# Patient Record
Sex: Female | Born: 1935
Health system: Southern US, Community
[De-identification: ages and names within clinical notes are randomized; demographics above are authoritative.]

## PROBLEM LIST (undated history)

## (undated) DIAGNOSIS — I10 Essential (primary) hypertension: Secondary | ICD-10-CM

## (undated) DIAGNOSIS — B029 Zoster without complications: Secondary | ICD-10-CM

## (undated) DIAGNOSIS — M1990 Unspecified osteoarthritis, unspecified site: Secondary | ICD-10-CM

## (undated) DIAGNOSIS — E785 Hyperlipidemia, unspecified: Secondary | ICD-10-CM

## (undated) DIAGNOSIS — D631 Anemia in chronic kidney disease: Secondary | ICD-10-CM

## (undated) DIAGNOSIS — N189 Chronic kidney disease, unspecified: Secondary | ICD-10-CM

## (undated) HISTORY — DX: Hyperlipidemia, unspecified: E78.5

## (undated) HISTORY — DX: Unspecified osteoarthritis, unspecified site: M19.90

## (undated) HISTORY — DX: Anemia in chronic kidney disease: D63.1

## (undated) HISTORY — DX: Zoster without complications: B02.9

## (undated) HISTORY — PX: CATARACT EXTRACTION: SUR2

## (undated) HISTORY — DX: Chronic kidney disease, unspecified: N18.9

## (undated) HISTORY — DX: Essential (primary) hypertension: I10

---

## 1997-10-31 ENCOUNTER — Ambulatory Visit (HOSPITAL_COMMUNITY): Admission: RE | Admit: 1997-10-31 | Discharge: 1997-10-31 | Payer: Self-pay | Admitting: Internal Medicine

## 1997-11-21 ENCOUNTER — Ambulatory Visit (HOSPITAL_COMMUNITY): Admission: RE | Admit: 1997-11-21 | Discharge: 1997-11-21 | Payer: Self-pay | Admitting: Internal Medicine

## 1997-11-21 ENCOUNTER — Encounter: Payer: Self-pay | Admitting: Internal Medicine

## 1998-11-28 ENCOUNTER — Ambulatory Visit (HOSPITAL_COMMUNITY): Admission: RE | Admit: 1998-11-28 | Discharge: 1998-11-28 | Payer: Self-pay | Admitting: Internal Medicine

## 1998-11-28 ENCOUNTER — Encounter: Payer: Self-pay | Admitting: Internal Medicine

## 1998-12-21 ENCOUNTER — Encounter: Payer: Self-pay | Admitting: Internal Medicine

## 1998-12-21 ENCOUNTER — Ambulatory Visit (HOSPITAL_COMMUNITY): Admission: RE | Admit: 1998-12-21 | Discharge: 1998-12-21 | Payer: Self-pay | Admitting: Internal Medicine

## 1999-12-26 ENCOUNTER — Encounter: Payer: Self-pay | Admitting: Internal Medicine

## 1999-12-26 ENCOUNTER — Encounter: Admission: RE | Admit: 1999-12-26 | Discharge: 1999-12-26 | Payer: Self-pay | Admitting: Internal Medicine

## 2000-07-28 ENCOUNTER — Other Ambulatory Visit: Admission: RE | Admit: 2000-07-28 | Discharge: 2000-07-28 | Payer: Self-pay | Admitting: Internal Medicine

## 2000-11-23 ENCOUNTER — Encounter: Admission: RE | Admit: 2000-11-23 | Discharge: 2000-12-24 | Payer: Self-pay | Admitting: Internal Medicine

## 2000-12-28 ENCOUNTER — Encounter: Payer: Self-pay | Admitting: Internal Medicine

## 2000-12-28 ENCOUNTER — Encounter: Admission: RE | Admit: 2000-12-28 | Discharge: 2000-12-28 | Payer: Self-pay | Admitting: Internal Medicine

## 2001-12-29 ENCOUNTER — Encounter: Admission: RE | Admit: 2001-12-29 | Discharge: 2001-12-29 | Payer: Self-pay | Admitting: Internal Medicine

## 2001-12-29 ENCOUNTER — Encounter: Payer: Self-pay | Admitting: Internal Medicine

## 2003-01-03 ENCOUNTER — Encounter: Payer: Self-pay | Admitting: Internal Medicine

## 2003-01-03 ENCOUNTER — Encounter: Admission: RE | Admit: 2003-01-03 | Discharge: 2003-01-03 | Payer: Self-pay | Admitting: Internal Medicine

## 2003-10-05 ENCOUNTER — Ambulatory Visit (HOSPITAL_COMMUNITY): Admission: RE | Admit: 2003-10-05 | Discharge: 2003-10-05 | Payer: Self-pay | Admitting: Internal Medicine

## 2004-01-18 ENCOUNTER — Encounter: Admission: RE | Admit: 2004-01-18 | Discharge: 2004-01-18 | Payer: Self-pay | Admitting: Internal Medicine

## 2004-06-12 ENCOUNTER — Ambulatory Visit: Payer: Self-pay | Admitting: Internal Medicine

## 2004-07-01 ENCOUNTER — Ambulatory Visit: Payer: Self-pay | Admitting: Internal Medicine

## 2004-07-23 ENCOUNTER — Ambulatory Visit: Payer: Self-pay | Admitting: Internal Medicine

## 2005-02-06 ENCOUNTER — Encounter: Admission: RE | Admit: 2005-02-06 | Discharge: 2005-02-06 | Payer: Self-pay | Admitting: Internal Medicine

## 2006-02-12 ENCOUNTER — Encounter: Admission: RE | Admit: 2006-02-12 | Discharge: 2006-02-12 | Payer: Self-pay | Admitting: Internal Medicine

## 2007-07-27 ENCOUNTER — Encounter: Admission: RE | Admit: 2007-07-27 | Discharge: 2007-07-27 | Payer: Self-pay | Admitting: Internal Medicine

## 2008-08-31 ENCOUNTER — Encounter: Admission: RE | Admit: 2008-08-31 | Discharge: 2008-08-31 | Payer: Self-pay | Admitting: Internal Medicine

## 2009-06-15 ENCOUNTER — Encounter (INDEPENDENT_AMBULATORY_CARE_PROVIDER_SITE_OTHER): Payer: Self-pay | Admitting: *Deleted

## 2010-01-17 ENCOUNTER — Encounter: Admission: RE | Admit: 2010-01-17 | Discharge: 2010-01-17 | Payer: Self-pay | Admitting: Internal Medicine

## 2010-04-30 NOTE — Letter (Signed)
Summary: Colonoscopy Letter  [facility]  [address] [place], [place] [address]   Phone: [phone]  Fax: [phone]      June 15, 2009 MRN: [id number]   Ann [name] [address] [place], [place]  [address]   Dear [name],   According to your medical record, it is time for you to schedule a Colonoscopy. The American Cancer Society recommends this procedure as a method to detect early colon cancer. Patients with a family history of colon cancer, or a personal history of colon polyps or inflammatory bowel disease are at increased risk.  This letter has beeen generated based on the recommendations made at the time of your procedure. If you feel that in your particular situation this may no longer apply, please contact our office.  Please call our office at [phone] to schedule this appointment or to update your records at your earliest convenience.  Thank you for cooperating with [place] to provide you with the very best care possible.   Sincerely,  Hedwig Morton. Juanda Chance, M.D.  [facility] [phone]

## 2010-05-13 ENCOUNTER — Telehealth: Payer: Self-pay | Admitting: Internal Medicine

## 2010-05-22 NOTE — Progress Notes (Signed)
Summary: Schedule Colonoscopy  Phone Note Outgoing Call Call back at Home Phone [phone]   Call placed by: Harlow Mares CMA Duncan Dull),  May 13, 2010 10:55 AM Call placed to: Patient Summary of Call: pt due for colonoscopy due to personal hx of colon polyps, offered an office visit to the pt and she declined both at this time.  Initial call taken by: Harlow Mares CMA (AAMA),  May 13, 2010 10:55 AM

## 2010-06-25 ENCOUNTER — Other Ambulatory Visit: Payer: Self-pay | Admitting: Oncology

## 2010-06-25 ENCOUNTER — Encounter (HOSPITAL_BASED_OUTPATIENT_CLINIC_OR_DEPARTMENT_OTHER): Payer: Medicare Other | Admitting: Oncology

## 2010-06-25 DIAGNOSIS — N189 Chronic kidney disease, unspecified: Secondary | ICD-10-CM

## 2010-06-25 DIAGNOSIS — D649 Anemia, unspecified: Secondary | ICD-10-CM

## 2010-06-25 LAB — COMPREHENSIVE METABOLIC PANEL
ALT: 18 U/L (ref 0–35)
AST: 25 U/L (ref 0–37)
Albumin: 4.5 g/dL (ref 3.5–5.2)
Alkaline Phosphatase: 62 U/L (ref 39–117)
BUN: 24 mg/dL — ABNORMAL HIGH (ref 6–23)
CO2: 29 mEq/L (ref 19–32)
Calcium: 9.9 mg/dL (ref 8.4–10.5)
Chloride: 102 mEq/L (ref 96–112)
Creatinine, Ser: 1.23 mg/dL — ABNORMAL HIGH (ref 0.40–1.20)
Glucose, Bld: 118 mg/dL — ABNORMAL HIGH (ref 70–99)
Potassium: 4.3 mEq/L (ref 3.5–5.3)
Sodium: 139 mEq/L (ref 135–145)
Total Protein: 7.5 g/dL (ref 6.0–8.3)

## 2010-06-25 LAB — CBC & DIFF AND RETIC
BASO%: 0.4 % (ref 0.0–2.0)
EOS%: 1.4 % (ref 0.0–7.0)
Eosinophils Absolute: 0.1 10*3/uL (ref 0.0–0.5)
HCT: 35.1 % (ref 34.8–46.6)
Immature Retic Fract: 5.3 % (ref 0.00–10.70)
LYMPH%: 36.5 % (ref 14.0–49.7)
MCH: 27.5 pg (ref 25.1–34.0)
MCHC: 32.5 g/dL (ref 31.5–36.0)
MCV: 84.6 fL (ref 79.5–101.0)
MONO#: 0.4 10*3/uL (ref 0.1–0.9)
MONO%: 8.4 % (ref 0.0–14.0)
NEUT#: 2.7 10*3/uL (ref 1.5–6.5)
NEUT%: 53.3 % (ref 38.4–76.8)
Platelets: 190 10*3/uL (ref 145–400)
RDW: 14.9 % — ABNORMAL HIGH (ref 11.2–14.5)
Retic %: 0.76 % (ref 0.50–1.50)
Retic Ct Abs: 31.54 10*3/uL (ref 18.30–72.70)
WBC: 5.1 10*3/uL (ref 3.9–10.3)
lymph#: 1.9 10*3/uL (ref 0.9–3.3)

## 2010-06-25 LAB — MORPHOLOGY: PLT EST: ADEQUATE

## 2010-06-25 LAB — CHCC SMEAR

## 2010-06-27 ENCOUNTER — Other Ambulatory Visit: Payer: Self-pay | Admitting: Oncology

## 2010-06-27 LAB — FERRITIN: Ferritin: 93 ng/mL (ref 10–291)

## 2010-06-27 LAB — BETA 2 MICROGLOBULIN, SERUM: Beta-2 Microglobulin: 2.39 mg/L — ABNORMAL HIGH (ref 1.01–1.73)

## 2010-06-27 LAB — IRON AND TIBC
Iron: 70 ug/dL (ref 42–145)
UIBC: 281 ug/dL

## 2010-07-01 ENCOUNTER — Other Ambulatory Visit: Payer: Self-pay | Admitting: Oncology

## 2010-07-01 LAB — UIFE/LIGHT CHAINS/TP QN, 24-HR UR
Albumin, U: DETECTED
Alpha 1, Urine: DETECTED — AB
Alpha 2, Urine: DETECTED — AB
Beta, Urine: DETECTED — AB
Free Kappa Lt Chains,Ur: 0.18 mg/dL (ref 0.04–1.51)
Free Lambda Lt Chains,Ur: <0.04 mg/dL — ABNORMAL LOW (ref 0.08–1.01)
Free Lt Chn Excr Rate: 2.7 mg/d
Time: 24 hours
Total Protein, Urine-Ur/day: 11 mg/d (ref 10–140)
Volume, Urine: 1500 mL

## 2010-07-01 LAB — CREATININE CLEARANCE, URINE, 24 HOUR
Collection Interval-CRCL: 24 hours
Creatinine Clearance: 57 mL/min — ABNORMAL LOW (ref 75–115)
Creatinine, 24H Ur: 1002 mg/d (ref 700–1800)
Creatinine, Urine: 66.8 mg/dL
Creatinine: 1.23 mg/dL — ABNORMAL HIGH (ref 0.40–1.20)
Urine Total Volume-CRCL: 1500 mL

## 2010-07-04 LAB — PROTEIN ELECTROPHORESIS, SERUM
Albumin ELP: 61.5 % (ref 55.8–66.1)
Alpha-1-Globulin: 4.5 % (ref 2.9–4.9)
Alpha-2-Globulin: 11.2 % (ref 7.1–11.8)
Beta 2: 4.1 % (ref 3.2–6.5)
Beta Globulin: 7 % (ref 4.7–7.2)
Gamma Globulin: 11.7 % (ref 11.1–18.8)
Total Protein, Serum Electrophoresis: 7.3 g/dL (ref 6.0–8.3)

## 2010-09-25 ENCOUNTER — Encounter (HOSPITAL_BASED_OUTPATIENT_CLINIC_OR_DEPARTMENT_OTHER): Payer: Medicare Other | Admitting: Oncology

## 2010-09-25 ENCOUNTER — Other Ambulatory Visit: Payer: Self-pay | Admitting: Oncology

## 2010-09-25 DIAGNOSIS — N189 Chronic kidney disease, unspecified: Secondary | ICD-10-CM

## 2010-09-25 DIAGNOSIS — D649 Anemia, unspecified: Secondary | ICD-10-CM

## 2010-09-25 LAB — CBC WITH DIFFERENTIAL/PLATELET
BASO%: 0.3 % (ref 0.0–2.0)
HCT: 32.9 % — ABNORMAL LOW (ref 34.8–46.6)
HGB: 10.8 g/dL — ABNORMAL LOW (ref 11.6–15.9)
LYMPH%: 31.5 % (ref 14.0–49.7)
MCH: 28.3 pg (ref 25.1–34.0)
MCHC: 32.9 g/dL (ref 31.5–36.0)
MCV: 85.9 fL (ref 79.5–101.0)
MONO#: 0.3 10*3/uL (ref 0.1–0.9)
MONO%: 6.6 % (ref 0.0–14.0)
NEUT%: 60.4 % (ref 38.4–76.8)
Platelets: 164 10*3/uL (ref 145–400)
RBC: 3.83 10*6/uL (ref 3.70–5.45)
WBC: 5.1 10*3/uL (ref 3.9–10.3)
lymph#: 1.6 10*3/uL (ref 0.9–3.3)

## 2010-12-30 ENCOUNTER — Other Ambulatory Visit: Payer: Self-pay | Admitting: Oncology

## 2010-12-30 ENCOUNTER — Encounter (HOSPITAL_BASED_OUTPATIENT_CLINIC_OR_DEPARTMENT_OTHER): Payer: Medicare Other | Admitting: Oncology

## 2010-12-30 DIAGNOSIS — D649 Anemia, unspecified: Secondary | ICD-10-CM

## 2010-12-30 DIAGNOSIS — N189 Chronic kidney disease, unspecified: Secondary | ICD-10-CM

## 2010-12-30 LAB — CBC WITH DIFFERENTIAL/PLATELET
BASO%: 0.4 % (ref 0.0–2.0)
EOS%: 1.7 % (ref 0.0–7.0)
Eosinophils Absolute: 0.1 10*3/uL (ref 0.0–0.5)
HCT: 32.6 % — ABNORMAL LOW (ref 34.8–46.6)
HGB: 11 g/dL — ABNORMAL LOW (ref 11.6–15.9)
LYMPH%: 34.4 % (ref 14.0–49.7)
MCH: 28.6 pg (ref 25.1–34.0)
MCHC: 33.7 g/dL (ref 31.5–36.0)
MCV: 85.1 fL (ref 79.5–101.0)
MONO%: 8 % (ref 0.0–14.0)
NEUT%: 55.5 % (ref 38.4–76.8)
Platelets: 182 10*3/uL (ref 145–400)
RBC: 3.83 10*6/uL (ref 3.70–5.45)
RDW: 15.3 % — ABNORMAL HIGH (ref 11.2–14.5)
WBC: 4.3 10*3/uL (ref 3.9–10.3)
lymph#: 1.5 10*3/uL (ref 0.9–3.3)

## 2010-12-30 LAB — COMPREHENSIVE METABOLIC PANEL
ALT: 13 U/L (ref 0–35)
AST: 21 U/L (ref 0–37)
BUN: 32 mg/dL — ABNORMAL HIGH (ref 6–23)
CO2: 28 mEq/L (ref 19–32)
Calcium: 10 mg/dL (ref 8.4–10.5)
Chloride: 104 mEq/L (ref 96–112)
Creatinine, Ser: 1.41 mg/dL — ABNORMAL HIGH (ref 0.50–1.10)
Glucose, Bld: 101 mg/dL — ABNORMAL HIGH (ref 70–99)
Potassium: 5 mEq/L (ref 3.5–5.3)
Sodium: 140 mEq/L (ref 135–145)
Total Bilirubin: 0.5 mg/dL (ref 0.3–1.2)
Total Protein: 7 g/dL (ref 6.0–8.3)

## 2011-01-09 ENCOUNTER — Other Ambulatory Visit: Payer: Self-pay | Admitting: Internal Medicine

## 2011-01-09 DIAGNOSIS — Z1231 Encounter for screening mammogram for malignant neoplasm of breast: Secondary | ICD-10-CM

## 2011-02-19 ENCOUNTER — Ambulatory Visit
Admission: RE | Admit: 2011-02-19 | Discharge: 2011-02-19 | Disposition: A | Discharge status: Home / Self Care | Payer: Medicare Other | Source: Ambulatory Visit | Attending: Internal Medicine | Admitting: Internal Medicine

## 2011-02-19 DIAGNOSIS — Z1231 Encounter for screening mammogram for malignant neoplasm of breast: Secondary | ICD-10-CM

## 2011-03-17 ENCOUNTER — Other Ambulatory Visit (HOSPITAL_BASED_OUTPATIENT_CLINIC_OR_DEPARTMENT_OTHER): Payer: Medicare Other | Admitting: Lab

## 2011-03-17 ENCOUNTER — Other Ambulatory Visit: Payer: Self-pay | Admitting: Oncology

## 2011-03-17 DIAGNOSIS — D649 Anemia, unspecified: Secondary | ICD-10-CM

## 2011-03-17 DIAGNOSIS — N189 Chronic kidney disease, unspecified: Secondary | ICD-10-CM

## 2011-03-17 LAB — CBC WITH DIFFERENTIAL/PLATELET
BASO%: 0.6 % (ref 0.0–2.0)
Basophils Absolute: 0 10*3/uL (ref 0.0–0.1)
EOS%: 1.1 % (ref 0.0–7.0)
Eosinophils Absolute: 0.1 10*3/uL (ref 0.0–0.5)
HCT: 33.6 % — ABNORMAL LOW (ref 34.8–46.6)
HGB: 11.2 g/dL — ABNORMAL LOW (ref 11.6–15.9)
LYMPH%: 36.3 % (ref 14.0–49.7)
MCH: 28.4 pg (ref 25.1–34.0)
MCHC: 33.4 g/dL (ref 31.5–36.0)
MCV: 84.8 fL (ref 79.5–101.0)
MONO#: 0.4 10*3/uL (ref 0.1–0.9)
MONO%: 7.9 % (ref 0.0–14.0)
NEUT#: 2.6 10*3/uL (ref 1.5–6.5)
NEUT%: 54.1 % (ref 38.4–76.8)
Platelets: 168 10*3/uL (ref 145–400)
RBC: 3.96 10*6/uL (ref 3.70–5.45)
RDW: 15.3 % — ABNORMAL HIGH (ref 11.2–14.5)
WBC: 4.7 10*3/uL (ref 3.9–10.3)
lymph#: 1.7 10*3/uL (ref 0.9–3.3)

## 2011-03-20 NOTE — Progress Notes (Signed)
Called patient to inform her that her Hgb is stable, per Dr. Gaylyn Rong.

## 2011-03-29 ENCOUNTER — Telehealth: Payer: Self-pay | Admitting: Oncology

## 2011-03-29 NOTE — Telephone Encounter (Signed)
Not able to reach pt by phone or lm. Mailed pt appt schedule for jan thru July today.

## 2011-04-10 ENCOUNTER — Other Ambulatory Visit: Payer: Medicare Other | Admitting: Lab

## 2011-05-27 ENCOUNTER — Encounter: Payer: Self-pay | Admitting: *Deleted

## 2011-06-05 ENCOUNTER — Encounter: Payer: Self-pay | Admitting: Oncology

## 2011-06-05 DIAGNOSIS — N183 Chronic kidney disease, stage 3 unspecified: Secondary | ICD-10-CM | POA: Insufficient documentation

## 2011-06-05 DIAGNOSIS — I10 Essential (primary) hypertension: Secondary | ICD-10-CM | POA: Insufficient documentation

## 2011-06-05 DIAGNOSIS — D631 Anemia in chronic kidney disease: Secondary | ICD-10-CM | POA: Insufficient documentation

## 2011-06-05 DIAGNOSIS — M1990 Unspecified osteoarthritis, unspecified site: Secondary | ICD-10-CM | POA: Insufficient documentation

## 2011-06-05 DIAGNOSIS — E559 Vitamin D deficiency, unspecified: Secondary | ICD-10-CM | POA: Insufficient documentation

## 2011-06-05 DIAGNOSIS — E782 Mixed hyperlipidemia: Secondary | ICD-10-CM | POA: Insufficient documentation

## 2011-06-11 ENCOUNTER — Telehealth: Payer: Self-pay | Admitting: Oncology

## 2011-06-11 ENCOUNTER — Ambulatory Visit (HOSPITAL_BASED_OUTPATIENT_CLINIC_OR_DEPARTMENT_OTHER): Payer: Medicare Other | Admitting: Oncology

## 2011-06-11 ENCOUNTER — Other Ambulatory Visit (HOSPITAL_BASED_OUTPATIENT_CLINIC_OR_DEPARTMENT_OTHER): Payer: Medicare Other | Admitting: Lab

## 2011-06-11 VITALS — BP 122/60 | HR 59 | Temp 97.2°F | Ht 62.0 in | Wt 117.8 lb

## 2011-06-11 DIAGNOSIS — D649 Anemia, unspecified: Secondary | ICD-10-CM

## 2011-06-11 DIAGNOSIS — E785 Hyperlipidemia, unspecified: Secondary | ICD-10-CM

## 2011-06-11 DIAGNOSIS — I10 Essential (primary) hypertension: Secondary | ICD-10-CM

## 2011-06-11 DIAGNOSIS — N189 Chronic kidney disease, unspecified: Secondary | ICD-10-CM

## 2011-06-11 DIAGNOSIS — M1990 Unspecified osteoarthritis, unspecified site: Secondary | ICD-10-CM

## 2011-06-11 DIAGNOSIS — D631 Anemia in chronic kidney disease: Secondary | ICD-10-CM

## 2011-06-11 LAB — CBC WITH DIFFERENTIAL/PLATELET
BASO%: 0.7 % (ref 0.0–2.0)
HCT: 32.9 % — ABNORMAL LOW (ref 34.8–46.6)
HGB: 10.8 g/dL — ABNORMAL LOW (ref 11.6–15.9)
MCHC: 32.8 g/dL (ref 31.5–36.0)
MONO#: 0.4 10*3/uL (ref 0.1–0.9)
NEUT#: 2.9 10*3/uL (ref 1.5–6.5)
NEUT%: 62.1 % (ref 38.4–76.8)
WBC: 4.6 10*3/uL (ref 3.9–10.3)
lymph#: 1.3 10*3/uL (ref 0.9–3.3)

## 2011-06-11 LAB — COMPREHENSIVE METABOLIC PANEL
ALT: 13 U/L (ref 0–35)
CO2: 26 mEq/L (ref 19–32)
Calcium: 9.7 mg/dL (ref 8.4–10.5)
Chloride: 100 mEq/L (ref 96–112)
Creatinine, Ser: 1.35 mg/dL — ABNORMAL HIGH (ref 0.50–1.10)
Sodium: 137 mEq/L (ref 135–145)
Total Protein: 7.1 g/dL (ref 6.0–8.3)

## 2011-06-11 NOTE — Progress Notes (Signed)
[facility] OFFICE PROGRESS NOTE  Cc:  Nadean Corwin, MD, MD  DIAGNOSIS:  Normocytic anemia from dyserythropoiesis and chronic renal insufficiency.  CURRENT THERAPY:  watchful observation.  INTERVAL HISTORY: Ann [name] 78 y.o. female returns for regular follow up.  She reports feeling well.  She denies any symptom of anemia.  She denies visible source of bleeding, SOB, palpitation, CP, dizziness.  Patient denies fatigue, headache, visual changes, confusion, drenching night sweats, palpable lymph node swelling, mucositis, odynophagia, dysphagia, nausea vomiting, jaundice, chest pain, palpitation, shortness of breath, dyspnea on exertion, productive cough, gum bleeding, epistaxis, hematemesis, hemoptysis, abdominal pain, abdominal swelling, early satiety, melena, hematochezia, hematuria, skin rash, spontaneous bleeding, joint swelling, joint pain, heat or cold intolerance, bowel bladder incontinence, back pain, focal motor weakness, paresthesia, depression, suicidal or homocidal ideation, feeling hopelessness.   Past Medical History  Diagnosis Date  . HTN (hypertension)   . Hyperlipidemia   . Vitamin d deficiency   . Degenerative joint disease   . Shingles   . Chronic renal insufficiency   . Anemia of chronic renal failure     No past surgical history on file.  Current Outpatient Prescriptions  Medication Sig Dispense Refill  . aspirin 81 MG tablet Take 81 mg by mouth daily.      Marland Kitchen atorvastatin (LIPITOR) 80 MG tablet Take 80 mg by mouth daily.      . Cholecalciferol (VITAMIN D3) 2000 UNITS TABS Take by mouth.      . docusate sodium (COLACE) 100 MG capsule Take 100 mg by mouth 2 (two) times daily.      . enalapril (VASOTEC) 20 MG tablet Take 20 mg by mouth daily.      . ferrous sulfate 325 (65 FE) MG tablet Take 325 mg by mouth daily with breakfast.      . fish oil-omega-3 fatty acids 1000 MG capsule Take 2 g by mouth daily.      . Flaxseed, Linseed, 1200  MG CAPS Take 1,200 mg by mouth 2 (two) times daily.      . furosemide (LASIX) 20 MG tablet Take 20 mg by mouth daily. 1/2 tablet      . ibuprofen (ADVIL) 200 MG tablet Take 200 mg by mouth every 6 (six) hours as needed.      . Magnesium 500 MG TABS Take by mouth daily.      . Multiple Vitamins-Minerals (CENTRUM SILVER PO) Take by mouth daily.      . vitamin B-12 (CYANOCOBALAMIN) 250 MCG tablet Take 250 mcg by mouth daily.        ALLERGIES:   has no known allergies.  REVIEW OF SYSTEMS:  The rest of the 14-point review of system was negative.   Filed Vitals:   06/11/11 1129  BP: 122/60  Pulse: 59  Temp: 97.2 F (36.2 C)   Wt Readings from Last 3 Encounters:  06/11/11 117 lb 12.8 oz (53.434 kg)  12/30/10 117 lb 3.2 oz (53.162 kg)   ECOG Performance status: 0  PHYSICAL EXAMINATION:   General:  Thin-appearing woman in no acute distress.  Eyes:  no scleral icterus.  ENT:  There were no oropharyngeal lesions.  Neck was without thyromegaly.  Lymphatics:  Negative cervical, supraclavicular or axillary adenopathy.  Respiratory: lungs were clear bilaterally without wheezing or crackles.  Cardiovascular:  Regular rate and rhythm, S1/S2, without murmur, rub or gallop.  There was no pedal edema.  GI:  abdomen was soft, flat, nontender, nondistended, without organomegaly.  Muscoloskeletal:  no spinal tenderness of palpation of vertebral spine.  Skin exam was without echymosis, petichae.  Neuro exam was nonfocal.  Patient was able to get on and off exam table without assistance.  Gait was normal.  Patient was alerted and oriented.  Attention was good.   Language was appropriate.  Mood was normal without depression.  Speech was not pressured.  Thought content was not tangential.     LABORATORY/RADIOLOGY DATA:  Lab Results  Component Value Date   WBC 4.6 06/11/2011   HGB 10.8* 06/11/2011   HCT 32.9* 06/11/2011   PLT 181 06/11/2011   GLUCOSE 101* 12/30/2010   GLUCOSE 101* 12/30/2010   ALT 13 12/30/2010    ALT 13 12/30/2010   AST 21 12/30/2010   AST 21 12/30/2010   NA 140 12/30/2010   NA 140 12/30/2010   K 5.0 12/30/2010   K 5.0 12/30/2010   CL 104 12/30/2010   CL 104 12/30/2010   CREATININE 1.41* 12/30/2010   CREATININE 1.41* 12/30/2010   BUN 32* 12/30/2010   BUN 32* 12/30/2010   CO2 28 12/30/2010   CO2 28 12/30/2010    ASSESSMENT AND PLAN:   1. Hypertension well controlled on enalapril per primary care physician. 2. Hyperlipidemia.  She is on atorvastatin per primary care physician. 3. Chronic renal insufficiency most likely secondary to her history of chronic hypertension.  She was ruled out for myeloma in the past.  4. Chronic normocytic anemia.  Her Hgb today is stable.  This is most likely again due to anemia of CKD.  There is no indication for pRBC transfusion.  In the future, if she requires frequent pRBC transfusion, then we may consider Aranesp.   5. Follow up:  Lab-only appointment in about 6 months.  RV with me in about 1 year.   The length of time of the face-to-face encounter was 10 minutes. More than 50% of time was spent counseling and coordination of care.

## 2011-06-11 NOTE — Telephone Encounter (Signed)
appts made and printed for pt aom °

## 2011-07-10 ENCOUNTER — Other Ambulatory Visit: Payer: Medicare Other | Admitting: Lab

## 2011-10-06 ENCOUNTER — Other Ambulatory Visit: Payer: Medicare Other

## 2011-10-06 ENCOUNTER — Ambulatory Visit: Payer: Medicare Other | Admitting: Oncology

## 2011-12-11 ENCOUNTER — Telehealth: Payer: Self-pay | Admitting: *Deleted

## 2011-12-11 ENCOUNTER — Other Ambulatory Visit (HOSPITAL_BASED_OUTPATIENT_CLINIC_OR_DEPARTMENT_OTHER): Payer: Medicare Other | Admitting: Lab

## 2011-12-11 DIAGNOSIS — M1990 Unspecified osteoarthritis, unspecified site: Secondary | ICD-10-CM

## 2011-12-11 DIAGNOSIS — D631 Anemia in chronic kidney disease: Secondary | ICD-10-CM

## 2011-12-11 DIAGNOSIS — N189 Chronic kidney disease, unspecified: Secondary | ICD-10-CM

## 2011-12-11 DIAGNOSIS — I10 Essential (primary) hypertension: Secondary | ICD-10-CM

## 2011-12-11 DIAGNOSIS — E785 Hyperlipidemia, unspecified: Secondary | ICD-10-CM

## 2011-12-11 LAB — CBC WITH DIFFERENTIAL/PLATELET
BASO%: 0.8 % (ref 0.0–2.0)
MCHC: 33.1 g/dL (ref 31.5–36.0)
MONO#: 0.4 10*3/uL (ref 0.1–0.9)
RBC: 3.94 10*6/uL (ref 3.70–5.45)
RDW: 14.7 % — ABNORMAL HIGH (ref 11.2–14.5)
WBC: 4.2 10*3/uL (ref 3.9–10.3)
lymph#: 1.5 10*3/uL (ref 0.9–3.3)

## 2011-12-11 NOTE — Telephone Encounter (Signed)
Called pt w/ results of CBC, anemia stable and keep next appt in March 2014 as scheduled.  Pt verbalized understanding.

## 2011-12-11 NOTE — Telephone Encounter (Signed)
Message copied by Wende Mott on Thu Dec 11, 2011  3:25 PM ------      Message from: HA, Raliegh Ip T      Created: Thu Dec 11, 2011  9:07 AM       Please call patient. Her anemia of chronic kidney disease is stable. I again recommend observation.

## 2012-03-03 ENCOUNTER — Other Ambulatory Visit: Payer: Self-pay | Admitting: Internal Medicine

## 2012-03-03 DIAGNOSIS — Z1231 Encounter for screening mammogram for malignant neoplasm of breast: Secondary | ICD-10-CM

## 2012-04-19 ENCOUNTER — Ambulatory Visit
Admission: RE | Admit: 2012-04-19 | Discharge: 2012-04-19 | Disposition: A | Discharge status: Home / Self Care | Payer: Medicare Other | Source: Ambulatory Visit | Attending: Internal Medicine | Admitting: Internal Medicine

## 2012-04-19 DIAGNOSIS — Z1231 Encounter for screening mammogram for malignant neoplasm of breast: Secondary | ICD-10-CM

## 2012-04-19 LAB — HM MAMMOGRAPHY: HM MAMMO: NEGATIVE

## 2012-05-20 ENCOUNTER — Telehealth: Payer: Self-pay | Admitting: Oncology

## 2012-05-20 NOTE — Telephone Encounter (Signed)
no answer,...mailed pt updated appt schedule for March

## 2012-06-11 ENCOUNTER — Ambulatory Visit: Payer: Medicare Other | Admitting: Oncology

## 2012-06-11 ENCOUNTER — Other Ambulatory Visit: Payer: Medicare Other | Admitting: Lab

## 2012-06-24 ENCOUNTER — Other Ambulatory Visit: Payer: Self-pay | Admitting: Oncology

## 2012-06-24 DIAGNOSIS — D631 Anemia in chronic kidney disease: Secondary | ICD-10-CM

## 2012-06-24 DIAGNOSIS — N189 Chronic kidney disease, unspecified: Secondary | ICD-10-CM

## 2012-06-25 ENCOUNTER — Telehealth: Payer: Self-pay | Admitting: Oncology

## 2012-06-25 ENCOUNTER — Other Ambulatory Visit (HOSPITAL_BASED_OUTPATIENT_CLINIC_OR_DEPARTMENT_OTHER): Payer: Medicare Other | Admitting: Lab

## 2012-06-25 ENCOUNTER — Ambulatory Visit (HOSPITAL_BASED_OUTPATIENT_CLINIC_OR_DEPARTMENT_OTHER): Payer: Medicare Other | Admitting: Oncology

## 2012-06-25 VITALS — BP 147/68 | HR 63 | Temp 97.1°F | Resp 18 | Ht 62.0 in | Wt 113.1 lb

## 2012-06-25 DIAGNOSIS — N189 Chronic kidney disease, unspecified: Secondary | ICD-10-CM

## 2012-06-25 DIAGNOSIS — E785 Hyperlipidemia, unspecified: Secondary | ICD-10-CM

## 2012-06-25 DIAGNOSIS — D631 Anemia in chronic kidney disease: Secondary | ICD-10-CM

## 2012-06-25 DIAGNOSIS — D649 Anemia, unspecified: Secondary | ICD-10-CM

## 2012-06-25 DIAGNOSIS — I10 Essential (primary) hypertension: Secondary | ICD-10-CM

## 2012-06-25 LAB — COMPREHENSIVE METABOLIC PANEL (CC13)
ALT: 10 U/L (ref 0–55)
AST: 18 U/L (ref 5–34)
Albumin: 3.9 g/dL (ref 3.5–5.0)
Alkaline Phosphatase: 66 U/L (ref 40–150)
Glucose: 94 mg/dl (ref 70–99)
Potassium: 4.3 mEq/L (ref 3.5–5.1)
Sodium: 140 mEq/L (ref 136–145)
Total Protein: 6.8 g/dL (ref 6.4–8.3)

## 2012-06-25 LAB — CBC WITH DIFFERENTIAL/PLATELET
BASO%: 0.9 % (ref 0.0–2.0)
Eosinophils Absolute: 0.1 10*3/uL (ref 0.0–0.5)
MCV: 85.3 fL (ref 79.5–101.0)
MONO%: 8.4 % (ref 0.0–14.0)
NEUT#: 2.5 10*3/uL (ref 1.5–6.5)
RBC: 4.17 10*6/uL (ref 3.70–5.45)
RDW: 14.1 % (ref 11.2–14.5)
WBC: 4.4 10*3/uL (ref 3.9–10.3)

## 2012-06-25 NOTE — Progress Notes (Signed)
[facility] OFFICE PROGRESS NOTE  Cc:  MCKEOWN,WILLIAM DAVID, MD  DIAGNOSIS:  Normocytic anemia from dyserythropoiesis and chronic renal insufficiency.  CURRENT THERAPY:  watchful observation.  INTERVAL HISTORY: Ann [name] 79 y.o. female returns for regular follow up.  She reports feeling great.  She denied SOB, DOE, visible source of bleeding.   The rest of the 14-point review of system was negative.   Past Medical History  Diagnosis Date  . HTN (hypertension)   . Hyperlipidemia   . Vitamin d deficiency   . Degenerative joint disease   . Shingles   . Chronic renal insufficiency   . Anemia of chronic renal failure     No past surgical history on file.  Current Outpatient Prescriptions  Medication Sig Dispense Refill  . aspirin 81 MG tablet Take 81 mg by mouth daily.      . Biotin 2500 MCG CAPS Take by mouth daily.      . Cholecalciferol (VITAMIN D3) 2000 UNITS TABS Take by mouth.      . enalapril (VASOTEC) 20 MG tablet Take 10 mg by mouth daily.       . ferrous sulfate 325 (65 FE) MG tablet Take 180 mg by mouth daily with breakfast.       . fish oil-omega-3 fatty acids 1000 MG capsule Take 1 g by mouth daily. 1400 mg      . simvastatin (ZOCOR) 40 MG tablet Take 40 mg by mouth every evening.       No current facility-administered medications for this visit.    ALLERGIES:  has No Known Allergies.  REVIEW OF SYSTEMS:  The rest of the 14-point review of system was negative.   Filed Vitals:   06/25/12 0940  BP: 147/68  Pulse: 63  Temp: 97.1 F (36.2 C)  Resp: 18   Wt Readings from Last 3 Encounters:  06/25/12 113 lb 1.6 oz (51.302 kg)  06/11/11 117 lb 12.8 oz (53.434 kg)  12/30/10 117 lb 3.2 oz (53.162 kg)   ECOG Performance status: 0  PHYSICAL EXAMINATION:   General:  Thin-appearing woman in no acute distress.  Eyes:  no scleral icterus.  ENT:  There were no oropharyngeal lesions.  Neck was without thyromegaly.  Lymphatics:  Negative  cervical, supraclavicular or axillary adenopathy.  Respiratory: lungs were clear bilaterally without wheezing or crackles.  Cardiovascular:  Regular rate and rhythm, S1/S2, without murmur, rub or gallop.  There was no pedal edema.  GI:  abdomen was soft, flat, nontender, nondistended, without organomegaly.  Muscoloskeletal:  no spinal tenderness of palpation of vertebral spine.  Skin exam was without echymosis, petichae.  Neuro exam was nonfocal.  Patient was able to get on and off exam table without assistance.  Gait was normal.  Patient was alerted and oriented.  Attention was good.   Language was appropriate.  Mood was normal without depression.  Speech was not pressured.  Thought content was not tangential.     LABORATORY/RADIOLOGY DATA:  Lab Results  Component Value Date   WBC 4.4 06/25/2012   HGB 11.5* 06/25/2012   HCT 35.5 06/25/2012   PLT 150 06/25/2012   GLUCOSE 94 06/25/2012   ALT 10 06/25/2012   AST 18 06/25/2012   NA 140 06/25/2012   K 4.3 06/25/2012   CL 105 06/25/2012   CREATININE 1.2* 06/25/2012   BUN 20.3 06/25/2012   CO2 25 06/25/2012    ASSESSMENT AND PLAN:   1. Hypertension:  On enalapril 2. Hyperlipidemia.  She is on simvastatin and fish oil. 3. Chronic renal insufficiency most likely secondary to her history of chronic hypertension.  She was ruled out for myeloma in the past. Her kidney function has been stable. 4. Chronic normocytic anemia.  Most like be due to chronic kidney disease. Past workup was negative including multiple myeloma. There is no indication for pRBC transfusion.  In the future, if she requires frequent pRBC transfusion, then we may consider Aranesp.   5. Follow up:  Lab-only appointment in about 6 months.  Return visit in about 1 year.   The length of time of the face-to-face encounter was 10 minutes. More than 50% of time was spent counseling and coordination of care.

## 2012-06-25 NOTE — Patient Instructions (Addendum)
1.  diagnosis: Anemia of chronic kidney disease. 2.  status: Stable. 3.  recommendation: Observation with routine followup of blood counts. Return visit in one year. In the future, if anemia significantly worsens or also developed low white count and platelet count, we will consider diagnostic bone marrow biopsy.

## 2012-08-04 ENCOUNTER — Encounter: Payer: Medicare Other | Admitting: Internal Medicine

## 2012-12-24 ENCOUNTER — Other Ambulatory Visit (HOSPITAL_BASED_OUTPATIENT_CLINIC_OR_DEPARTMENT_OTHER): Payer: Medicare Other

## 2012-12-24 DIAGNOSIS — D631 Anemia in chronic kidney disease: Secondary | ICD-10-CM

## 2012-12-24 DIAGNOSIS — D649 Anemia, unspecified: Secondary | ICD-10-CM

## 2012-12-24 DIAGNOSIS — N189 Chronic kidney disease, unspecified: Secondary | ICD-10-CM

## 2012-12-24 LAB — CBC WITH DIFFERENTIAL/PLATELET
Basophils Absolute: 0 10*3/uL (ref 0.0–0.1)
Eosinophils Absolute: 0.1 10*3/uL (ref 0.0–0.5)
HCT: 36.5 % (ref 34.8–46.6)
HGB: 11.7 g/dL (ref 11.6–15.9)
MONO#: 0.4 10*3/uL (ref 0.1–0.9)
NEUT#: 2.2 10*3/uL (ref 1.5–6.5)
NEUT%: 56.2 % (ref 38.4–76.8)
WBC: 3.9 10*3/uL (ref 3.9–10.3)
lymph#: 1.2 10*3/uL (ref 0.9–3.3)

## 2012-12-31 ENCOUNTER — Telehealth: Payer: Self-pay | Admitting: *Deleted

## 2012-12-31 NOTE — Telephone Encounter (Signed)
Spoke to pt, she verbalized understanding.  SLJ 

## 2012-12-31 NOTE — Telephone Encounter (Signed)
Message copied by Caren Griffins on Fri Dec 31, 2012  4:15 PM ------      Message from: Centuria, Virginia E      Created: Fri Dec 31, 2012  3:51 PM       Will you please help            ----- Message -----         From: Myrtis Ser, NP         Sent: 12/31/2012  10:30 AM           To: Alvis Lemmings, RN            Please call pt. Hgb is normal. Keep f/u as scheduled. ------

## 2013-04-03 ENCOUNTER — Encounter: Payer: Self-pay | Admitting: Internal Medicine

## 2013-04-06 ENCOUNTER — Ambulatory Visit (INDEPENDENT_AMBULATORY_CARE_PROVIDER_SITE_OTHER): Payer: Medicare Other | Admitting: Emergency Medicine

## 2013-04-06 ENCOUNTER — Encounter: Payer: Self-pay | Admitting: Emergency Medicine

## 2013-04-06 VITALS — BP 122/60 | HR 62 | Temp 97.8°F | Resp 16 | Ht 62.0 in | Wt 111.0 lb

## 2013-04-06 DIAGNOSIS — J309 Allergic rhinitis, unspecified: Secondary | ICD-10-CM

## 2013-04-06 DIAGNOSIS — E559 Vitamin D deficiency, unspecified: Secondary | ICD-10-CM

## 2013-04-06 DIAGNOSIS — R7309 Other abnormal glucose: Secondary | ICD-10-CM

## 2013-04-06 DIAGNOSIS — I10 Essential (primary) hypertension: Secondary | ICD-10-CM

## 2013-04-06 DIAGNOSIS — E782 Mixed hyperlipidemia: Secondary | ICD-10-CM

## 2013-04-06 LAB — CBC WITH DIFFERENTIAL/PLATELET
Basophils Absolute: 0 10*3/uL (ref 0.0–0.1)
Basophils Relative: 1 % (ref 0–1)
EOS ABS: 0 10*3/uL (ref 0.0–0.7)
Eosinophils Relative: 1 % (ref 0–5)
HEMATOCRIT: 35.2 % — AB (ref 36.0–46.0)
HEMOGLOBIN: 11.7 g/dL — AB (ref 12.0–15.0)
Lymphocytes Relative: 25 % (ref 12–46)
Lymphs Abs: 1.1 10*3/uL (ref 0.7–4.0)
MCH: 27 pg (ref 26.0–34.0)
MCHC: 33.2 g/dL (ref 30.0–36.0)
MCV: 81.1 fL (ref 78.0–100.0)
MONO ABS: 0.3 10*3/uL (ref 0.1–1.0)
MONOS PCT: 7 % (ref 3–12)
NEUTROS ABS: 2.7 10*3/uL (ref 1.7–7.7)
Neutrophils Relative %: 66 % (ref 43–77)
Platelets: 151 10*3/uL (ref 150–400)
RBC: 4.34 MIL/uL (ref 3.87–5.11)
RDW: 15.2 % (ref 11.5–15.5)
WBC: 4.1 10*3/uL (ref 4.0–10.5)

## 2013-04-06 LAB — HEPATIC FUNCTION PANEL
ALBUMIN: 4.2 g/dL (ref 3.5–5.2)
ALK PHOS: 54 U/L (ref 39–117)
ALT: 9 U/L (ref 0–35)
AST: 20 U/L (ref 0–37)
Bilirubin, Direct: 0.1 mg/dL (ref 0.0–0.3)
Indirect Bilirubin: 0.3 mg/dL (ref 0.0–0.9)
TOTAL PROTEIN: 6.8 g/dL (ref 6.0–8.3)
Total Bilirubin: 0.4 mg/dL (ref 0.3–1.2)

## 2013-04-06 LAB — LIPID PANEL
CHOLESTEROL: 160 mg/dL (ref 0–200)
HDL: 48 mg/dL (ref >39)
LDL Cholesterol: 91 mg/dL (ref 0–99)
TRIGLYCERIDES: 106 mg/dL (ref <150)
Total CHOL/HDL Ratio: 3.3 Ratio
VLDL: 21 mg/dL (ref 0–40)

## 2013-04-06 LAB — BASIC METABOLIC PANEL WITH GFR
BUN: 20 mg/dL (ref 6–23)
CO2: 25 meq/L (ref 19–32)
Calcium: 9.4 mg/dL (ref 8.4–10.5)
Chloride: 104 mEq/L (ref 96–112)
Creat: 1.07 mg/dL (ref 0.50–1.10)
GFR, EST AFRICAN AMERICAN: 57 mL/min — AB
GFR, Est Non African American: 49 mL/min — ABNORMAL LOW
GLUCOSE: 97 mg/dL (ref 70–99)
Potassium: 5.9 mEq/L — ABNORMAL HIGH (ref 3.5–5.3)
SODIUM: 139 meq/L (ref 135–145)

## 2013-04-06 LAB — HEMOGLOBIN A1C
Hgb A1c MFr Bld: 6.1 % — ABNORMAL HIGH (ref <5.7)
Mean Plasma Glucose: 128 mg/dL — ABNORMAL HIGH (ref <117)

## 2013-04-06 MED ORDER — ALPRAZOLAM 1 MG PO TABS: 1.0000 mg | ORAL_TABLET | Freq: Three times a day (TID) | ORAL | Status: DC | PRN

## 2013-04-06 NOTE — Patient Instructions (Signed)
Allergic Rhinitis Allergic rhinitis is when the mucous membranes in the nose respond to allergens. Allergens are particles in the air that cause your body to have an allergic reaction. This causes you to release allergic antibodies. Through a chain of events, these eventually cause you to release histamine into the blood stream (hence the use of antihistamines). Although meant to be protective to the body, it is this release that causes your discomfort, such as frequent sneezing, congestion and an itchy runny nose.  CAUSES  The pollen allergens may come from grasses, trees, and weeds. This is seasonal allergic rhinitis, or "hay fever." Other allergens cause year-round allergic rhinitis (perennial allergic rhinitis) such as house dust mite allergen, pet dander and mold spores.  SYMPTOMS   Nasal stuffiness (congestion).  Runny, itchy nose with sneezing and tearing of the eyes.  There is often an itching of the mouth, eyes and ears. It cannot be cured, but it can be controlled with medications. DIAGNOSIS  If you are unable to determine the offending allergen, skin or blood testing may find it. TREATMENT   Avoid the allergen.  Medications and allergy shots (immunotherapy) can help.  Hay fever may often be treated with antihistamines in pill or nasal spray forms. Antihistamines block the effects of histamine. There are over-the-counter medicines that may help with nasal congestion and swelling around the eyes. Check with your caregiver before taking or giving this medicine. If the treatment above does not work, there are many new medications your caregiver can prescribe. Stronger medications may be used if initial measures are ineffective. Desensitizing injections can be used if medications and avoidance fails. Desensitization is when a patient is given ongoing shots until the body becomes less sensitive to the allergen. Make sure you follow up with your caregiver if problems continue. SEEK MEDICAL  CARE IF:   You develop fever (more than 100.5 F (38.1 C).  You develop a cough that does not stop easily (persistent).  You have shortness of breath.  You start wheezing.  Symptoms interfere with normal daily activities. Document Released: 12/10/2000 Document Revised: 06/09/2011 Document Reviewed: 06/21/2008 Exit[employer] Patient Information 2014 [employer].  

## 2013-04-06 NOTE — Progress Notes (Signed)
Subjective:    Patient ID: Ann [name], female    DOB: May 09, 1930, 80 y.o.   MRN: [id number]  HPI Comments: 80 YO female presents for 3 month F/U for HTN, Cholesterol, Pre-Dm, D. deficient LAST LABS T 189 TG 88 LDL 106 A1C 6.1 INSULIN 7 D 69 She is eating healthy. She keeps active.  She has been sick for over 1 week with cough and fatigue. She notes symptoms are starting to improve.   She notes she occasionally a little more anxious with  Mild stresses and it occasionally disturbs sleep. She uses Xanax to help without any adverse side effects. She wakes well rested and alert. She denies any recent falls or confusion.  Hypertension  Hyperlipidemia    Current Outpatient Prescriptions on File Prior to Visit  Medication Sig Dispense Refill  . ALPRAZolam (XANAX) 1 MG tablet Take 1 mg by mouth at bedtime.      Marland Kitchen. aspirin 81 MG tablet Take 81 mg by mouth daily.      . Biotin 2500 MCG CAPS Take by mouth daily.      . Cholecalciferol (VITAMIN D3) 2000 UNITS TABS Take by mouth.      . enalapril (VASOTEC) 20 MG tablet Take 10 mg by mouth daily.       . fish oil-omega-3 fatty acids 1000 MG capsule Take 1 g by mouth daily. 1400 mg      . simvastatin (ZOCOR) 40 MG tablet Take 40 mg by mouth every evening.       No current facility-administered medications on file prior to visit.   Review of patient's allergies indicates no known allergies.  Past Medical History  Diagnosis Date  . HTN (hypertension)   . Hyperlipidemia   . Vitamin D deficiency   . Degenerative joint disease   . Shingles   . Chronic renal insufficiency   . Anemia of chronic renal failure      Review of Systems  HENT: Positive for congestion and postnasal drip.   Psychiatric/Behavioral: Positive for sleep disturbance. The patient is nervous/anxious.   All other systems reviewed and are negative.   BP 122/60  Pulse 62  Temp(Src) 97.8 F (36.6 C) (Temporal)  Resp 16  Ht 5\' 2"  (1.575 m)  Wt 111 lb (50.349 kg)   BMI 20.30 kg/m2     Objective:   Physical Exam  Nursing note and vitals reviewed. Constitutional: She is oriented to person, place, and time. She appears well-developed and well-nourished. No distress.  HENT:  Head: Normocephalic and atraumatic.  Right Ear: External ear normal.  Left Ear: External ear normal.  Nose: Nose normal.  Mouth/Throat: Oropharynx is clear and moist. No oropharyngeal exudate.  Eyes: Conjunctivae and EOM are normal.  Neck: Normal range of motion. Neck supple. No JVD present. No thyromegaly present.  Cardiovascular: Normal rate, regular rhythm, normal heart sounds and intact distal pulses.   Pulmonary/Chest: Effort normal and breath sounds normal.  Abdominal: Soft. Bowel sounds are normal. She exhibits no distension and no mass. There is no tenderness. There is no rebound and no guarding.  Musculoskeletal: Normal range of motion. She exhibits no edema and no tenderness.  Lymphadenopathy:    She has no cervical adenopathy.  Neurological: She is alert and oriented to person, place, and time. No cranial nerve deficit.  Skin: Skin is warm and dry. No rash noted. No erythema. No pallor.  Psychiatric: She has a normal mood and affect. Her behavior is normal. Judgment and thought content  normal.          Assessment & Plan:  1.  3 month F/U for HTN, Cholesterol, Pre-Dm, D. Deficient. Needs healthy diet, cardio QD and obtain healthy weight. Check Labs, Check BP if >130/80 call office 2. Allergic rhinitis- Allegra OTC, increase H2o, allergy hygiene explained vs viral infection resolving,w/c if symptoms increase 3. Anxiety/ Insomnia occasional- Advised will increase RX to TID but should rarely use it more than once a day, she w/c if needs RX more than BID, sleep hygiene discussed

## 2013-04-07 LAB — INSULIN, FASTING: Insulin fasting, serum: 16 u[IU]/mL (ref 3–28)

## 2013-04-28 ENCOUNTER — Other Ambulatory Visit: Payer: Self-pay | Admitting: Internal Medicine

## 2013-05-03 ENCOUNTER — Other Ambulatory Visit: Payer: Self-pay | Admitting: Internal Medicine

## 2013-06-22 ENCOUNTER — Other Ambulatory Visit: Payer: Self-pay | Admitting: Hematology and Oncology

## 2013-06-22 DIAGNOSIS — N189 Chronic kidney disease, unspecified: Principal | ICD-10-CM

## 2013-06-22 DIAGNOSIS — D631 Anemia in chronic kidney disease: Secondary | ICD-10-CM

## 2013-06-23 ENCOUNTER — Other Ambulatory Visit (HOSPITAL_BASED_OUTPATIENT_CLINIC_OR_DEPARTMENT_OTHER): Payer: Medicare Other

## 2013-06-23 ENCOUNTER — Ambulatory Visit (HOSPITAL_BASED_OUTPATIENT_CLINIC_OR_DEPARTMENT_OTHER): Payer: Medicare Other | Admitting: Hematology and Oncology

## 2013-06-23 ENCOUNTER — Encounter: Payer: Self-pay | Admitting: Hematology and Oncology

## 2013-06-23 VITALS — BP 129/49 | HR 69 | Temp 98.1°F | Resp 18 | Ht 62.0 in | Wt 113.5 lb

## 2013-06-23 DIAGNOSIS — N189 Chronic kidney disease, unspecified: Secondary | ICD-10-CM

## 2013-06-23 DIAGNOSIS — D631 Anemia in chronic kidney disease: Secondary | ICD-10-CM

## 2013-06-23 DIAGNOSIS — D649 Anemia, unspecified: Secondary | ICD-10-CM

## 2013-06-23 LAB — CBC & DIFF AND RETIC
BASO%: 0.9 % (ref 0.0–2.0)
Basophils Absolute: 0 10*3/uL (ref 0.0–0.1)
EOS ABS: 0.1 10*3/uL (ref 0.0–0.5)
EOS%: 2 % (ref 0.0–7.0)
HEMATOCRIT: 35.6 % (ref 34.8–46.6)
HGB: 11.1 g/dL — ABNORMAL LOW (ref 11.6–15.9)
Immature Retic Fract: 6.8 % (ref 1.60–10.00)
LYMPH%: 25.9 % (ref 14.0–49.7)
MCH: 26.8 pg (ref 25.1–34.0)
MCHC: 31.2 g/dL — ABNORMAL LOW (ref 31.5–36.0)
MCV: 86 fL (ref 79.5–101.0)
MONO#: 0.3 10*3/uL (ref 0.1–0.9)
MONO%: 6.5 % (ref 0.0–14.0)
NEUT%: 64.7 % (ref 38.4–76.8)
NEUTROS ABS: 3 10*3/uL (ref 1.5–6.5)
PLATELETS: 182 10*3/uL (ref 145–400)
RBC: 4.14 10*6/uL (ref 3.70–5.45)
RDW: 14.8 % — ABNORMAL HIGH (ref 11.2–14.5)
Retic %: 1.15 % (ref 0.70–2.10)
Retic Ct Abs: 47.61 10*3/uL (ref 33.70–90.70)
WBC: 4.6 10*3/uL (ref 3.9–10.3)
lymph#: 1.2 10*3/uL (ref 0.9–3.3)

## 2013-06-23 NOTE — Progress Notes (Signed)
[facility] OFFICE PROGRESS NOTE  MCKEOWN,WILLIAM DAVID, MD DIAGNOSIS:  Chronic anemia, likely anemia of chronic disease from chronic kidney disease  SUMMARY OF HEMATOLOGIC HISTORY: This patient was referred here because of anemia chronic disease. Show chronic kidney disease, currently under observation. She had been placed on oral iron supplement last year but she is not currently taking it. INTERVAL HISTORY: Dekota Kirlin Verbeke 80 y.o. female returns for further followup. She denies any signs and symptoms of anemia. The patient is very active with all activities of daily living. The patient denies any recent signs or symptoms of bleeding such as spontaneous epistaxis, hematuria or hematochezia.  I have reviewed the past medical history, past surgical history, social history and family history with the patient and they are unchanged from previous note.  ALLERGIES:  has No Known Allergies.  MEDICATIONS:  Current Outpatient Prescriptions  Medication Sig Dispense Refill  . ALPRAZolam (XANAX) 1 MG tablet Take 1 tablet (1 mg total) by mouth 3 (three) times daily as needed for anxiety (for sleep and occasional anxiety).  90 tablet  0  . aspirin 81 MG tablet Take 81 mg by mouth daily.      . Biotin 2500 MCG CAPS Take by mouth daily.      . Cholecalciferol (VITAMIN D3) 2000 UNITS TABS Take by mouth.      Marland Kitchen CINNAMON PO Take 2,000 mg by mouth daily.      . enalapril (VASOTEC) 20 MG tablet Take one tablet by mouth every day  for blood pressure  90 tablet  0  . fish oil-omega-3 fatty acids 1000 MG capsule Take 1 g by mouth daily. 1400 mg      . simvastatin (ZOCOR) 40 MG tablet Take one tablet by mouth   nightly at bedtime  90 tablet  2   No current facility-administered medications for this visit.     REVIEW OF SYSTEMS:   Constitutional: Denies fevers, chills or night sweats Eyes: Denies blurriness of vision Ears, nose, mouth, throat, and face: Denies mucositis or sore  throat Respiratory: Denies cough, dyspnea or wheezes Cardiovascular: Denies palpitation, chest discomfort or lower extremity swelling Gastrointestinal:  Denies nausea, heartburn or change in bowel habits Skin: Denies abnormal skin rashes Lymphatics: Denies new lymphadenopathy or easy bruising Neurological:Denies numbness, tingling or new weaknesses Behavioral/Psych: Mood is stable, no new changes  All other systems were reviewed with the patient and are negative.  PHYSICAL EXAMINATION: ECOG PERFORMANCE STATUS: 0 - Asymptomatic  Filed Vitals:   06/23/13 0903  BP: 129/49  Pulse: 69  Temp: 98.1 F (36.7 C)  Resp: 18   Filed Weights   06/23/13 0903  Weight: 113 lb 8 oz (51.483 kg)    GENERAL:alert, no distress and comfortable SKIN: skin color, texture, turgor are normal, no rashes or significant lesions EYES: normal, Conjunctiva are pink and non-injected, sclera clear OROPHARYNX:no exudate, no erythema and lips, buccal mucosa, and tongue normal  NECK: supple, thyroid normal size, non-tender, without nodularity LYMPH:  no palpable lymphadenopathy in the cervical, axillary or inguinal LUNGS: clear to auscultation and percussion with normal breathing effort HEART: regular rate & rhythm and no murmurs and no lower extremity edema ABDOMEN:abdomen soft, non-tender and normal bowel sounds Musculoskeletal:no cyanosis of digits and no clubbing  NEURO: alert & oriented x 3 with fluent speech, no focal motor/sensory deficits  LABORATORY DATA:  I have reviewed the data as listed Results for orders placed in visit on 06/23/13 (from the past 48 hour(s))  CBC & DIFF AND RETIC     Status: Abnormal   Collection Time    06/23/13  8:52 AM      Result Value Ref Range   WBC 4.6  3.9 - 10.3 10e3/uL   NEUT# 3.0  1.5 - 6.5 10e3/uL   HGB 11.1 (*) 11.6 - 15.9 g/dL   HCT 16.135.6  09.634.8 - 04.546.6 %   Platelets 182  145 - 400 10e3/uL   MCV 86.0  79.5 - 101.0 fL   MCH 26.8  25.1 - 34.0 pg   MCHC 31.2 (*)  31.5 - 36.0 g/dL   RBC 4.094.14  8.113.70 - 9.145.45 10e6/uL   RDW 14.8 (*) 11.2 - 14.5 %   lymph# 1.2  0.9 - 3.3 10e3/uL   MONO# 0.3  0.1 - 0.9 10e3/uL   Eosinophils Absolute 0.1  0.0 - 0.5 10e3/uL   Basophils Absolute 0.0  0.0 - 0.1 10e3/uL   NEUT% 64.7  38.4 - 76.8 %   LYMPH% 25.9  14.0 - 49.7 %   MONO% 6.5  0.0 - 14.0 %   EOS% 2.0  0.0 - 7.0 %   BASO% 0.9  0.0 - 2.0 %   Retic % 1.15  0.70 - 2.10 %   Retic Ct Abs 47.61  33.70 - 90.70 10e3/uL   Immature Retic Fract 6.80  1.60 - 10.00 %    Lab Results  Component Value Date   WBC 4.6 06/23/2013   HGB 11.1* 06/23/2013   HCT 35.6 06/23/2013   MCV 86.0 06/23/2013   PLT 182 06/23/2013   ASSESSMENT & PLAN:  #1 chronic anemia #2 chronic kidney disease This is likely anemia of chronic disease. The patient denies recent history of bleeding such as epistaxis, hematuria or hematochezia. She is asymptomatic from the anemia. We will observe for now.  There is no indication to start her on erythropoietin stimulating agents unless her hemoglobin dropped to less than 10 g. I recommend she continue followup with her primary care provider for blood count monitoring. If her hemoglobin dropped to less than 10 g, I will be happy to see the patient back in the clinic for further discussion about the use of erythropoietin stimulating agents. I will discharge patient from the clinic. All questions were answered. The patient knows to call the clinic with any problems, questions or concerns. No barriers to learning was detected.  I spent 15 minutes counseling the patient face to face. The total time spent in the appointment was 20 minutes and more than 50% was on counseling.     [facility] TxGORSUCH, Rebel Willcutt, MD 06/23/2013 9:24 AM

## 2013-06-27 ENCOUNTER — Ambulatory Visit (INDEPENDENT_AMBULATORY_CARE_PROVIDER_SITE_OTHER): Payer: Medicare Other | Admitting: Internal Medicine

## 2013-06-27 ENCOUNTER — Encounter: Payer: Self-pay | Admitting: Internal Medicine

## 2013-06-27 VITALS — BP 132/76 | HR 64 | Temp 97.9°F | Resp 18 | Ht 62.0 in | Wt 113.6 lb

## 2013-06-27 DIAGNOSIS — Z1331 Encounter for screening for depression: Secondary | ICD-10-CM

## 2013-06-27 DIAGNOSIS — Z1212 Encounter for screening for malignant neoplasm of rectum: Secondary | ICD-10-CM

## 2013-06-27 DIAGNOSIS — Z789 Other specified health status: Secondary | ICD-10-CM

## 2013-06-27 DIAGNOSIS — E785 Hyperlipidemia, unspecified: Secondary | ICD-10-CM

## 2013-06-27 DIAGNOSIS — E559 Vitamin D deficiency, unspecified: Secondary | ICD-10-CM

## 2013-06-27 DIAGNOSIS — Z79899 Other long term (current) drug therapy: Secondary | ICD-10-CM | POA: Insufficient documentation

## 2013-06-27 DIAGNOSIS — I10 Essential (primary) hypertension: Secondary | ICD-10-CM

## 2013-06-27 DIAGNOSIS — R7309 Other abnormal glucose: Secondary | ICD-10-CM

## 2013-06-27 DIAGNOSIS — Z0000 Encounter for general adult medical examination without abnormal findings: Secondary | ICD-10-CM

## 2013-06-27 LAB — CBC WITH DIFFERENTIAL/PLATELET
Basophils Absolute: 0 10*3/uL (ref 0.0–0.1)
Basophils Relative: 1 % (ref 0–1)
Eosinophils Absolute: 0.1 10*3/uL (ref 0.0–0.7)
Eosinophils Relative: 2 % (ref 0–5)
HEMATOCRIT: 34.2 % — AB (ref 36.0–46.0)
HEMOGLOBIN: 11.1 g/dL — AB (ref 12.0–15.0)
LYMPHS ABS: 1 10*3/uL (ref 0.7–4.0)
LYMPHS PCT: 26 % (ref 12–46)
MCH: 27.2 pg (ref 26.0–34.0)
MCHC: 32.5 g/dL (ref 30.0–36.0)
MCV: 83.8 fL (ref 78.0–100.0)
MONO ABS: 0.3 10*3/uL (ref 0.1–1.0)
MONOS PCT: 8 % (ref 3–12)
NEUTROS ABS: 2.5 10*3/uL (ref 1.7–7.7)
Neutrophils Relative %: 63 % (ref 43–77)
Platelets: 207 10*3/uL (ref 150–400)
RBC: 4.08 MIL/uL (ref 3.87–5.11)
RDW: 15.2 % (ref 11.5–15.5)
WBC: 3.9 10*3/uL — AB (ref 4.0–10.5)

## 2013-06-27 NOTE — Progress Notes (Signed)
Patient ID: Ann [name], female   DOB: 04-Sep-1930, 80 y.o.   MRN: [id number]   Annual Screening Comprehensive Examination  This very nice 80 y.o. [facility] presents for complete physical.  Patient has been followed for HTN, Diabetes  Prediabetes, Hyperlipidemia, and Vitamin D Deficiency.    HTN predates since 1997. Patient's BP has been controlled at home. Today's BP: 132/76 mmHg. Patient has Stage III CKD (GFR 49 ml/min) with associated Anemia of Chronic Disease. Patient denies any cardiac symptoms as chest pain, palpitations, shortness of breath, dizziness or ankle swelling.   Patient's hyperlipidemia is controlled with diet and medications. Patient denies myalgias or other medication SE's. Last cholesterol last visit was 160, triglycerides 106, HDL 48 and LDL 91 in Jan 2014 - all at goal.     Patient has prediabetes with A1c 6.1% in Oct 2010 and with last A1c 6.1% in Jan 2015. Patient denies reactive hypoglycemic symptoms, visual blurring, diabetic polys, or paresthesias.    Finally, patient has history of Vitamin D Deficiency of 25 in 2008 with last vitamin D 69 in Oct 2014.  Medication Sig  . ALPRAZolam (XANAX) 1 MG tablet Take 1 tablet TID PRN  . aspirin 81 MG tablet Take 81 mg by mouth daily.  . Biotin 2500 MCG CAPS Take by mouth daily.  . Cholecalciferol (VITAMIN D3) 2000 UNITS TABS Take daily  . CINNAMON PO Take 2,000 mg  daily.  . enalapril (VASOTEC) 20 MG tablet Take one tablet  every day  for blood pressure  . fish oil-omega-3 fatty acids 1000 MG capsule Take 1 gdaily. 1400 mg  . simvastatin (ZOCOR) 40 MG tablet Take one tablet  nightly at bedtime   No Known Allergies  Past Medical History  Diagnosis Date  . HTN (hypertension)   . Hyperlipidemia   . Vitamin D deficiency   . Degenerative joint disease   . Shingles   . Chronic renal insufficiency   . Anemia of chronic renal failure     History reviewed. No pertinent past surgical history.  Family History  Problem  Relation Age of Onset  . CVA Father   . Cancer Daughter     breast    History  Substance Use Topics  . Smoking status: Never Smoker   . Smokeless tobacco: Never Used  . Alcohol Use: 0.6 oz/week    1 Glasses of wine per week     Comment: occasional wine    ROS Constitutional: Denies fever, chills, weight loss/gain, headaches, insomnia, fatigue, night sweats, and change in appetite. Eyes: Denies redness, blurred vision, diplopia, discharge, itchy, watery eyes.  ENT: Denies discharge, congestion, post nasal drip, epistaxis, sore throat, earache, hearing loss, dental pain, Tinnitus, Vertigo, Sinus pain, snoring.  Cardio: Denies chest pain, palpitations, irregular heartbeat, syncope, dyspnea, diaphoresis, orthopnea, PND, claudication, edema Respiratory: denies cough, dyspnea, DOE, pleurisy, hoarseness, laryngitis, wheezing.  Gastrointestinal: Denies dysphagia, heartburn, reflux, water brash, pain, cramps, nausea, vomiting, bloating, diarrhea, constipation, hematemesis, melena, hematochezia, jaundice, hemorrhoids Genitourinary: Denies dysuria, frequency, urgency, nocturia, hesitancy, discharge, hematuria, flank pain Breast:Breast lumps, nipple discharge, bleeding.  Musculoskeletal: Denies arthralgia, myalgia, stiffness, Jt. Swelling, pain, limp, and strain/sprain. Skin: Denies puritis, rash, hives, warts, acne, eczema, changing in skin lesion Neuro: No weakness, tremor, incoordination, spasms, paresthesia, pain Psychiatric: Denies confusion, memory loss, sensory loss Endocrine: Denies change in weight, skin, hair change, nocturia, and paresthesia, diabetic polys, visual blurring, hyper / hypo glycemic episodes.  Heme/Lymph: No excessive bleeding, bruising, enlarged lymph nodes.   Physical Exam  BP 132/76  Pulse 64  Temp(Src) 97.9 F (36.6 C) (Temporal)  Resp 18  Ht 5\' 2"  (1.575 m)  Wt 113 lb 9.6 oz (51.529 kg)  BMI 20.77 kg/m2  General Appearance: Well nourished, in no apparent  distress. Eyes: PERRLA, EOMs, conjunctiva no swelling or erythema, normal fundi and vessels. Sinuses: No frontal/maxillary tenderness ENT/Mouth: EACs patent / TMs  nl. Nares clear without erythema, swelling, mucoid exudates. Oral hygiene is good. No erythema, swelling, or exudate. Tongue normal, non-obstructing. Tonsils not swollen or erythematous. Hearing normal.  Neck: Supple, thyroid normal. No bruits, nodes or JVD. Respiratory: Respiratory effort normal.  BS equal and clear bilateral without rales, rhonci, wheezing or stridor. Cardio: Heart sounds are normal with regular rate and rhythm and no murmurs, rubs or gallops. Peripheral pulses are normal and equal bilaterally without edema. No aortic or femoral bruits. Chest: symmetric with normal excursions and percussion. Breasts: Symmetric, without lumps, nipple discharge, retractions, or fibrocystic changes.  Abdomen: Flat, soft, with bowl sounds. Nontender, no guarding, rebound, hernias, masses, or organomegaly.  Lymphatics: Non tender without lymphadenopathy.  Genitourinary:  Musculoskeletal: Full ROM all peripheral extremities, joint stability, 5/5 strength, and normal gait. Skin: Warm and dry without rashes, lesions, cyanosis, clubbing or  ecchymosis.  Neuro: Cranial nerves intact, reflexes equal bilaterally. Normal muscle tone, no cerebellar symptoms. Sensation intact.  Pysch: Awake and oriented X 3, normal affect, Insight and Judgment appropriate.   Assessment and Plan  1. Annual Screening Examination 2. Hypertension  3. Hyperlipidemia 4. Pre Diabetes 5. Vitamin D Deficiency  Continue prudent diet as discussed, weight control, BP monitoring, regular exercise, and medications. Discussed med's effects and SE's. Screening labs and tests as requested with regular follow-up as recommended.

## 2013-06-27 NOTE — Patient Instructions (Signed)

## 2013-06-28 LAB — HEMOGLOBIN A1C
Hgb A1c MFr Bld: 5.9 % — ABNORMAL HIGH (ref <5.7)
MEAN PLASMA GLUCOSE: 123 mg/dL — AB (ref <117)

## 2013-06-28 LAB — BASIC METABOLIC PANEL WITH GFR
BUN: 26 mg/dL — ABNORMAL HIGH (ref 6–23)
CHLORIDE: 100 meq/L (ref 96–112)
CO2: 26 mEq/L (ref 19–32)
Calcium: 9.8 mg/dL (ref 8.4–10.5)
Creat: 1.13 mg/dL — ABNORMAL HIGH (ref 0.50–1.10)
GFR, EST NON AFRICAN AMERICAN: 46 mL/min — AB
GFR, Est African American: 53 mL/min — ABNORMAL LOW
GLUCOSE: 92 mg/dL (ref 70–99)
POTASSIUM: 5.1 meq/L (ref 3.5–5.3)
Sodium: 140 mEq/L (ref 135–145)

## 2013-06-28 LAB — LIPID PANEL
Cholesterol: 170 mg/dL (ref 0–200)
HDL: 63 mg/dL (ref >39)
LDL CALC: 90 mg/dL (ref 0–99)
TRIGLYCERIDES: 83 mg/dL (ref <150)
Total CHOL/HDL Ratio: 2.7 Ratio
VLDL: 17 mg/dL (ref 0–40)

## 2013-06-28 LAB — MAGNESIUM: Magnesium: 1.9 mg/dL (ref 1.5–2.5)

## 2013-06-28 LAB — MICROALBUMIN / CREATININE URINE RATIO
CREATININE, URINE: 101.3 mg/dL
MICROALB UR: 0.5 mg/dL (ref 0.00–1.89)
MICROALB/CREAT RATIO: 4.9 mg/g (ref 0.0–30.0)

## 2013-06-28 LAB — HEPATIC FUNCTION PANEL
ALK PHOS: 62 U/L (ref 39–117)
ALT: 9 U/L (ref 0–35)
AST: 17 U/L (ref 0–37)
Albumin: 4.2 g/dL (ref 3.5–5.2)
Bilirubin, Direct: 0.1 mg/dL (ref 0.0–0.3)
Indirect Bilirubin: 0.3 mg/dL (ref 0.2–1.2)
TOTAL PROTEIN: 6.9 g/dL (ref 6.0–8.3)
Total Bilirubin: 0.4 mg/dL (ref 0.2–1.2)

## 2013-06-28 LAB — TSH: TSH: 1.761 u[IU]/mL (ref 0.350–4.500)

## 2013-06-28 LAB — VITAMIN D 25 HYDROXY (VIT D DEFICIENCY, FRACTURES): Vit D, 25-Hydroxy: 59 ng/mL (ref 30–89)

## 2013-06-28 LAB — INSULIN, FASTING: Insulin fasting, serum: 12 u[IU]/mL (ref 3–28)

## 2013-07-12 ENCOUNTER — Other Ambulatory Visit (INDEPENDENT_AMBULATORY_CARE_PROVIDER_SITE_OTHER): Payer: Medicare Other

## 2013-07-12 DIAGNOSIS — Z1212 Encounter for screening for malignant neoplasm of rectum: Secondary | ICD-10-CM

## 2013-07-12 LAB — POC HEMOCCULT BLD/STL (HOME/3-CARD/SCREEN)
FECAL OCCULT BLD: NEGATIVE
FECAL OCCULT BLD: NEGATIVE
FECAL OCCULT BLD: NEGATIVE

## 2013-09-29 ENCOUNTER — Encounter: Payer: Self-pay | Admitting: Physician Assistant

## 2013-09-29 ENCOUNTER — Ambulatory Visit (INDEPENDENT_AMBULATORY_CARE_PROVIDER_SITE_OTHER): Payer: Medicare Other | Admitting: Physician Assistant

## 2013-09-29 VITALS — BP 102/60 | HR 60 | Temp 98.1°F | Resp 16 | Wt 112.0 lb

## 2013-09-29 DIAGNOSIS — Z1331 Encounter for screening for depression: Secondary | ICD-10-CM

## 2013-09-29 DIAGNOSIS — E785 Hyperlipidemia, unspecified: Secondary | ICD-10-CM

## 2013-09-29 DIAGNOSIS — Z79899 Other long term (current) drug therapy: Secondary | ICD-10-CM

## 2013-09-29 DIAGNOSIS — E559 Vitamin D deficiency, unspecified: Secondary | ICD-10-CM

## 2013-09-29 DIAGNOSIS — N183 Chronic kidney disease, stage 3 unspecified: Secondary | ICD-10-CM

## 2013-09-29 DIAGNOSIS — R7303 Prediabetes: Secondary | ICD-10-CM

## 2013-09-29 DIAGNOSIS — Z789 Other specified health status: Secondary | ICD-10-CM

## 2013-09-29 DIAGNOSIS — I10 Essential (primary) hypertension: Secondary | ICD-10-CM

## 2013-09-29 DIAGNOSIS — Z0000 Encounter for general adult medical examination without abnormal findings: Secondary | ICD-10-CM

## 2013-09-29 NOTE — Patient Instructions (Signed)

## 2013-09-29 NOTE — Progress Notes (Signed)
MEDICARE ANNUAL WELLNESS VISIT AND FOLLOW UP  Assessment:   1. Essential hypertension - CBC with Differential - BASIC METABOLIC PANEL WITH GFR - Hepatic function panel - TSH  2. Hyperlipidemia - Lipid panel  3. Encounter for long-term (current) use of other medications - Magnesium  4. Vitamin D Deficiency  5. CKD Stage III  (GFR 49 ml/min) Check BMP, continue ACE  6. Prediabetes Discussed general issues about diabetes pathophysiology and management., Educational material distributed., Suggested low cholesterol diet., Encouraged aerobic exercise., Discussed foot care., Reminded to get yearly retinal exam. - Hemoglobin A1c - HM DIABETES FOOT EXAM   Plan:   During the course of the visit the patient was educated and counseled about appropriate screening and preventive services including:    Pneumococcal vaccine   Influenza vaccine  Td vaccine  Screening electrocardiogram  Screening mammography  Bone densitometry screening  Colorectal cancer screening  Diabetes screening  Glaucoma screening  Nutrition counseling   Advanced directives: given info/requested  Screening recommendations, referrals:  Vaccinations: Tdap vaccine not indicated Influenza vaccine not indicated Pneumococcal vaccine not indicated Shingles vaccine declined Hep B vaccine not indicated  Nutrition assessed and recommended  Colonoscopy declined Mammogram requested Pap smear not indicated Pelvic exam not indicated Recommended yearly ophthalmology/optometry visit for glaucoma screening and checkup Recommended yearly dental visit for hygiene and checkup Advanced directives - declined  Conditions/risks identified: BMI: Discussed weight loss, diet, and increase physical activity.  Increase physical activity: AHA recommends 150 minutes of physical activity a week.  Medications reviewed DEXA- undecided, long discussion about risk of osteoporosis and decrease in quality of life and  increase in mortality after breaking a hip, she is very low risk fall but encouraged getting 1 screen DEXA Diabetes is at goal, ACE/ARB therapy: Yes. Urinary Incontinence is not an issue: discussed non pharmacology and pharmacology options.  Fall risk: low- discussed PT, home fall assessment, medications.    Subjective:   Ann [name] is a 80 y.o. female who presents for Medicare Annual Wellness Visit and 3 month follow up on hypertension, prediabetes, hyperlipidemia, vitamin D def.  Date of last medicare wellness visit is unknown.   Her blood pressure has been controlled at home, today their BP is BP: 102/60 mmHg She does workout, mows her law, walks a mile in the morning, and does stationary bike. She denies chest pain, shortness of breath, dizziness.  She is on cholesterol medication and denies myalgias. Her cholesterol is at goal. The cholesterol last visit was:   Lab Results  Component Value Date   CHOL 170 06/27/2013   HDL 63 06/27/2013   LDLCALC 90 06/27/2013   TRIG 83 06/27/2013   CHOLHDL 2.7 06/27/2013   She has been working on diet and exercise for prediabetes, and denies paresthesia of the feet, polydipsia and polyuria. Last A1C in the office was:  Lab Results  Component Value Date   HGBA1C 5.9* 06/27/2013   Patient is on Vitamin D supplement. Lab Results  Component Value Date   VD25OH 2659 06/27/2013      Names of Other Physician/Practitioners you currently use: 1. [facility]- here for primary care 2. Dr. Elmer PickerHecker eye doctor, last visit 8 months ago Patient Care Team: Lucky CowboyWilliam McKeown, MD as PCP - General (Internal Medicine) Dr. Juanda ChanceBrodie, GI  Medication Review Current Outpatient Prescriptions on File Prior to Visit  Medication Sig Dispense Refill  . ALPRAZolam (XANAX) 1 MG tablet Take 1 tablet (1 mg total) by mouth 3 (three)  times daily as needed for anxiety (for sleep and occasional anxiety).  90 tablet  0  . aspirin 81 MG tablet  Take 81 mg by mouth daily.      . Biotin 2500 MCG CAPS Take by mouth daily.      . Cholecalciferol (VITAMIN D3) 2000 UNITS TABS Take by mouth.      Marland Kitchen. CINNAMON PO Take 2,000 mg by mouth daily.      . enalapril (VASOTEC) 20 MG tablet Take one tablet by mouth every day  for blood pressure  90 tablet  0  . fish oil-omega-3 fatty acids 1000 MG capsule Take 1 g by mouth daily. 1400 mg      . simvastatin (ZOCOR) 40 MG tablet Take one tablet by mouth   nightly at bedtime  90 tablet  2   No current facility-administered medications on file prior to visit.    Current Problems (verified) Patient Active Problem List   Diagnosis Date Noted  . Encounter for long-term (current) use of other medications 06/27/2013  . HTN (hypertension)   . Hyperlipidemia   . Vitamin D Deficiency   . Degenerative joint disease   . CKD Stage III  (GFR 49 ml/min)   . Anemia of chronic renal failure     Screening Tests Health Maintenance  Topic Date Due  . Colonoscopy  12/25/1982  . Zostavax  12/24/1992  . Influenza Vaccine  10/29/2013  . Tetanus/tdap  12/06/2015  . Pneumococcal Polysaccharide Vaccine Age 78 And Over  Completed     Immunization History  Administered Date(s) Administered  . Influenza Split 01/17/2013  . Pneumococcal Polysaccharide-23 06/04/2010  . Pneumococcal-Unspecified 01/31/1999  . Td 04/01/1995, 12/05/2005    Preventative care: Last colonoscopy: 2006 declines another Last mammogram: 03/2012 Last pap smear/pelvic exam: remote DEXA:declines  Prior vaccinations: TD or Tdap: 2007  Influenza: 2014  Pneumococcal: 2012 Shingles/Zostavax: declines  History reviewed: allergies, current medications, past family history, past medical history, past social history, past surgical history and problem list  Risk Factors: Osteoporosis: postmenopausal estrogen deficiency and dietary calcium and/or vitamin D deficiency History of fracture in the past year: no  Tobacco History  Substance  Use Topics  . Smoking status: Never Smoker   . Smokeless tobacco: Never Used  . Alcohol Use: 0.6 oz/week    1 Glasses of wine per week     Comment: occasional wine   She does not smoke.  Patient is not a former smoker. Are there smokers in your home (other than you)?  No  Alcohol Current alcohol use: glass of wine with dinner  Caffeine Current caffeine use: coffee 1 /day  Exercise Current exercise: bicycling, gardening, housecleaning, walking and yard work  Nutrition/Diet Current diet: in general, a "healthy" diet    Cardiac risk factors: advanced age (older than 8255 for men, 4965 for women), dyslipidemia and hypertension.  Depression Screen (Note: if answer to either of the following is "Yes", a more complete depression screening is indicated)   Q1: Over the past two weeks, have you felt down, depressed or hopeless? No  Q2: Over the past two weeks, have you felt little interest or pleasure in doing things? No  Have you lost interest or pleasure in daily life? No  Do you often feel hopeless? No  Do you cry easily over simple problems? No  Activities of Daily Living In your present state of health, do you have any difficulty performing the following activities?:  Driving? No Managing money?  No Feeding  yourself? No Getting from bed to chair? No Climbing a flight of stairs? No Preparing food and eating?: No Bathing or showering? No Getting dressed: No Getting to the toilet? No Using the toilet:No Moving around from place to place: No In the past year have you fallen or had a near fall?:No   Are you sexually active?  No  Do you have more than one partner?  No  Vision Difficulties: No  Hearing Difficulties: No Do you often ask people to speak up or repeat themselves? No Do you experience ringing or noises in your ears? Yes Do you have difficulty understanding soft or whispered voices? No  Cognition  Do you feel that you have a problem with memory?No  Do you often  misplace items? No  Do you feel safe at home?  Yes  Advanced directives Does patient have a Health Care Power of Attorney? No Does patient have a Living Will? No   Objective:   Blood pressure 102/60, pulse 60, temperature 98.1 F (36.7 C), resp. rate 16, weight 112 lb (50.803 kg). Body mass index is 20.48 kg/(m^2).  General appearance: alert, no distress, WD/WN,  female Cognitive Testing  Alert? Yes  Normal Appearance?Yes  Oriented to person? Yes  Place? Yes   Time? Yes  Recall of three objects?  Yes  Can perform simple calculations? Yes  Displays appropriate judgment?Yes  Can read the correct time from a watch face?Yes  HEENT: normocephalic, sclerae anicteric, TMs pearly, nares patent, no discharge or erythema, pharynx normal Oral cavity: MMM, no lesions Neck: supple, no lymphadenopathy, no thyromegaly, no masses Heart: RRR, normal S1, S2, no murmurs Lungs: CTA bilaterally, no wheezes, rhonchi, or rales Abdomen: +bs, soft, non tender, non distended, no masses, no hepatomegaly, no splenomegaly Musculoskeletal: nontender, no swelling, no obvious deformity Extremities: no edema, no cyanosis, no clubbing Pulses: 2+ symmetric, upper and lower extremities, normal cap refill Neurological: alert, oriented x 3, CN2-12 intact, strength normal upper extremities and lower extremities, sensation normal throughout, DTRs 2+ throughout, no cerebellar signs, gait normal Psychiatric: normal affect, behavior normal, pleasant  Breast: defer Gyn: defer Rectal: defer  Medicare Attestation I have personally reviewed: The patient's medical and social history Their use of alcohol, tobacco or illicit drugs Their current medications and supplements The patient's functional ability including ADLs,fall risks, home safety risks, cognitive, and hearing and visual impairment Diet and physical activities Evidence for depression or mood disorders  The patient's weight, height, BMI, and visual acuity  have been recorded in the chart.  I have made referrals, counseling, and provided education to the patient based on review of the above and I have provided the patient with a written personalized care plan for preventive services.     Quentin Mulling, PA-C   09/29/2013

## 2013-09-30 LAB — HEPATIC FUNCTION PANEL
ALT: 11 U/L (ref 0–35)
AST: 19 U/L (ref 0–37)
Albumin: 4.6 g/dL (ref 3.5–5.2)
Alkaline Phosphatase: 57 U/L (ref 39–117)
BILIRUBIN INDIRECT: 0.5 mg/dL (ref 0.2–1.2)
Bilirubin, Direct: 0.1 mg/dL (ref 0.0–0.3)
Total Bilirubin: 0.6 mg/dL (ref 0.2–1.2)
Total Protein: 6.9 g/dL (ref 6.0–8.3)

## 2013-09-30 LAB — LIPID PANEL
Cholesterol: 197 mg/dL (ref 0–200)
HDL: 70 mg/dL (ref >39)
LDL Cholesterol: 114 mg/dL — ABNORMAL HIGH (ref 0–99)
Total CHOL/HDL Ratio: 2.8 Ratio
Triglycerides: 67 mg/dL (ref <150)
VLDL: 13 mg/dL (ref 0–40)

## 2013-09-30 LAB — BASIC METABOLIC PANEL WITH GFR
BUN: 23 mg/dL (ref 6–23)
CALCIUM: 9.9 mg/dL (ref 8.4–10.5)
CO2: 28 mEq/L (ref 19–32)
Chloride: 99 mEq/L (ref 96–112)
Creat: 1.11 mg/dL — ABNORMAL HIGH (ref 0.50–1.10)
GFR, EST AFRICAN AMERICAN: 54 mL/min — AB
GFR, EST NON AFRICAN AMERICAN: 47 mL/min — AB
Glucose, Bld: 90 mg/dL (ref 70–99)
POTASSIUM: 5.6 meq/L — AB (ref 3.5–5.3)
Sodium: 136 mEq/L (ref 135–145)

## 2013-09-30 LAB — CBC WITH DIFFERENTIAL/PLATELET
BASOS ABS: 0 10*3/uL (ref 0.0–0.1)
Basophils Relative: 1 % (ref 0–1)
Eosinophils Absolute: 0 10*3/uL (ref 0.0–0.7)
Eosinophils Relative: 1 % (ref 0–5)
HCT: 36.2 % (ref 36.0–46.0)
HEMOGLOBIN: 11.7 g/dL — AB (ref 12.0–15.0)
Lymphocytes Relative: 33 % (ref 12–46)
Lymphs Abs: 1.2 10*3/uL (ref 0.7–4.0)
MCH: 26.6 pg (ref 26.0–34.0)
MCHC: 32.3 g/dL (ref 30.0–36.0)
MCV: 82.3 fL (ref 78.0–100.0)
Monocytes Absolute: 0.2 10*3/uL (ref 0.1–1.0)
Monocytes Relative: 6 % (ref 3–12)
NEUTROS ABS: 2.2 10*3/uL (ref 1.7–7.7)
NEUTROS PCT: 59 % (ref 43–77)
PLATELETS: 179 10*3/uL (ref 150–400)
RBC: 4.4 MIL/uL (ref 3.87–5.11)
RDW: 15.4 % (ref 11.5–15.5)
WBC: 3.7 10*3/uL — ABNORMAL LOW (ref 4.0–10.5)

## 2013-09-30 LAB — HEMOGLOBIN A1C
Hgb A1c MFr Bld: 5.9 % — ABNORMAL HIGH (ref <5.7)
Mean Plasma Glucose: 123 mg/dL — ABNORMAL HIGH (ref <117)

## 2013-09-30 LAB — MAGNESIUM: MAGNESIUM: 2 mg/dL (ref 1.5–2.5)

## 2013-09-30 LAB — TSH: TSH: 1.598 u[IU]/mL (ref 0.350–4.500)

## 2013-12-29 ENCOUNTER — Ambulatory Visit: Payer: Self-pay | Admitting: Internal Medicine

## 2014-01-01 DIAGNOSIS — R7303 Prediabetes: Secondary | ICD-10-CM | POA: Insufficient documentation

## 2014-01-01 DIAGNOSIS — R7309 Other abnormal glucose: Secondary | ICD-10-CM | POA: Insufficient documentation

## 2014-01-01 NOTE — Progress Notes (Signed)
Patient ID: Ann KusterInez A Sami, female   DOB: 1930-09-09, 80 y.o.   MRN: [id number]   This very nice 80 y.o.female presents for 3 month follow up with Hypertension, Hyperlipidemia, Pre-Diabetes and Vitamin D Deficiency.    Patient is treated for HTN & BP has been controlled at home. Today's BP: 124/68 mmHg. Patient has had no complaints of any cardiac type chest pain, palpitations, dyspnea/orthopnea/PND, dizziness, claudication, or dependent edema.   Hyperlipidemia is controlled with diet & meds. Patient denies myalgias or other med SE's. Last Lipids were Total Chol 197; HDL  70; LDL  114*; Trig 67 on 09/29/2013.   Also, the patient has history of  PreDiabetes and has had no symptoms of reactive hypoglycemia, diabetic polys, paresthesias or visual blurring.  Last A1c was 09/29/2013: Hemoglobin-A1c 5.9% on     Further, the patient also has history of Vitamin D Deficiency and supplements vitamin D without any suspected side-effects. Last vitamin D was  59 on 06/27/2013.   Medication List   ALPRAZolam 1 MG tablet  Commonly known as:  XANAX  Take 1 tablet (1 mg total) by mouth 3 (three) times daily as needed for anxiety (for sleep and occasional anxiety).     aspirin 81 MG tablet  Take 81 mg by mouth daily.     Biotin 2500 MCG Caps  Take by mouth daily.     CINNAMON PO  Take 2,000 mg by mouth daily.     fish oil-omega-3 fatty acids 1000 MG capsule  Take 1 g by mouth daily. 1400 mg     simvastatin 40 MG tablet  Commonly known as:  ZOCOR  Take one tablet by mouth   nightly at bedtime     Vitamin D3 2000 UNITS Tabs  Take by mouth.     Allergies  Allergen Reactions  . Ace Inhibitors     Hyperkalemia     PMHx:   Past Medical History  Diagnosis Date  . HTN (hypertension)   . Hyperlipidemia   . Vitamin D deficiency   . Degenerative joint disease   . Shingles   . Chronic renal insufficiency   . Anemia of chronic renal failure    FHx:    Reviewed / unchanged  SHx:    Reviewed /  unchanged  Systems Review:  Constitutional: Denies fever, chills, wt changes, headaches, insomnia, fatigue, night sweats, change in appetite. Eyes: Denies redness, blurred vision, diplopia, discharge, itchy, watery eyes.  ENT: Denies discharge, congestion, post nasal drip, epistaxis, sore throat, earache, hearing loss, dental pain, tinnitus, vertigo, sinus pain, snoring.  CV: Denies chest pain, palpitations, irregular heartbeat, syncope, dyspnea, diaphoresis, orthopnea, PND, claudication or edema. Respiratory: denies cough, dyspnea, DOE, pleurisy, hoarseness, laryngitis, wheezing.  Gastrointestinal: Denies dysphagia, odynophagia, heartburn, reflux, water brash, abdominal pain or cramps, nausea, vomiting, bloating, diarrhea, constipation, hematemesis, melena, hematochezia  or hemorrhoids. Genitourinary: Denies dysuria, frequency, urgency, nocturia, hesitancy, discharge, hematuria or flank pain. Musculoskeletal: Denies arthralgias, myalgias, stiffness, jt. swelling, pain, limping or strain/sprain.  Skin: Denies pruritus, rash, hives, warts, acne, eczema or change in skin lesion(s). Neuro: No weakness, tremor, incoordination, spasms, paresthesia or pain. Psychiatric: Denies confusion, memory loss or sensory loss. Endo: Denies change in weight, skin or hair change.  Heme/Lymph: No excessive bleeding, bruising or enlarged lymph nodes.  Exam:  BP 124/68  Pulse 64  Temp(Src) 97.9 F (36.6 C) (Temporal)  Resp 16  Ht 5\' 2"  (1.575 m)  Wt 113 lb 6.4 oz (51.438 kg)  BMI 20.74 kg/m2  Appears well nourished and in no distress. Eyes: PERRLA, EOMs, conjunctiva no swelling or erythema. Sinuses: No frontal/maxillary tenderness ENT/Mouth: EAC's clear, TM's nl w/o erythema, bulging. Nares clear w/o erythema, swelling, exudates. Oropharynx clear without erythema or exudates. Oral hygiene is good. Tongue normal, non obstructing. Hearing intact.  Neck: Supple. Thyroid nl. Car 2+/2+ without bruits, nodes or  JVD. Chest: Respirations nl with BS clear & equal w/o rales, rhonchi, wheezing or stridor.  Cor: Heart sounds normal w/ regular rate and rhythm without sig. murmurs, gallops, clicks, or rubs. Peripheral pulses normal and equal  without edema.  Abdomen: Soft & bowel sounds normal. Non-tender w/o guarding, rebound, hernias, masses, or organomegaly.  Lymphatics: Unremarkable.  Musculoskeletal: Full ROM all peripheral extremities, joint stability, 5/5 strength, and normal gait.  Skin: Warm, dry without exposed rashes, lesions or ecchymosis apparent.  Neuro: Cranial nerves intact, reflexes equal bilaterally. Sensory-motor testing grossly intact. Tendon reflexes grossly intact.  Pysch: Alert & oriented x 3.  Insight and judgement nl & appropriate. No ideations.  Assessment and Plan:  1. Hypertension - Continue monitor blood pressure at home. Continue diet/meds same.  2. Hyperlipidemia - Continue diet/meds, exercise,& lifestyle modifications. Continue monitor periodic cholesterol/liver & renal functions   3. Pre-Diabetes - Continue diet, exercise, lifestyle modifications. Monitor appropriate labs.  4. Vitamin D Deficiency - Continue supplementation.   Recommended regular exercise, BP monitoring, weight control, and discussed med and SE's. Recommended labs to assess and monitor clinical status. Further disposition pending results of labs.

## 2014-01-01 NOTE — Patient Instructions (Addendum)

## 2014-01-02 ENCOUNTER — Ambulatory Visit (INDEPENDENT_AMBULATORY_CARE_PROVIDER_SITE_OTHER): Payer: Medicare Other | Admitting: Internal Medicine

## 2014-01-02 ENCOUNTER — Encounter: Payer: Self-pay | Admitting: Internal Medicine

## 2014-01-02 VITALS — BP 124/68 | HR 64 | Temp 97.9°F | Resp 16 | Ht 62.0 in | Wt 113.4 lb

## 2014-01-02 DIAGNOSIS — I10 Essential (primary) hypertension: Secondary | ICD-10-CM

## 2014-01-02 DIAGNOSIS — R7309 Other abnormal glucose: Secondary | ICD-10-CM

## 2014-01-02 DIAGNOSIS — Z0001 Encounter for general adult medical examination with abnormal findings: Secondary | ICD-10-CM

## 2014-01-02 DIAGNOSIS — Z79899 Other long term (current) drug therapy: Secondary | ICD-10-CM

## 2014-01-02 DIAGNOSIS — E559 Vitamin D deficiency, unspecified: Secondary | ICD-10-CM

## 2014-01-02 DIAGNOSIS — Z23 Encounter for immunization: Secondary | ICD-10-CM

## 2014-01-02 DIAGNOSIS — R7303 Prediabetes: Secondary | ICD-10-CM

## 2014-01-02 DIAGNOSIS — E785 Hyperlipidemia, unspecified: Secondary | ICD-10-CM

## 2014-01-02 LAB — LIPID PANEL
Cholesterol: 197 mg/dL (ref 0–200)
HDL: 66 mg/dL (ref >39)
LDL Cholesterol: 115 mg/dL — ABNORMAL HIGH (ref 0–99)
Total CHOL/HDL Ratio: 3 Ratio
Triglycerides: 82 mg/dL (ref <150)
VLDL: 16 mg/dL (ref 0–40)

## 2014-01-02 LAB — HEPATIC FUNCTION PANEL
ALT: 10 U/L (ref 0–35)
AST: 19 U/L (ref 0–37)
Albumin: 4.9 g/dL (ref 3.5–5.2)
Alkaline Phosphatase: 67 U/L (ref 39–117)
Bilirubin, Direct: 0.1 mg/dL (ref 0.0–0.3)
Indirect Bilirubin: 0.5 mg/dL (ref 0.2–1.2)
Total Bilirubin: 0.6 mg/dL (ref 0.2–1.2)
Total Protein: 7.2 g/dL (ref 6.0–8.3)

## 2014-01-02 LAB — BASIC METABOLIC PANEL WITH GFR
BUN: 22 mg/dL (ref 6–23)
CALCIUM: 10.3 mg/dL (ref 8.4–10.5)
CO2: 27 mEq/L (ref 19–32)
CREATININE: 1.16 mg/dL — AB (ref 0.50–1.10)
Chloride: 100 mEq/L (ref 96–112)
GFR, EST AFRICAN AMERICAN: 51 mL/min — AB
GFR, EST NON AFRICAN AMERICAN: 44 mL/min — AB
Glucose, Bld: 101 mg/dL — ABNORMAL HIGH (ref 70–99)
Potassium: 4.7 mEq/L (ref 3.5–5.3)
Sodium: 138 mEq/L (ref 135–145)

## 2014-01-02 LAB — CBC WITH DIFFERENTIAL/PLATELET
Basophils Absolute: 0 10*3/uL (ref 0.0–0.1)
Basophils Relative: 1 % (ref 0–1)
Eosinophils Absolute: 0 10*3/uL (ref 0.0–0.7)
Eosinophils Relative: 1 % (ref 0–5)
HEMATOCRIT: 37.5 % (ref 36.0–46.0)
HEMOGLOBIN: 12.2 g/dL (ref 12.0–15.0)
LYMPHS ABS: 1.3 10*3/uL (ref 0.7–4.0)
Lymphocytes Relative: 30 % (ref 12–46)
MCH: 26.4 pg (ref 26.0–34.0)
MCHC: 32.5 g/dL (ref 30.0–36.0)
MCV: 81.2 fL (ref 78.0–100.0)
MONOS PCT: 7 % (ref 3–12)
Monocytes Absolute: 0.3 10*3/uL (ref 0.1–1.0)
NEUTROS ABS: 2.6 10*3/uL (ref 1.7–7.7)
Neutrophils Relative %: 61 % (ref 43–77)
Platelets: 198 10*3/uL (ref 150–400)
RBC: 4.62 MIL/uL (ref 3.87–5.11)
RDW: 14.6 % (ref 11.5–15.5)
WBC: 4.2 10*3/uL (ref 4.0–10.5)

## 2014-01-02 LAB — TSH: TSH: 2.046 u[IU]/mL (ref 0.350–4.500)

## 2014-01-02 LAB — HEMOGLOBIN A1C
Hgb A1c MFr Bld: 5.9 % — ABNORMAL HIGH (ref <5.7)
Mean Plasma Glucose: 123 mg/dL — ABNORMAL HIGH (ref <117)

## 2014-01-02 LAB — MAGNESIUM: MAGNESIUM: 1.9 mg/dL (ref 1.5–2.5)

## 2014-01-02 NOTE — Addendum Note (Signed)
Addended by: Odalys Win A on: 01/02/2014 12:43 PM   Modules accepted: Orders

## 2014-01-03 LAB — INSULIN, FASTING: Insulin fasting, serum: 5.2 u[IU]/mL (ref 2.0–19.6)

## 2014-01-03 LAB — VITAMIN D 25 HYDROXY (VIT D DEFICIENCY, FRACTURES): Vit D, 25-Hydroxy: 50 ng/mL (ref 30–89)

## 2014-01-31 ENCOUNTER — Other Ambulatory Visit: Payer: Self-pay | Admitting: Emergency Medicine

## 2014-04-28 ENCOUNTER — Encounter: Payer: Self-pay | Admitting: Physician Assistant

## 2014-04-28 ENCOUNTER — Ambulatory Visit (INDEPENDENT_AMBULATORY_CARE_PROVIDER_SITE_OTHER): Payer: Medicare Other | Admitting: Physician Assistant

## 2014-04-28 VITALS — BP 138/80 | HR 64 | Temp 97.7°F | Resp 16 | Ht 62.0 in | Wt 113.0 lb

## 2014-04-28 DIAGNOSIS — N183 Chronic kidney disease, stage 3 unspecified: Secondary | ICD-10-CM

## 2014-04-28 DIAGNOSIS — R7303 Prediabetes: Secondary | ICD-10-CM

## 2014-04-28 DIAGNOSIS — E559 Vitamin D deficiency, unspecified: Secondary | ICD-10-CM

## 2014-04-28 DIAGNOSIS — Z79899 Other long term (current) drug therapy: Secondary | ICD-10-CM

## 2014-04-28 DIAGNOSIS — R7309 Other abnormal glucose: Secondary | ICD-10-CM

## 2014-04-28 DIAGNOSIS — E785 Hyperlipidemia, unspecified: Secondary | ICD-10-CM

## 2014-04-28 DIAGNOSIS — N189 Chronic kidney disease, unspecified: Secondary | ICD-10-CM

## 2014-04-28 DIAGNOSIS — D631 Anemia in chronic kidney disease: Secondary | ICD-10-CM

## 2014-04-28 DIAGNOSIS — I10 Essential (primary) hypertension: Secondary | ICD-10-CM

## 2014-04-28 LAB — HEMOGLOBIN A1C
Hgb A1c MFr Bld: 5.8 % — ABNORMAL HIGH (ref <5.7)
Mean Plasma Glucose: 120 mg/dL — ABNORMAL HIGH (ref <117)

## 2014-04-28 LAB — CBC WITH DIFFERENTIAL/PLATELET
BASOS ABS: 0 10*3/uL (ref 0.0–0.1)
Basophils Relative: 0 % (ref 0–1)
EOS ABS: 0 10*3/uL (ref 0.0–0.7)
EOS PCT: 1 % (ref 0–5)
HCT: 36.7 % (ref 36.0–46.0)
Hemoglobin: 11.8 g/dL — ABNORMAL LOW (ref 12.0–15.0)
LYMPHS ABS: 1.3 10*3/uL (ref 0.7–4.0)
Lymphocytes Relative: 28 % (ref 12–46)
MCH: 27.4 pg (ref 26.0–34.0)
MCHC: 32.2 g/dL (ref 30.0–36.0)
MCV: 85.3 fL (ref 78.0–100.0)
MPV: 11.4 fL (ref 8.6–12.4)
Monocytes Absolute: 0.3 10*3/uL (ref 0.1–1.0)
Monocytes Relative: 7 % (ref 3–12)
Neutro Abs: 2.9 10*3/uL (ref 1.7–7.7)
Neutrophils Relative %: 64 % (ref 43–77)
PLATELETS: 181 10*3/uL (ref 150–400)
RBC: 4.3 MIL/uL (ref 3.87–5.11)
RDW: 14.9 % (ref 11.5–15.5)
WBC: 4.5 10*3/uL (ref 4.0–10.5)

## 2014-04-28 LAB — HEPATIC FUNCTION PANEL
ALT: 11 U/L (ref 0–35)
AST: 19 U/L (ref 0–37)
Albumin: 4.4 g/dL (ref 3.5–5.2)
Alkaline Phosphatase: 71 U/L (ref 39–117)
BILIRUBIN TOTAL: 0.5 mg/dL (ref 0.2–1.2)
Bilirubin, Direct: 0.1 mg/dL (ref 0.0–0.3)
Indirect Bilirubin: 0.4 mg/dL (ref 0.2–1.2)
Total Protein: 6.5 g/dL (ref 6.0–8.3)

## 2014-04-28 LAB — LIPID PANEL
Cholesterol: 181 mg/dL (ref 0–200)
HDL: 68 mg/dL (ref >39)
LDL Cholesterol: 99 mg/dL (ref 0–99)
TRIGLYCERIDES: 72 mg/dL (ref <150)
Total CHOL/HDL Ratio: 2.7 Ratio
VLDL: 14 mg/dL (ref 0–40)

## 2014-04-28 LAB — BASIC METABOLIC PANEL WITH GFR
BUN: 22 mg/dL (ref 6–23)
CALCIUM: 9.4 mg/dL (ref 8.4–10.5)
CHLORIDE: 102 meq/L (ref 96–112)
CO2: 27 meq/L (ref 19–32)
Creat: 1 mg/dL (ref 0.50–1.10)
GFR, EST AFRICAN AMERICAN: 61 mL/min
GFR, EST NON AFRICAN AMERICAN: 53 mL/min — AB
Glucose, Bld: 88 mg/dL (ref 70–99)
Potassium: 4.4 mEq/L (ref 3.5–5.3)
Sodium: 139 mEq/L (ref 135–145)

## 2014-04-28 LAB — MAGNESIUM: MAGNESIUM: 1.8 mg/dL (ref 1.5–2.5)

## 2014-04-28 NOTE — Progress Notes (Signed)
Assessment and Plan:  Hypertension: Continue medication, monitor blood pressure at home. Continue DASH diet.  Reminder to go to the ER if any CP, SOB, nausea, dizziness, severe HA, changes vision/speech, left arm numbness and tingling, and jaw pain. Cholesterol: Continue diet and exercise. Check cholesterol.  Pre-diabetes-Continue diet and exercise. Check A1C CKD stage III due to HTN- Increase fluids, avoid NSAIDS, monitor sugars, will monitor Vitamin D Def- check level and continue medications.   Continue diet and meds as discussed. Further disposition pending results of labs.  HPI 81 y.o. female  presents for 3 month follow up with hypertension, hyperlipidemia, prediabetes and vitamin D .Her blood pressure has been controlled at home, she is on enalapril , today their BP is BP: 138/80 mmHgShe  She does workout. She denies chest pain, shortness of breath, dizziness.  She is on cholesterol medication, zocor  and denies myalgias. Her cholesterol is at goal. The cholesterol last visit was:   Lab Results  Component Value Date   CHOL 197 01/02/2014   HDL 66 01/02/2014   LDLCALC 115* 01/02/2014   TRIG 82 01/02/2014   CHOLHDL 3.0 01/02/2014   She has been working on diet and exercise for prediabetes, and denies paresthesia of the feet, polydipsia, polyuria and visual disturbances. Last A1C in the office was:  Lab Results  Component Value Date   HGBA1C 5.9* 01/02/2014  Patient is on Vitamin D supplement.   Lab Results  Component Value Date   VD25OH 50 01/02/2014      Current Medications:  Current Outpatient Prescriptions on File Prior to Visit  Medication Sig Dispense Refill  . ALPRAZolam (XANAX) 1 MG tablet Take 1 tablet (1 mg total) by mouth 3 (three) times daily as needed for anxiety (for sleep and occasional anxiety). 90 tablet 0  . aspirin 81 MG tablet Take 81 mg by mouth daily.    . Biotin 2500 MCG CAPS Take by mouth daily.    . Cholecalciferol (VITAMIN D3) 2000 UNITS TABS  Take by mouth.    Marland Kitchen CINNAMON PO Take 2,000 mg by mouth daily.    . fish oil-omega-3 fatty acids 1000 MG capsule Take 1 g by mouth daily. 1400 mg    . simvastatin (ZOCOR) 40 MG tablet TAKE ONE TABLET BY MOUTH NIGHTLY AT BEDTIME  90 tablet 1   No current facility-administered medications on file prior to visit.   Medical History:  Past Medical History  Diagnosis Date  . HTN (hypertension)   . Hyperlipidemia   . Vitamin D deficiency   . Degenerative joint disease   . Shingles   . Chronic renal insufficiency   . Anemia of chronic renal failure    Allergies:  Allergies  Allergen Reactions  . Ace Inhibitors     Hyperkalemia    Review of Systems:  Review of Systems  Constitutional: Negative.   HENT: Negative.   Eyes: Negative.   Respiratory: Negative.   Cardiovascular: Negative.   Gastrointestinal: Negative.   Genitourinary: Negative.   Musculoskeletal: Negative.   Skin: Negative.   Neurological: Negative.   Endo/Heme/Allergies: Negative.   Psychiatric/Behavioral: Negative.      Family history- Review and unchanged Social history- Review and unchanged Physical Exam: BP 138/80 mmHg  Pulse 64  Temp(Src) 97.7 F (36.5 C)  Resp 16  Ht  (1.575 m)  Wt 113 lb (51.256 kg)  BMI 20.66 kg/m2 Wt Readings from Last 3 Encounters:  04/28/14 113 lb (51.256 kg)  01/02/14 113 lb 6.4 oz (51.438  kg)  09/29/13 112 lb (50.803 kg)   General Appearance: Well nourished, in no apparent distress. Eyes: PERRLA, EOMs, conjunctiva no swelling or erythema Sinuses: No Frontal/maxillary tenderness ENT/Mouth: Ext aud canals clear, TMs without erythema, bulging. No erythema, swelling, or exudate on post pharynx.  Tonsils not swollen or erythematous. Hearing normal.  Neck: Supple, thyroid normal.  Respiratory: Respiratory effort normal, BS equal bilaterally without rales, rhonchi, wheezing or stridor.  Cardio: RRR with no MRGs. Brisk peripheral pulses without edema.  Abdomen: Soft, + BS.   Non tender, no guarding, rebound, hernias, masses. Lymphatics: Non tender without lymphadenopathy.  Musculoskeletal: Full ROM, 5/5 strength, normal gait.  Skin: Warm, dry without rashes, lesions, ecchymosis.  Neuro: Cranial nerves intact. Normal muscle tone, no cerebellar symptoms. Sensation intact.  Psych: Awake and oriented X 3, normal affect, Insight and Judgment appropriate.    Quentin Mullingollier, Gracelynn Bircher, PA-C 9:20 AM [facility]

## 2014-04-28 NOTE — Patient Instructions (Addendum)
Your LDL is not in range. Your LDL is the bad cholesterol that can lead to heart attack and stroke. To lower your number you can decrease your fatty foods, red meat, cheese, milk and increase fiber like whole grains and veggies. Continue the Benefiber.   If your labs are very good we can think about cutting back on your pravastatin      Bad carbs also include fruit juice, alcohol, and sweet tea. These are empty calories that do not signal to your brain that you are full.   Please remember the good carbs are still carbs which convert into sugar. So please measure them out no more than 1/2-1 cup of rice, oatmeal, pasta, and beans  Veggies are however free foods! Pile them on.   Not all fruit is created equal. Please see the list below, the fruit at the bottom is higher in sugars than the fruit at the top. Please avoid all dried fruits.

## 2014-04-29 LAB — VITAMIN D 25 HYDROXY (VIT D DEFICIENCY, FRACTURES): VIT D 25 HYDROXY: 48 ng/mL (ref 30–100)

## 2014-04-29 LAB — TSH: TSH: 2.312 u[IU]/mL (ref 0.350–4.500)

## 2014-06-28 ENCOUNTER — Encounter: Payer: Self-pay | Admitting: Internal Medicine

## 2014-08-18 ENCOUNTER — Other Ambulatory Visit: Payer: Self-pay | Admitting: Internal Medicine

## 2014-08-24 ENCOUNTER — Ambulatory Visit (INDEPENDENT_AMBULATORY_CARE_PROVIDER_SITE_OTHER): Payer: Medicare Other | Admitting: Internal Medicine

## 2014-08-24 ENCOUNTER — Encounter: Payer: Self-pay | Admitting: Internal Medicine

## 2014-08-24 VITALS — BP 146/76 | HR 60 | Temp 97.7°F | Resp 16 | Ht 62.5 in | Wt 112.2 lb

## 2014-08-24 DIAGNOSIS — Z9181 History of falling: Secondary | ICD-10-CM

## 2014-08-24 DIAGNOSIS — R7303 Prediabetes: Secondary | ICD-10-CM

## 2014-08-24 DIAGNOSIS — N183 Chronic kidney disease, stage 3 unspecified: Secondary | ICD-10-CM

## 2014-08-24 DIAGNOSIS — E559 Vitamin D deficiency, unspecified: Secondary | ICD-10-CM

## 2014-08-24 DIAGNOSIS — Z1212 Encounter for screening for malignant neoplasm of rectum: Secondary | ICD-10-CM

## 2014-08-24 DIAGNOSIS — E785 Hyperlipidemia, unspecified: Secondary | ICD-10-CM

## 2014-08-24 DIAGNOSIS — R7309 Other abnormal glucose: Secondary | ICD-10-CM

## 2014-08-24 DIAGNOSIS — Z79899 Other long term (current) drug therapy: Secondary | ICD-10-CM

## 2014-08-24 DIAGNOSIS — Z1331 Encounter for screening for depression: Secondary | ICD-10-CM

## 2014-08-24 DIAGNOSIS — I10 Essential (primary) hypertension: Secondary | ICD-10-CM

## 2014-08-24 LAB — HEPATIC FUNCTION PANEL
ALBUMIN: 4.5 g/dL (ref 3.5–5.2)
ALK PHOS: 64 U/L (ref 39–117)
ALT: 13 U/L (ref 0–35)
AST: 22 U/L (ref 0–37)
Bilirubin, Direct: 0.1 mg/dL (ref 0.0–0.3)
Indirect Bilirubin: 0.5 mg/dL (ref 0.2–1.2)
Total Bilirubin: 0.6 mg/dL (ref 0.2–1.2)
Total Protein: 6.9 g/dL (ref 6.0–8.3)

## 2014-08-24 LAB — LIPID PANEL
CHOLESTEROL: 203 mg/dL — AB (ref 0–200)
HDL: 73 mg/dL (ref >=46)
LDL Cholesterol: 113 mg/dL — ABNORMAL HIGH (ref 0–99)
TRIGLYCERIDES: 86 mg/dL (ref <150)
Total CHOL/HDL Ratio: 2.8 Ratio
VLDL: 17 mg/dL (ref 0–40)

## 2014-08-24 LAB — TSH: TSH: 1.922 u[IU]/mL (ref 0.350–4.500)

## 2014-08-24 LAB — CBC WITH DIFFERENTIAL/PLATELET
BASOS PCT: 1 % (ref 0–1)
Basophils Absolute: 0 10*3/uL (ref 0.0–0.1)
EOS ABS: 0 10*3/uL (ref 0.0–0.7)
Eosinophils Relative: 1 % (ref 0–5)
HCT: 36.2 % (ref 36.0–46.0)
HEMOGLOBIN: 11.6 g/dL — AB (ref 12.0–15.0)
Lymphocytes Relative: 32 % (ref 12–46)
Lymphs Abs: 1.2 10*3/uL (ref 0.7–4.0)
MCH: 26.6 pg (ref 26.0–34.0)
MCHC: 32 g/dL (ref 30.0–36.0)
MCV: 83 fL (ref 78.0–100.0)
MPV: 11.2 fL (ref 8.6–12.4)
Monocytes Absolute: 0.3 10*3/uL (ref 0.1–1.0)
Monocytes Relative: 8 % (ref 3–12)
NEUTROS ABS: 2.3 10*3/uL (ref 1.7–7.7)
NEUTROS PCT: 58 % (ref 43–77)
PLATELETS: 197 10*3/uL (ref 150–400)
RBC: 4.36 MIL/uL (ref 3.87–5.11)
RDW: 15.7 % — AB (ref 11.5–15.5)
WBC: 3.9 10*3/uL — AB (ref 4.0–10.5)

## 2014-08-24 LAB — BASIC METABOLIC PANEL WITH GFR
BUN: 24 mg/dL — AB (ref 6–23)
CHLORIDE: 102 meq/L (ref 96–112)
CO2: 29 mEq/L (ref 19–32)
Calcium: 10 mg/dL (ref 8.4–10.5)
Creat: 1 mg/dL (ref 0.50–1.10)
GFR, EST AFRICAN AMERICAN: 61 mL/min
GFR, Est Non African American: 53 mL/min — ABNORMAL LOW
GLUCOSE: 106 mg/dL — AB (ref 70–99)
POTASSIUM: 5.3 meq/L (ref 3.5–5.3)
SODIUM: 139 meq/L (ref 135–145)

## 2014-08-24 LAB — HEMOGLOBIN A1C
Hgb A1c MFr Bld: 5.8 % — ABNORMAL HIGH (ref <5.7)
MEAN PLASMA GLUCOSE: 120 mg/dL — AB (ref <117)

## 2014-08-24 LAB — MAGNESIUM: MAGNESIUM: 2.1 mg/dL (ref 1.5–2.5)

## 2014-08-24 NOTE — Progress Notes (Signed)
Patient ID: Ann KusterInez A Russman, female   DOB: 26-Oct-1930, 81 y.o.   MRN: [id number]  Annual Comprehensive Examination  [facility] very [facility] 81 [facility].  Patient has been followed for HTN, Prediabetes, Hyperlipidemia, and Vitamin D Deficiency.    Labile HTN predates since 1997. Patient's BP has been controlled at home and patient denies any cardiac symptoms as chest pain, palpitations, shortness of breath, dizziness or ankle swelling. Today's BP is slightly elevated at  04/28/2014.   Patient's hyperlipidemia is controlled with diet and medications. Patient denies myalgias or other medication SE's. Last lipids were at goal -  Total  Chol 181; HDL 68; LDL  99; Trig 72 on 04/28/2014.    Patient has prediabetes predating since 2010 with A1c 6.1% and patient denies reactive hypoglycemic symptoms, visual blurring, diabetic polys, or paresthesias. Last A1c was  5.8% on 04/28/2014.     Finally, patient has history of Vitamin D Deficiency of 25 in 2008 and last Vitamin D was  48 on 04/28/2014.      Medication Sig  . ALPRAZolam 1 MG tablet Take 1 tablet 3  times daily as needed for anxiety  . aspirin 81 MG tablet Take 81 mg by mouth daily.  . Biotin 2500 MCG CAPS Take by mouth daily.  Marland Kitchen. VITAMIN D3 2000 UNITS TABS Take by mouth.  Marland Kitchen. CINNAMON PO Take 2,000 mg by mouth daily.  . fish oil 1000 MG capsule Take 1 g by mouth daily  . simvastatin  40 MG tablet TAKE ONE TABLET BY MOUTH NIGHTLY AT BEDTIME   Allergies  Allergen Reactions  . Ace Inhibitors     Hyperkalemia    Past Medical History  Diagnosis Date  . HTN (hypertension)   . Hyperlipidemia   . Vitamin D deficiency   . Degenerative joint disease   . Shingles   . Chronic renal insufficiency   . Anemia of chronic renal failure    Health Maintenance  Topic Date Due  . COLONOSCOPY  12/25/1982  . ZOSTAVAX  12/24/1992  . DEXA SCAN  12/24/1997  . PNA vac Low Risk Adult (2 of 2 - PCV13) 06/04/2011  . INFLUENZA VACCINE   10/30/2014  . TETANUS/TDAP  12/06/2015   Immunization History  Administered Date(s) Administered  . Influenza Split 01/17/2013  . Influenza, High Dose Seasonal PF 01/02/2014  . Pneumococcal Polysaccharide-23 06/04/2010  . Pneumococcal-Unspecified 01/31/1999  . Td 04/01/1995, 12/05/2005   No past surgical history on file. Family History  Problem Relation Age of Onset  . CVA Father   . Cancer Daughter     breast   History  Substance Use Topics  . Smoking status: Never Smoker   . Smokeless tobacco: Never Used  . Alcohol Use: 0.6 oz/week    1 Glasses of wine per week     Comment: occasional wine    ROS Constitutional: Denies fever, chills, weight loss/gain, headaches, insomnia,  night sweats, and change in appetite. Does c/o fatigue. Eyes: Denies redness, blurred vision, diplopia, discharge, itchy, watery eyes.  ENT: Denies discharge, congestion, post nasal drip, epistaxis, sore throat, earache, hearing loss, dental pain, Tinnitus, Vertigo, Sinus pain, snoring.  Cardio: Denies chest pain, palpitations, irregular heartbeat, syncope, dyspnea, diaphoresis, orthopnea, PND, claudication, edema Respiratory: denies cough, dyspnea, DOE, pleurisy, hoarseness, laryngitis, wheezing.  Gastrointestinal: Denies dysphagia, heartburn, reflux, water brash, pain, cramps, nausea, vomiting, bloating, diarrhea, constipation, hematemesis, melena, hematochezia, jaundice, hemorrhoids Genitourinary: Denies dysuria, frequency, urgency, nocturia, hesitancy, discharge, hematuria, flank pain Breast: Breast  lumps, nipple discharge, bleeding.  Musculoskeletal: Denies arthralgia, myalgia, stiffness, Jt. Swelling, pain, limp, and strain/sprain. Denies falls. Skin: Denies puritis, rash, hives, warts, acne, eczema, changing in skin lesion Neuro: No weakness, tremor, incoordination, spasms, paresthesia, pain Psychiatric: Denies confusion, memory loss, sensory loss. Denies Depression. Endocrine: Denies change in  weight, skin, hair change, nocturia, and paresthesia, diabetic polys, visual blurring, hyper / hypo glycemic episodes.  Heme/Lymph: No excessive bleeding, bruising, enlarged lymph nodes.  Physical Exam  BP 146/76   Pulse 60  Temp 97.7 F   Resp 16  Ht 5' 2.5"   Wt 112 lb 3.2 oz    BMI 20.18   General Appearance: Well nourished and in no apparent distress. Eyes: PERRLA, EOMs, conjunctiva no swelling or erythema, normal fundi and vessels. Sinuses: No frontal/maxillary tenderness ENT/Mouth: EACs patent / TMs  nl. Nares clear without erythema, swelling, mucoid exudates. Oral hygiene is good. No erythema, swelling, or exudate. Tongue normal, non-obstructing. Tonsils not swollen or erythematous. Hearing normal.  Neck: Supple, thyroid normal. No bruits, nodes or JVD. Respiratory: Respiratory effort normal.  BS equal and clear bilateral without rales, rhonci, wheezing or stridor. Cardio: Heart sounds are normal with regular rate and rhythm and no murmurs, rubs or gallops. Peripheral pulses are normal and equal bilaterally without edema. No aortic or femoral bruits. Chest: symmetric with normal excursions and percussion. Breasts: Symmetric, without lumps, nipple discharge, retractions, or fibrocystic changes.  Abdomen: Flat, soft, with bowl sounds. Nontender, no guarding, rebound, hernias, masses, or organomegaly.  Lymphatics: Non tender without lymphadenopathy.  Genitourinary:  Musculoskeletal: Full ROM all peripheral extremities, joint stability, 5/5 strength, and normal gait. Skin: Warm and dry without rashes, lesions, cyanosis, clubbing or  ecchymosis.  Neuro: Cranial nerves intact, reflexes equal bilaterally. Normal muscle tone, no cerebellar symptoms. Sensation intact.  Pysch: Awake and oriented X 3, normal affect, Insight and Judgment appropriate.   Assessment and Plan  1. Essential hypertension  - Microalbumin / creatinine urine ratio - EKG 12-Lead - [place], RETROPERITNL ABD,  LTD -  TSH  2. Hyperlipidemia  - Lipid panel  3. Prediabetes  - Hemoglobin A1c - Insulin, random  4. Vitamin D deficiency  - Vit D  25 hydroxy   5. CKD Stage III  (GFR 49 ml/min)   6. Screening for rectal cancer  - POC Hemoccult Bld/Stl   7. Medication management  - Urine Microscopic - CBC with Differential/Platelet - BASIC METABOLIC PANEL WITH GFR - Hepatic function panel - Magnesium  8. Depression screen  - Screen Negative  9. At low risk for fall   Continue prudent diet as discussed, weight control, BP monitoring, regular exercise, and medications. Discussed med's effects and SE's. Screening labs and tests as requested with regular follow-up as recommended.  Over 40 minutes of exam, counseling, chart review was performed.

## 2014-08-24 NOTE — Patient Instructions (Signed)
Recommend Adult Low dose Aspirin or baby Aspirin 81 mg daily   To reduce risk of Colon Cancer 20 %,   Skin Cancer 26 % ,   Melanoma 46%   and   Pancreatic cancer 60%  ++++++++++++++++++  Vitamin D goal is between 70-100.   Please make sure that you are taking your Vitamin D as directed.   It is very important as a natural anti-inflammatory   helping hair, skin, and nails, as well as reducing stroke and heart attack risk.   It helps your bones and helps with mood.  It also decreases numerous cancer risks so please take it as directed.   Low Vit D is associated with a 200-300% higher risk for CANCER   and 200-300% higher risk for HEART   ATTACK  &  STROKE.    ......................................  It is also associated with higher death rate at younger ages,   autoimmune diseases like Rheumatoid arthritis, Lupus, Multiple Sclerosis.     Also many other serious conditions, like depression, Alzheimer's  Dementia, infertility, muscle aches, fatigue, fibromyalgia - just to name a few.  +++++++++++++++++++    Recommend the book "The END of DIETING" by Dr Joel Fuhrman   & the book "The END of DIABETES " by Dr Joel Fuhrman  At [employer] - get book & Audio CD's     Being diabetic has a  300% increased risk for heart attack, stroke, cancer, and alzheimer- type vascular dementia. It is very important that you work harder with diet by avoiding all foods that are white. Avoid white rice (brown & wild rice is OK), white potatoes (sweetpotatoes in moderation is OK), White bread or wheat bread or anything made out of white flour like bagels, donuts, rolls, buns, biscuits, cakes, pastries, cookies, pizza crust, and pasta (made from white flour & egg whites) - vegetarian pasta or spinach or wheat pasta is OK. Multigrain breads like Arnold's or [employer], or multigrain sandwich thins or flatbreads.  Diet, exercise and weight loss can reverse and cure diabetes in the early  stages.  Diet, exercise and weight loss is very important in the control and prevention of complications of diabetes which affects every system in your body, ie. Brain - dementia/stroke, eyes - glaucoma/blindness, heart - heart attack/heart failure, kidneys - dialysis, stomach - gastric paralysis, intestines - malabsorption, nerves - severe painful neuritis, circulation - gangrene & loss of a leg(s), and finally cancer and Alzheimers.    I recommend avoid fried & greasy foods,  sweets/candy, white rice (brown or wild rice or Quinoa is OK), white potatoes (sweet potatoes are OK) - anything made from white flour - bagels, doughnuts, rolls, buns, biscuits,white and wheat breads, pizza crust and traditional pasta made of white flour & egg white(vegetarian pasta or spinach or wheat pasta is OK).  Multi-grain bread is OK - like multi-grain flat bread or sandwich thins. Avoid alcohol in excess. Exercise is also important.    Eat all the vegetables you want - avoid meat, especially red meat and dairy - especially cheese.  Cheese is the most concentrated form of trans-fats which is the worst thing to clog up our arteries. Veggie cheese is OK which can be found in the fresh produce section at [employer] or [employer] or [employer]  ++++++++++++++++++++++++++  Preventive Care for Adults  A healthy lifestyle and preventive care can promote health and wellness. Preventive health guidelines for women include the following key practices.  A routine yearly physical is   a good way to check with your health care provider about your health and preventive screening. It is a chance to share any concerns and updates on your health and to receive a thorough exam.  Visit your dentist for a routine exam and preventive care every 6 months. Brush your teeth twice a day and floss once a day. Good oral hygiene prevents tooth decay and gum disease.  The frequency of eye exams is based on your age, health, family medical  history, use of contact lenses, and other factors. Follow your health care provider's recommendations for frequency of eye exams.  Eat a healthy diet. Foods like vegetables, fruits, whole grains, low-fat dairy products, and lean protein foods contain the nutrients you need without too many calories. Decrease your intake of foods high in solid fats, added sugars, and salt. Eat the right amount of calories for you.Get information about a proper diet from your health care provider, if necessary.  Regular physical exercise is one of the most important things you can do for your health. Most adults should get at least 150 minutes of moderate-intensity exercise (any activity that increases your heart rate and causes you to sweat) each week. In addition, most adults need muscle-strengthening exercises on 2 or more days a week.  Maintain a healthy weight. The body mass index (BMI) is a screening tool to identify possible weight problems. It provides an estimate of body fat based on height and weight. Your health care provider can find your BMI and can help you achieve or maintain a healthy weight.For adults 20 years and older:  A BMI below 18.5 is considered underweight.  A BMI of 18.5 to 24.9 is normal.  A BMI of 25 to 29.9 is considered overweight.  A BMI of 30 and above is considered obese.  Maintain normal blood lipids and cholesterol levels by exercising and minimizing your intake of saturated fat. Eat a balanced diet with plenty of fruit and vegetables. If your lipid or cholesterol levels are high, you are over 50, or you are at high risk for heart disease, you may need your cholesterol levels checked more frequently.Ongoing high lipid and cholesterol levels should be treated with medicines if diet and exercise are not working.  If you smoke, find out from your health care provider how to quit. If you do not use tobacco, do not start.  Lung cancer screening is recommended for adults aged 55-80  years who are at high risk for developing lung cancer because of a history of smoking. A yearly low-dose CT scan of the lungs is recommended for people who have at least a 30-pack-year history of smoking and are a current smoker or have quit within the past 15 years. A pack year of smoking is smoking an average of 1 pack of cigarettes a day for 1 year (for example: 1 pack a day for 30 years or 2 packs a day for 15 years). Yearly screening should continue until the smoker has stopped smoking for at least 15 years. Yearly screening should be stopped for people who develop a health problem that would prevent them from having lung cancer treatment.  Avoid use of street drugs. Do not share needles with anyone. Ask for help if you need support or instructions about stopping the use of drugs.  High blood pressure causes heart disease and increases the risk of stroke.  Ongoing high blood pressure should be treated with medicines if weight loss and exercise do not work.  If   you are 55-79 years old, ask your health care provider if you should take aspirin to prevent strokes.  Diabetes screening involves taking a blood sample to check your fasting blood sugar level. This should be done once every 3 years, after age 45, if you are within normal weight and without risk factors for diabetes. Testing should be considered at a younger age or be carried out more frequently if you are overweight and have at least 1 risk factor for diabetes.  Breast cancer screening is essential preventive care for women. You should practice "breast self-awareness." This means understanding the normal appearance and feel of your breasts and may include breast self-examination. Any changes detected, no matter how small, should be reported to a health care provider. Women in their 20s and 30s should have a clinical breast exam (CBE) by a health care provider as part of a regular health exam every 1 to 3 years. After age 40, women should have a  CBE every year. Starting at age 40, women should consider having a mammogram (breast X-ray test) every year. Women who have a family history of breast cancer should talk to their health care provider about genetic screening. Women at a high risk of breast cancer should talk to their health care providers about having an MRI and a mammogram every year.  Breast cancer gene (BRCA)-related cancer risk assessment is recommended for women who have family members with BRCA-related cancers. BRCA-related cancers include breast, ovarian, tubal, and peritoneal cancers. Having family members with these cancers may be associated with an increased risk for harmful changes (mutations) in the breast cancer genes BRCA1 and BRCA2. Results of the assessment will determine the need for genetic counseling and BRCA1 and BRCA2 testing.  Routine pelvic exams to screen for cancer are no longer recommended for nonpregnant women who are considered low risk for cancer of the pelvic organs (ovaries, uterus, and vagina) and who do not have symptoms. Ask your health care provider if a screening pelvic exam is right for you.  If you have had past treatment for cervical cancer or a condition that could lead to cancer, you need Pap tests and screening for cancer for at least 20 years after your treatment. If Pap tests have been discontinued, your risk factors (such as having a new sexual partner) need to be reassessed to determine if screening should be resumed. Some women have medical problems that increase the chance of getting cervical cancer. In these cases, your health care provider may recommend more frequent screening and Pap tests.    Colorectal cancer can be detected and often prevented. Most routine colorectal cancer screening begins at the age of 50 years and continues through age 75 years. However, your health care provider may recommend screening at an earlier age if you have risk factors for colon cancer. On a yearly basis,  your health care provider may provide home test kits to check for hidden blood in the stool. Use of a small camera at the end of a tube, to directly examine the colon (sigmoidoscopy or colonoscopy), can detect the earliest forms of colorectal cancer. Talk to your health care provider about this at age 50, when routine screening begins. Direct exam of the colon should be repeated every 5-10 years through age 75 years, unless early forms of pre-cancerous polyps or small growths are found.  Osteoporosis is a disease in which the bones lose minerals and strength with aging. This can result in serious bone fractures or breaks. The   risk of osteoporosis can be identified using a bone density scan. Women ages 65 years and over and women at risk for fractures or osteoporosis should discuss screening with their health care providers. Ask your health care provider whether you should take a calcium supplement or vitamin D to reduce the rate of osteoporosis.  Menopause can be associated with physical symptoms and risks. Hormone replacement therapy is available to decrease symptoms and risks. You should talk to your health care provider about whether hormone replacement therapy is right for you.  Use sunscreen. Apply sunscreen liberally and repeatedly throughout the day. You should seek shade when your shadow is shorter than you. Protect yourself by wearing long sleeves, pants, a wide-brimmed hat, and sunglasses year round, whenever you are outdoors.  Once a month, do a whole body skin exam, using a mirror to look at the skin on your back. Tell your health care provider of new moles, moles that have irregular borders, moles that are larger than a pencil eraser, or moles that have changed in shape or color.  Stay current with required vaccines (immunizations).  Influenza vaccine. All adults should be immunized every year.  Tetanus, diphtheria, and acellular pertussis (Td, Tdap) vaccine. Pregnant women should receive  1 dose of Tdap vaccine during each pregnancy. The dose should be obtained regardless of the length of time since the last dose. Immunization is preferred during the 27th-36th week of gestation. An adult who has not previously received Tdap or who does not know her vaccine status should receive 1 dose of Tdap. This initial dose should be followed by tetanus and diphtheria toxoids (Td) booster doses every 10 years. Adults with an unknown or incomplete history of completing a 3-dose immunization series with Td-containing vaccines should begin or complete a primary immunization series including a Tdap dose. Adults should receive a Td booster every 10 years.    Zoster vaccine. One dose is recommended for adults aged 60 years or older unless certain conditions are present.    Pneumococcal 13-valent conjugate (PCV13) vaccine. When indicated, a person who is uncertain of her immunization history and has no record of immunization should receive the PCV13 vaccine. An adult aged 19 years or older who has certain medical conditions and has not been previously immunized should receive 1 dose of PCV13 vaccine. This PCV13 should be followed with a dose of pneumococcal polysaccharide (PPSV23) vaccine. The PPSV23 vaccine dose should be obtained at least 8 weeks after the dose of PCV13 vaccine. An adult aged 19 years or older who has certain medical conditions and previously received 1 or more doses of PPSV23 vaccine should receive 1 dose of PCV13. The PCV13 vaccine dose should be obtained 1 or more years after the last PPSV23 vaccine dose.    Pneumococcal polysaccharide (PPSV23) vaccine. When PCV13 is also indicated, PCV13 should be obtained first. All adults aged 65 years and older should be immunized. An adult younger than age 65 years who has certain medical conditions should be immunized. Any person who resides in a nursing home or long-term care facility should be immunized. An adult smoker should be immunized.  People with an immunocompromised condition and certain other conditions should receive both PCV13 and PPSV23 vaccines. People with human immunodeficiency virus (HIV) infection should be immunized as soon as possible after diagnosis. Immunization during chemotherapy or radiation therapy should be avoided. Routine use of PPSV23 vaccine is not recommended for American Indians, [place] Natives, or people younger than 65 years unless there are medical   conditions that require PPSV23 vaccine. When indicated, people who have unknown immunization and have no record of immunization should receive PPSV23 vaccine. One-time revaccination 5 years after the first dose of PPSV23 is recommended for people aged 19-64 years who have chronic kidney failure, nephrotic syndrome, asplenia, or immunocompromised conditions. People who received 1-2 doses of PPSV23 before age 9 years should receive another dose of PPSV23 vaccine at age 62 years or later if at least 5 years have passed since the previous dose. Doses of PPSV23 are not needed for people immunized with PPSV23 at or after age 53 years.   Preventive Services / Frequency  Ages 32 years and over  Blood pressure check.  Lipid and cholesterol check.  Lung cancer screening. / Every year if you are aged 71-80 years and have a 30-pack-year history of smoking and currently smoke or have quit within the past 15 years. Yearly screening is stopped once you have quit smoking for at least 15 years or develop a health problem that would prevent you from having lung cancer treatment.  Clinical breast exam.** / Every year after age 65 years.  BRCA-related cancer risk assessment.** / For women who have family members with a BRCA-related cancer (breast, ovarian, tubal, or peritoneal cancers).  Mammogram.** / Every year beginning at age 46 years and continuing for as long as you are in good health. Consult with your health care provider.  Pap test.** / Every 3 years starting at  age 71 years through age 47 or 50 years with 3 consecutive normal Pap tests. Testing can be stopped between 65 and 70 years with 3 consecutive normal Pap tests and no abnormal Pap or HPV tests in the past 10 years.  Fecal occult blood test (FOBT) of stool. / Every year beginning at age 44 years and continuing until age 73 years. You may not need to do this test if you get a colonoscopy every 10 years.  Flexible sigmoidoscopy or colonoscopy.** / Every 5 years for a flexible sigmoidoscopy or every 10 years for a colonoscopy beginning at age 37 years and continuing until age 2 years.  Hepatitis C blood test.** / For all people born from [birth year] through 1963 and any individual with known risks for hepatitis C.  Osteoporosis screening.** / A one-time screening for women ages 9 years and over and women at risk for fractures or osteoporosis.  Skin self-exam. / Monthly.  Influenza vaccine. / Every year.  Tetanus, diphtheria, and acellular pertussis (Tdap/Td) vaccine.** / 1 dose of Td every 10 years.  Zoster vaccine.** / 1 dose for adults aged 31 years or older.  Pneumococcal 13-valent conjugate (PCV13) vaccine.** / Consult your health care provider.  Pneumococcal polysaccharide (PPSV23) vaccine.** / 1 dose for all adults aged 32 years and older. Screening for abdominal aortic aneurysm (AAA)  by ultrasound is recommended for people who have history of high blood pressure or who are current or former smokers.

## 2014-08-25 LAB — URINALYSIS, MICROSCOPIC ONLY
Bacteria, UA: NONE SEEN
Casts: NONE SEEN
Crystals: NONE SEEN
SQUAMOUS EPITHELIAL / LPF: NONE SEEN

## 2014-08-25 LAB — MICROALBUMIN / CREATININE URINE RATIO
Creatinine, Urine: 59.1 mg/dL
Microalb Creat Ratio: 30.5 mg/g — ABNORMAL HIGH (ref 0.0–30.0)
Microalb, Ur: 1.8 mg/dL (ref <2.0)

## 2014-08-25 LAB — VITAMIN D 25 HYDROXY (VIT D DEFICIENCY, FRACTURES): Vit D, 25-Hydroxy: 37 ng/mL (ref 30–100)

## 2014-08-25 LAB — INSULIN, RANDOM: Insulin: 4.7 u[IU]/mL (ref 2.0–19.6)

## 2014-09-26 ENCOUNTER — Other Ambulatory Visit (INDEPENDENT_AMBULATORY_CARE_PROVIDER_SITE_OTHER): Payer: Medicare Other

## 2014-09-26 DIAGNOSIS — Z1212 Encounter for screening for malignant neoplasm of rectum: Secondary | ICD-10-CM

## 2014-09-26 LAB — POC HEMOCCULT BLD/STL (HOME/3-CARD/SCREEN)
FECAL OCCULT BLD: NEGATIVE
FECAL OCCULT BLD: NEGATIVE
FECAL OCCULT BLD: NEGATIVE

## 2014-12-01 ENCOUNTER — Ambulatory Visit (INDEPENDENT_AMBULATORY_CARE_PROVIDER_SITE_OTHER): Payer: Medicare Other | Admitting: Internal Medicine

## 2014-12-01 ENCOUNTER — Encounter: Payer: Self-pay | Admitting: Internal Medicine

## 2014-12-01 VITALS — BP 162/80 | HR 64 | Temp 98.4°F | Resp 18 | Ht 62.5 in | Wt 112.0 lb

## 2014-12-01 DIAGNOSIS — R7303 Prediabetes: Secondary | ICD-10-CM

## 2014-12-01 DIAGNOSIS — N183 Chronic kidney disease, stage 3 unspecified: Secondary | ICD-10-CM

## 2014-12-01 DIAGNOSIS — Z79899 Other long term (current) drug therapy: Secondary | ICD-10-CM

## 2014-12-01 DIAGNOSIS — E559 Vitamin D deficiency, unspecified: Secondary | ICD-10-CM | POA: Diagnosis not present

## 2014-12-01 DIAGNOSIS — N189 Chronic kidney disease, unspecified: Secondary | ICD-10-CM | POA: Diagnosis not present

## 2014-12-01 DIAGNOSIS — E785 Hyperlipidemia, unspecified: Secondary | ICD-10-CM

## 2014-12-01 DIAGNOSIS — R7309 Other abnormal glucose: Secondary | ICD-10-CM | POA: Diagnosis not present

## 2014-12-01 DIAGNOSIS — I10 Essential (primary) hypertension: Secondary | ICD-10-CM

## 2014-12-01 LAB — LIPID PANEL
CHOL/HDL RATIO: 2.5 ratio (ref <=5.0)
Cholesterol: 178 mg/dL (ref 125–200)
HDL: 72 mg/dL (ref >=46)
LDL CALC: 92 mg/dL (ref <130)
Triglycerides: 71 mg/dL (ref <150)
VLDL: 14 mg/dL (ref <30)

## 2014-12-01 LAB — CBC WITH DIFFERENTIAL/PLATELET
BASOS ABS: 0 10*3/uL (ref 0.0–0.1)
BASOS PCT: 1 % (ref 0–1)
EOS ABS: 0.1 10*3/uL (ref 0.0–0.7)
Eosinophils Relative: 2 % (ref 0–5)
HCT: 35.4 % — ABNORMAL LOW (ref 36.0–46.0)
HEMOGLOBIN: 11.7 g/dL — AB (ref 12.0–15.0)
LYMPHS ABS: 1.2 10*3/uL (ref 0.7–4.0)
Lymphocytes Relative: 33 % (ref 12–46)
MCH: 27.3 pg (ref 26.0–34.0)
MCHC: 33.1 g/dL (ref 30.0–36.0)
MCV: 82.7 fL (ref 78.0–100.0)
MPV: 11.1 fL (ref 8.6–12.4)
Monocytes Absolute: 0.3 10*3/uL (ref 0.1–1.0)
Monocytes Relative: 8 % (ref 3–12)
NEUTROS PCT: 56 % (ref 43–77)
Neutro Abs: 2 10*3/uL (ref 1.7–7.7)
PLATELETS: 199 10*3/uL (ref 150–400)
RBC: 4.28 MIL/uL (ref 3.87–5.11)
RDW: 14.1 % (ref 11.5–15.5)
WBC: 3.5 10*3/uL — ABNORMAL LOW (ref 4.0–10.5)

## 2014-12-01 LAB — BASIC METABOLIC PANEL WITH GFR
BUN: 22 mg/dL (ref 7–25)
CHLORIDE: 102 mmol/L (ref 98–110)
CO2: 25 mmol/L (ref 20–31)
Calcium: 9.2 mg/dL (ref 8.6–10.4)
Creat: 1.04 mg/dL — ABNORMAL HIGH (ref 0.60–0.88)
GFR, Est African American: 58 mL/min — ABNORMAL LOW (ref >=60)
GFR, Est Non African American: 51 mL/min — ABNORMAL LOW (ref >=60)
Glucose, Bld: 93 mg/dL (ref 65–99)
Potassium: 5 mmol/L (ref 3.5–5.3)
Sodium: 139 mmol/L (ref 135–146)

## 2014-12-01 LAB — MAGNESIUM: Magnesium: 1.8 mg/dL (ref 1.5–2.5)

## 2014-12-01 LAB — HEMOGLOBIN A1C
HEMOGLOBIN A1C: 5.9 % — AB (ref <5.7)
Mean Plasma Glucose: 123 mg/dL — ABNORMAL HIGH (ref <117)

## 2014-12-01 LAB — HEPATIC FUNCTION PANEL
ALK PHOS: 68 U/L (ref 33–130)
ALT: 10 U/L (ref 6–29)
AST: 17 U/L (ref 10–35)
Albumin: 4.1 g/dL (ref 3.6–5.1)
BILIRUBIN DIRECT: 0.1 mg/dL (ref <=0.2)
Indirect Bilirubin: 0.4 mg/dL (ref 0.2–1.2)
Total Bilirubin: 0.5 mg/dL (ref 0.2–1.2)
Total Protein: 6.6 g/dL (ref 6.1–8.1)

## 2014-12-01 LAB — TSH: TSH: 1.642 u[IU]/mL (ref 0.350–4.500)

## 2014-12-01 NOTE — Progress Notes (Signed)
Patient ID: Ann [name], female   DOB: [birth year], 81 y.o.   MRN: [id number]  Assessment and Plan:  Hypertension:  -elevated today in office.  Patient to keep BP chart at home and if continued elevation 150/90 or greater consider low dose ziac -Continue medication,  -monitor blood pressure at home.  -Continue DASH diet.   -Reminder to go to the ER if any CP, SOB, nausea, dizziness, severe HA, changes vision/speech, left arm numbness and tingling, and jaw pain.  Cholesterol: -Continue diet and exercise.  -Check cholesterol.   Pre-diabetes: -Continue diet and exercise.  -Check A1C  Vitamin D Def: -check level -continue medications.   Continue diet and meds as discussed. Further disposition pending results of labs.  HPI 81 y.o. female  presents for 3 month follow up with hypertension, hyperlipidemia, prediabetes and vitamin D.   Her blood pressure has been controlled at home, today their BP is BP: (!) 162/80 mmHg.   She does workout. She denies chest pain, shortness of breath, dizziness.  She reports that she has been doing a lot of yard work lately.     She is on cholesterol medication and denies myalgias. Her cholesterol is at goal. The cholesterol last visit was:   Lab Results  Component Value Date   CHOL 203* 08/24/2014   HDL 73 08/24/2014   LDLCALC 113* 08/24/2014   TRIG 86 08/24/2014   CHOLHDL 2.8 08/24/2014     She has been working on diet and exercise for prediabetes, and denies foot ulcerations, hyperglycemia, hypoglycemia , increased appetite, nausea, paresthesia of the feet, polydipsia, polyuria, visual disturbances, vomiting and weight loss. Last A1C in the office was:  Lab Results  Component Value Date   HGBA1C 5.8* 08/24/2014    Patient is on Vitamin D supplement.  Lab Results  Component Value Date   VD25OH 37 08/24/2014      Current Medications:  Current Outpatient Prescriptions on File Prior to Visit  Medication Sig Dispense Refill  . ALPRAZolam  (XANAX) 1 MG tablet Take 1 tablet (1 mg total) by mouth 3 (three) times daily as needed for anxiety (for sleep and occasional anxiety). 90 tablet 0  . aspirin 81 MG tablet Take 81 mg by mouth daily.    . Biotin 2500 MCG CAPS Take by mouth daily.    . Cholecalciferol (VITAMIN D3) 2000 UNITS TABS Take by mouth.    Marland Kitchen CINNAMON PO Take 2,000 mg by mouth daily.    . fish oil-omega-3 fatty acids 1000 MG capsule Take 1 g by mouth daily. 1400 mg    . simvastatin (ZOCOR) 40 MG tablet TAKE ONE TABLET BY MOUTH NIGHTLY AT BEDTIME 90 tablet 1   No current facility-administered medications on file prior to visit.    Medical History:  Past Medical History  Diagnosis Date  . HTN (hypertension)   . Hyperlipidemia   . Vitamin D deficiency   . Degenerative joint disease   . Shingles   . Chronic renal insufficiency   . Anemia of chronic renal failure     Allergies:  Allergies  Allergen Reactions  . Ace Inhibitors     Hyperkalemia      Review of Systems:  Review of Systems  Constitutional: Negative for fever, chills and malaise/fatigue.  HENT: Negative for congestion, ear pain and sore throat.   Eyes: Negative.   Respiratory: Negative for cough, shortness of breath and wheezing.   Cardiovascular: Negative for chest pain, palpitations and leg swelling.  Gastrointestinal: Negative for  heartburn, diarrhea, constipation, blood in stool and melena.  Genitourinary: Negative.   Skin: Negative.   Neurological: Negative for dizziness, sensory change, loss of consciousness and headaches.  Psychiatric/Behavioral: Negative for depression. The patient is not nervous/anxious and does not have insomnia.     Family history- Review and unchanged  Social history- Review and unchanged  Physical Exam: BP 162/80 mmHg  Pulse 64  Temp(Src) 98.4 F (36.9 C) (Temporal)  Resp 18  Ht 5' 2.5" (1.588 m)  Wt 112 lb (50.803 kg)  BMI 20.15 kg/m2 Wt Readings from Last 3 Encounters:  12/01/14 112 lb (50.803 kg)   08/24/14 112 lb 3.2 oz (50.894 kg)  04/28/14 113 lb (51.256 kg)    General Appearance: Well nourished well developed, in no apparent distress. Eyes: PERRLA, EOMs, conjunctiva no swelling or erythema ENT/Mouth: Ear canals normal without obstruction, swelling, erythma, discharge.  TMs normal bilaterally.  Oropharynx moist, clear, without exudate, or postoropharyngeal swelling. Neck: Supple, thyroid normal,no cervical adenopathy  Respiratory: Respiratory effort normal, Breath sounds clear A&P without rhonchi, wheeze, or rale.  No retractions, no accessory usage. Cardio: RRR with no MRGs. Brisk peripheral pulses without edema.  Abdomen: Soft, + BS,  Non tender, no guarding, rebound, hernias, masses. Musculoskeletal: Full ROM, 5/5 strength, Normal gait Skin: Warm, dry without rashes, lesions, ecchymosis.  Neuro: Awake and oriented X 3, Cranial nerves intact. Normal muscle tone, no cerebellar symptoms. Psych: Normal affect, Insight and Judgment appropriate.    Terri Piedra, PA-C 8:50 AM [facility]

## 2014-12-01 NOTE — Patient Instructions (Signed)
Please monitor your blood pressure at home and call me if it is consistently 150/90 or greater.  If it stays less than 140/85 than no changes in medication are needed.

## 2014-12-02 LAB — INSULIN, RANDOM: Insulin: 4 u[IU]/mL (ref 2.0–19.6)

## 2014-12-02 LAB — VITAMIN D 25 HYDROXY (VIT D DEFICIENCY, FRACTURES): Vit D, 25-Hydroxy: 40 ng/mL (ref 30–100)

## 2015-01-16 ENCOUNTER — Ambulatory Visit (INDEPENDENT_AMBULATORY_CARE_PROVIDER_SITE_OTHER): Payer: Medicare Other

## 2015-01-16 DIAGNOSIS — Z23 Encounter for immunization: Secondary | ICD-10-CM

## 2015-02-21 ENCOUNTER — Other Ambulatory Visit: Payer: Self-pay | Admitting: Internal Medicine

## 2015-03-09 ENCOUNTER — Ambulatory Visit (INDEPENDENT_AMBULATORY_CARE_PROVIDER_SITE_OTHER): Payer: Medicare Other | Admitting: Internal Medicine

## 2015-03-09 VITALS — BP 140/72 | HR 80 | Temp 97.9°F | Resp 16 | Ht 62.0 in | Wt 110.6 lb

## 2015-03-09 DIAGNOSIS — E785 Hyperlipidemia, unspecified: Secondary | ICD-10-CM | POA: Diagnosis not present

## 2015-03-09 DIAGNOSIS — E559 Vitamin D deficiency, unspecified: Secondary | ICD-10-CM

## 2015-03-09 DIAGNOSIS — Z79899 Other long term (current) drug therapy: Secondary | ICD-10-CM | POA: Diagnosis not present

## 2015-03-09 DIAGNOSIS — R7309 Other abnormal glucose: Secondary | ICD-10-CM | POA: Diagnosis not present

## 2015-03-09 DIAGNOSIS — R7303 Prediabetes: Secondary | ICD-10-CM

## 2015-03-09 DIAGNOSIS — Z9181 History of falling: Secondary | ICD-10-CM

## 2015-03-09 DIAGNOSIS — Z1331 Encounter for screening for depression: Secondary | ICD-10-CM

## 2015-03-09 DIAGNOSIS — I10 Essential (primary) hypertension: Secondary | ICD-10-CM

## 2015-03-09 DIAGNOSIS — Z789 Other specified health status: Secondary | ICD-10-CM | POA: Diagnosis not present

## 2015-03-09 DIAGNOSIS — Z1389 Encounter for screening for other disorder: Secondary | ICD-10-CM | POA: Diagnosis not present

## 2015-03-09 DIAGNOSIS — Z6820 Body mass index (BMI) 20.0-20.9, adult: Secondary | ICD-10-CM | POA: Insufficient documentation

## 2015-03-09 LAB — HEPATIC FUNCTION PANEL
ALBUMIN: 4.7 g/dL (ref 3.6–5.1)
ALT: 11 U/L (ref 6–29)
AST: 20 U/L (ref 10–35)
Alkaline Phosphatase: 71 U/L (ref 33–130)
BILIRUBIN DIRECT: 0.1 mg/dL (ref <=0.2)
BILIRUBIN TOTAL: 0.6 mg/dL (ref 0.2–1.2)
Indirect Bilirubin: 0.5 mg/dL (ref 0.2–1.2)
Total Protein: 7.3 g/dL (ref 6.1–8.1)

## 2015-03-09 LAB — BASIC METABOLIC PANEL WITH GFR
BUN: 23 mg/dL (ref 7–25)
CALCIUM: 10.1 mg/dL (ref 8.6–10.4)
CHLORIDE: 102 mmol/L (ref 98–110)
CO2: 26 mmol/L (ref 20–31)
CREATININE: 1.04 mg/dL — AB (ref 0.60–0.88)
GFR, EST AFRICAN AMERICAN: 58 mL/min — AB (ref >=60)
GFR, Est Non African American: 50 mL/min — ABNORMAL LOW (ref >=60)
Glucose, Bld: 104 mg/dL — ABNORMAL HIGH (ref 65–99)
Potassium: 5.3 mmol/L (ref 3.5–5.3)
SODIUM: 141 mmol/L (ref 135–146)

## 2015-03-09 LAB — CBC WITH DIFFERENTIAL/PLATELET
BASOS PCT: 0 % (ref 0–1)
Basophils Absolute: 0 10*3/uL (ref 0.0–0.1)
EOS ABS: 0 10*3/uL (ref 0.0–0.7)
EOS PCT: 1 % (ref 0–5)
HCT: 39 % (ref 36.0–46.0)
Hemoglobin: 12.4 g/dL (ref 12.0–15.0)
LYMPHS ABS: 1.2 10*3/uL (ref 0.7–4.0)
Lymphocytes Relative: 24 % (ref 12–46)
MCH: 26.5 pg (ref 26.0–34.0)
MCHC: 31.8 g/dL (ref 30.0–36.0)
MCV: 83.3 fL (ref 78.0–100.0)
MONOS PCT: 6 % (ref 3–12)
MPV: 11.4 fL (ref 8.6–12.4)
Monocytes Absolute: 0.3 10*3/uL (ref 0.1–1.0)
NEUTROS PCT: 69 % (ref 43–77)
Neutro Abs: 3.3 10*3/uL (ref 1.7–7.7)
PLATELETS: 197 10*3/uL (ref 150–400)
RBC: 4.68 MIL/uL (ref 3.87–5.11)
RDW: 15.5 % (ref 11.5–15.5)
WBC: 4.8 10*3/uL (ref 4.0–10.5)

## 2015-03-09 LAB — HEMOGLOBIN A1C
Hgb A1c MFr Bld: 5.7 % — ABNORMAL HIGH (ref <5.7)
Mean Plasma Glucose: 117 mg/dL — ABNORMAL HIGH (ref <117)

## 2015-03-09 LAB — LIPID PANEL
CHOL/HDL RATIO: 2.5 ratio (ref <=5.0)
CHOLESTEROL: 208 mg/dL — AB (ref 125–200)
HDL: 83 mg/dL (ref >=46)
LDL Cholesterol: 106 mg/dL (ref <130)
Triglycerides: 97 mg/dL (ref <150)
VLDL: 19 mg/dL (ref <30)

## 2015-03-09 LAB — MAGNESIUM: MAGNESIUM: 2.1 mg/dL (ref 1.5–2.5)

## 2015-03-09 LAB — TSH: TSH: 2.358 u[IU]/mL (ref 0.350–4.500)

## 2015-03-09 NOTE — Patient Instructions (Signed)

## 2015-03-10 LAB — INSULIN, RANDOM: INSULIN: 7.4 u[IU]/mL (ref 2.0–19.6)

## 2015-03-10 LAB — VITAMIN D 25 HYDROXY (VIT D DEFICIENCY, FRACTURES): VIT D 25 HYDROXY: 54 ng/mL (ref 30–100)

## 2015-03-11 ENCOUNTER — Encounter: Payer: Self-pay | Admitting: Internal Medicine

## 2015-03-11 NOTE — Progress Notes (Signed)
Patient ID: Ann [name], female   DOB: 1930/04/15, 81 y.o.   MRN: [id number]   [facility] very [facility] 81 [facility] for 6 month follow up with Hypertension, CKD3, Hyperlipidemia, Pre-Diabetes and Vitamin D Deficiency.    Patient is treated for HTN since 1997 & BP has been controlled at home. Today's BP: 140/72 mmHg. Patient has CKD 3 (GFR 51 ml/min) attributed to HTN. Patient has had no complaints of any cardiac type chest pain, palpitations, dyspnea/orthopnea/PND, dizziness, claudication, or dependent edema.   Hyperlipidemia is controlled with diet & meds. Patient denies myalgias or other med SE's. Current  Lipids are near, but not at goal with Cholesterol 208*; HDL 83; LDL 106; Triglycerides 97 on 03/09/2015.   Also, the patient has history of PreDiabetes since 2010 with an A1c of 6.1% and in 2012 with A1c 6.0% and has had no symptoms of reactive hypoglycemia, diabetic polys, paresthesias or visual blurring. Current  A1c is 5.7% - at goal.   Further, the patient also has history of Vitamin D Deficiency of 25 in 2008  and supplements vitamin D without any suspected side-effects. Current vitamin D is 54.     Medication Sig  . ALPRAZolam  1 MG  Take 1 tablet (1 mg total) by mouth 3 (three) times daily as needed for anxiety (for sleep and occasional anxiety).  Marland Kitchen. aspirin 81 MG  Take 81 mg by mouth daily.  . Biotin 2500 MCG  Take by mouth daily.  Marland Kitchen. VITAMIN D 2000 UNITS  Take by mouth. Taking 4000 units daily  . CINNAMON PO Take 2,000 mg by mouth daily.  . fish oil-omega-3  1000 MG  Take 2 g by mouth daily. 1400 mg  . simvastatin  40 MG  TAKE ONE TABLET BY MOUTH NIGHTLY AT BEDTIME   Allergies  Allergen Reactions  . Ace Inhibitors Hyperkalemia   PMHx:   Past Medical History  Diagnosis Date  . HTN (hypertension)   . Hyperlipidemia   . Vitamin D deficiency   . Degenerative joint disease   . Shingles   . Chronic renal insufficiency   . Anemia of chronic renal failure    Immunization  History  Administered Date(s) Administered  . Influenza Split 01/17/2013  . Influenza, High Dose Seasonal PF 01/02/2014, 01/16/2015  . Pneumococcal Polysaccharide-23 06/04/2010  . Pneumococcal-Unspecified 01/31/1999  . Td 04/01/1995, 12/05/2005   No past surgical history on file. FHx:    Reviewed / unchanged  SHx:    Reviewed / unchanged  Systems Review:  Constitutional: Denies fever, chills, wt changes, headaches, insomnia, fatigue, night sweats, change in appetite. Eyes: Denies redness, blurred vision, diplopia, discharge, itchy, watery eyes.  ENT: Denies discharge, congestion, post nasal drip, epistaxis, sore throat, earache, hearing loss, dental pain, tinnitus, vertigo, sinus pain, snoring.  CV: Denies chest pain, palpitations, irregular heartbeat, syncope, dyspnea, diaphoresis, orthopnea, PND, claudication or edema. Respiratory: denies cough, dyspnea, DOE, pleurisy, hoarseness, laryngitis, wheezing.  Gastrointestinal: Denies dysphagia, odynophagia, heartburn, reflux, water brash, abdominal pain or cramps, nausea, vomiting, bloating, diarrhea, constipation, hematemesis, melena, hematochezia  or hemorrhoids. Genitourinary: Denies dysuria, frequency, urgency, nocturia, hesitancy, discharge, hematuria or flank pain. Musculoskeletal: Denies arthralgias, myalgias, stiffness, jt. swelling, pain, limping or strain/sprain. Denies Falls & Fx's. Skin: Denies pruritus, rash, hives, warts, acne, eczema or change in skin lesion(s). Neuro: No weakness, tremor, incoordination, spasms, paresthesia or pain. Psychiatric: Denies confusion, memory loss or sensory loss. Denies Depression or mood changes. Endo: Denies change in weight, skin or hair change.  Heme/Lymph: No excessive bleeding, bruising or enlarged lymph nodes.  Physical Exam  BP 140/72 mmHg  Pulse 80  Temp(Src) 97.9 F (36.6 C)  Resp 16  Ht  (1.575 m)  Wt 110 lb 9.6 oz (50.168 kg)  BMI 20.22 kg/m2  Appears well nourished and  in no distress. Eyes: PERRLA, EOMs, conjunctiva no swelling or erythema. Sinuses: No frontal/maxillary tenderness ENT/Mouth: EAC's clear, TM's nl w/o erythema, bulging. Nares clear w/o erythema, swelling, exudates. Oropharynx clear without erythema or exudates. Oral hygiene is good. Tongue normal, non obstructing. Hearing intact.  Neck: Supple. Thyroid nl. Car 2+/2+ without bruits, nodes or JVD. Chest: Respirations nl with BS clear & equal w/o rales, rhonchi, wheezing or stridor.  Cor: Heart sounds normal w/ regular rate and rhythm without sig. murmurs, gallops, clicks, or rubs. Peripheral pulses normal and equal  without edema.  Abdomen: Soft & bowel sounds normal. Non-tender w/o guarding, rebound, hernias, masses, or organomegaly.  Lymphatics: Unremarkable.  Musculoskeletal: Full ROM all peripheral extremities, joint stability, 5/5 strength, and normal gait.  Skin: Warm, dry without exposed rashes, lesions or ecchymosis apparent.  Neuro: Cranial nerves intact, reflexes equal bilaterally. Sensory-motor testing grossly intact. Tendon reflexes grossly intact.  Pysch: Alert & oriented x 3.  Insight and judgement nl & appropriate. No ideations.  Assessment and Plan:  1. Essential hypertension  - TSH  2. Hyperlipidemia  - Lipid panel - TSH  3. Prediabetes  - Hemoglobin A1c - Insulin, random  4. Vitamin D deficiency  - VITAMIN D 25 Hydroxy   5. Other abnormal glucose   6. Body mass index (BMI) of 20.0-20.9 in adult   7. Medication management  - CBC with Differential/Platelet - BASIC METABOLIC PANEL WITH GFR - Hepatic function panel - Magnesium   Recommended regular exercise, BP monitoring, weight control, and discussed med and SE's. Recommended labs to assess and monitor clinical status. Further disposition pending results of labs. Over 30 minutes of exam, counseling, chart review was performed

## 2015-06-08 ENCOUNTER — Encounter: Payer: Self-pay | Admitting: Internal Medicine

## 2015-06-08 ENCOUNTER — Ambulatory Visit (INDEPENDENT_AMBULATORY_CARE_PROVIDER_SITE_OTHER): Payer: Medicare Other | Admitting: Internal Medicine

## 2015-06-08 VITALS — BP 142/70 | HR 58 | Temp 97.8°F | Resp 16 | Ht 62.0 in | Wt 113.0 lb

## 2015-06-08 DIAGNOSIS — Z0000 Encounter for general adult medical examination without abnormal findings: Secondary | ICD-10-CM

## 2015-06-08 DIAGNOSIS — Z23 Encounter for immunization: Secondary | ICD-10-CM

## 2015-06-08 DIAGNOSIS — Z0001 Encounter for general adult medical examination with abnormal findings: Secondary | ICD-10-CM | POA: Diagnosis not present

## 2015-06-08 DIAGNOSIS — N189 Chronic kidney disease, unspecified: Secondary | ICD-10-CM | POA: Diagnosis not present

## 2015-06-08 DIAGNOSIS — M150 Primary generalized (osteo)arthritis: Secondary | ICD-10-CM | POA: Diagnosis not present

## 2015-06-08 DIAGNOSIS — E559 Vitamin D deficiency, unspecified: Secondary | ICD-10-CM | POA: Diagnosis not present

## 2015-06-08 DIAGNOSIS — R6889 Other general symptoms and signs: Secondary | ICD-10-CM

## 2015-06-08 DIAGNOSIS — R7309 Other abnormal glucose: Secondary | ICD-10-CM | POA: Diagnosis not present

## 2015-06-08 DIAGNOSIS — D631 Anemia in chronic kidney disease: Secondary | ICD-10-CM

## 2015-06-08 DIAGNOSIS — R7303 Prediabetes: Secondary | ICD-10-CM

## 2015-06-08 DIAGNOSIS — Z6820 Body mass index (BMI) 20.0-20.9, adult: Secondary | ICD-10-CM

## 2015-06-08 DIAGNOSIS — E785 Hyperlipidemia, unspecified: Secondary | ICD-10-CM | POA: Diagnosis not present

## 2015-06-08 DIAGNOSIS — M159 Polyosteoarthritis, unspecified: Secondary | ICD-10-CM

## 2015-06-08 DIAGNOSIS — I10 Essential (primary) hypertension: Secondary | ICD-10-CM | POA: Diagnosis not present

## 2015-06-08 DIAGNOSIS — Z79899 Other long term (current) drug therapy: Secondary | ICD-10-CM | POA: Diagnosis not present

## 2015-06-08 DIAGNOSIS — N183 Chronic kidney disease, stage 3 unspecified: Secondary | ICD-10-CM

## 2015-06-08 NOTE — Progress Notes (Signed)
Patient ID: Ann [name], female   DOB: Sep 06, 1930, 82 y.o.   MRN: [id number]  MEDICARE ANNUAL WELLNESS VISIT AND FOLLOW UP  Assessment:    1. Essential hypertension -cont meds -monitor at home -DASH diet   2. Primary osteoarthritis involving multiple joints -tylenol prn  3. CKD Stage III  (GFR 49 ml/min) -avoid NSAIDs -drink plenty of water  4. Anemia of chronic renal failure, stage 3 (moderate) -cont monitoring with cbc  5. Hyperlipidemia -diet and exercise  6. Vitamin D deficiency -cont supplement  7. Medication management -cont lab monitoring  8. Prediabetes -diet and exercise  9. Other abnormal glucose -diet and exercise  10. Body mass index (BMI) of 20.0-20.9 in adult -cont diet and exercise  11. Medicare annual wellness exam -due next year    Over 30 minutes of exam, counseling, chart review, and critical decision making was performed  Plan:   During the course of the visit the patient was educated and counseled about appropriate screening and preventive services including:    Pneumococcal vaccine   Influenza vaccine  Td vaccine  Prevnar 13  Screening electrocardiogram  Screening mammography  Bone densitometry screening  Colorectal cancer screening  Diabetes screening  Glaucoma screening  Nutrition counseling   Advanced directives: given info/requested copies  Conditions/risks identified: Diabetes is at goal, ACE/ARB therapy: No, Reason not on Ace Inhibitor/ARB therapy:  not indicated, allergic Urinary Incontinence is not an issue: discussed non pharmacology and pharmacology options.  Fall risk: low- discussed PT, home fall assessment, medications.    Subjective:   Ann [name] is a 82 y.o. female who presents for Medicare Annual Wellness Visit and 3 month follow up on hypertension, prediabetes, hyperlipidemia, vitamin D def.  Date of last medicare wellness visit is unknown.   Her blood pressure has been controlled  at home, today their BP is BP: (!) 142/70 mmHg She does workout. She denies chest pain, shortness of breath, dizziness.  She is on cholesterol medication and denies myalgias. Her cholesterol is at goal. The cholesterol last visit was:   Lab Results  Component Value Date   CHOL 208* 03/09/2015   HDL 83 03/09/2015   LDLCALC 106 03/09/2015   TRIG 97 03/09/2015   CHOLHDL 2.5 03/09/2015   She has been working on diet and exercise for prediabetes, and denies foot ulcerations, hyperglycemia, hypoglycemia , increased appetite, nausea, paresthesia of the feet, polydipsia, polyuria, visual disturbances, vomiting and weight loss. Last A1C in the office was:  Lab Results  Component Value Date   HGBA1C 5.7* 03/09/2015   Last GFR NonAA   Lab Results  Component Value Date   GFRNONAA 50* 03/09/2015   AA  Lab Results  Component Value Date   GFRAA 58* 03/09/2015   Patient is on Vitamin D supplement. Lab Results  Component Value Date   VD25OH 54 03/09/2015      Medication Review Current Outpatient Prescriptions on File Prior to Visit  Medication Sig Dispense Refill  . ALPRAZolam (XANAX) 1 MG tablet Take 1 tablet (1 mg total) by mouth 3 (three) times daily as needed for anxiety (for sleep and occasional anxiety). 90 tablet 0  . aspirin 81 MG tablet Take 81 mg by mouth daily.    . Biotin 2500 MCG CAPS Take by mouth daily.    . Cholecalciferol (VITAMIN D3) 2000 UNITS TABS Take by mouth. Taking 4000 units daily    . CINNAMON PO Take 2,000 mg by mouth daily.    . fish oil-omega-3  fatty acids 1000 MG capsule Take 2 g by mouth daily. 1400 mg    . simvastatin (ZOCOR) 40 MG tablet TAKE ONE TABLET BY MOUTH NIGHTLY AT BEDTIME 90 tablet 1   No current facility-administered medications on file prior to visit.    Current Problems (verified) Patient Active Problem List   Diagnosis Date Noted  . Medicare annual wellness exam 03/11/2015  . Other abnormal glucose 03/09/2015  . Body mass index (BMI) of  20.0-20.9 in adult 03/09/2015  . Prediabetes 01/01/2014  . Medication management 06/27/2013  . HTN (hypertension)   . Hyperlipidemia   . Vitamin D deficiency   . Degenerative joint disease   . CKD Stage III  (GFR 49 ml/min)   . Anemia of chronic renal failure     Screening Tests Immunization History  Administered Date(s) Administered  . Influenza Split 01/17/2013  . Influenza, High Dose Seasonal PF 01/02/2014, 01/16/2015  . Pneumococcal Polysaccharide-23 06/04/2010  . Pneumococcal-Unspecified 01/31/1999  . Td 04/01/1995, 12/05/2005    Preventative care: Last colonoscopy:  2006 Last mammogram: 2014 DEXA: Declined  Prior vaccinations: TD or Tdap: 2007  Influenza: 2016  Pneumococcal: 2012 Prevnar13: Due today,  Shingles/Zostavax: Declined  Names of Other Physician/Practitioners you currently use: 1. [facility]- here for primary care 2. Dr. Elmer Picker, eye doctor, last visit 2016 3. Dr. Autumn Messing, dentist, last visit 2016 Patient Care Team: Lucky Cowboy, MD as PCP - General (Internal Medicine)  No past surgical history on file. Family History  Problem Relation Age of Onset  . CVA Father   . Cancer Daughter     breast   Social History  Substance Use Topics  . Smoking status: Never Smoker   . Smokeless tobacco: Never Used  . Alcohol Use: 0.6 oz/week    1 Glasses of wine per week     Comment: occasional wine    MEDICARE WELLNESS OBJECTIVES: Tobacco use: She does not smoke.  Patient is not a former smoker. If yes, counseling given Alcohol Current alcohol use: social drinker Osteoporosis: postmenopausal estrogen deficiency, History of fracture in the past year: no Fall risk: Low Risk Hearing: normal Visual acuity: normal,  does perform annual eye exam Diet: well balanced Physical activity:   Cardiac risk factors:   Depression/mood screen:   Depression screen PHQ 2/9 03/11/2015  Decreased Interest 0  Down, Depressed,  Hopeless 0  PHQ - 2 Score 0    ADLs:  In your present state of health, do you have any difficulty performing the following activities: 03/11/2015 08/24/2014  Hearing? N N  Vision? N N  Difficulty concentrating or making decisions? - N  Walking or climbing stairs? N N  Dressing or bathing? N N  Doing errands, shopping? N N     Cognitive Testing  Alert? Yes  Normal Appearance?Yes  Oriented to person? Yes  Place? Yes   Time? Yes  Recall of three objects?  Yes  Can perform simple calculations? Yes  Displays appropriate judgment?Yes  Can read the correct time from a watch face?Yes  EOL planning:     Objective:   Today's Vitals   06/08/15 0900  BP: 142/70  Pulse: 58  Temp: 97.8 F (36.6 C)  TempSrc: Temporal  Resp: 16  Height:  (1.575 m)  Weight: 113 lb (51.256 kg)   Body mass index is 20.66 kg/(m^2).  General appearance: alert, no distress, WD/WN,  female HEENT: normocephalic, sclerae anicteric, TMs pearly, nares patent, no discharge or erythema, pharynx normal  Oral cavity: MMM, no lesions Neck: supple, no lymphadenopathy, no thyromegaly, no masses Heart: RRR, normal S1, S2, no murmurs Lungs: CTA bilaterally, no wheezes, rhonchi, or rales Abdomen: +bs, soft, non tender, non distended, no masses, no hepatomegaly, no splenomegaly Musculoskeletal: nontender, no swelling, no obvious deformity Extremities: no edema, no cyanosis, no clubbing Pulses: 2+ symmetric, upper and lower extremities, normal cap refill Neurological: alert, oriented x 3, CN2-12 intact, strength normal upper extremities and lower extremities, sensation normal throughout, DTRs 2+ throughout, no cerebellar signs, gait normal Psychiatric: normal affect, behavior normal, pleasant  Breast: defer Gyn: defer Rectal: defer   Medicare Attestation I have personally reviewed: The patient's medical and social history Their use of alcohol, tobacco or illicit drugs Their current medications and  supplements The patient's functional ability including ADLs,fall risks, home safety risks, cognitive, and hearing and visual impairment Diet and physical activities Evidence for depression or mood disorders  The patient's weight, height, BMI, and visual acuity have been recorded in the chart.  I have made referrals, counseling, and provided education to the patient based on review of the above and I have provided the patient with a written personalized care plan for preventive services.     Terri Piedra, PA-C   06/08/2015

## 2015-06-08 NOTE — Progress Notes (Deleted)
Assessment and Plan:  Hypertension:  -Continue medication,  -monitor blood pressure at home.  -Continue DASH diet.   -Reminder to go to the ER if any CP, SOB, nausea, dizziness, severe HA, changes vision/speech, left arm numbness and tingling, and jaw pain.  Cholesterol: -Continue diet and exercise.  -Check cholesterol.   Pre-diabetes: -Continue diet and exercise.  -Check A1C  Vitamin D Def: -check level -continue medications.   Continue diet and meds as discussed. Further disposition pending results of labs.  HPI 82 y.o. female  presents for 3 month follow up with hypertension, hyperlipidemia, prediabetes and vitamin D.   Her blood pressure {HAS HAS NOT:18834} been controlled at home, today their BP is  .   She {DOES_DOES ZOX:09604} workout. She denies chest pain, shortness of breath, dizziness.   She {ACTION; IS/IS VWU:98119147} on cholesterol medication and denies myalgias. Her cholesterol {ACTION; IS/IS NOT:21021397} at goal. The cholesterol last visit was:   Lab Results  Component Value Date   CHOL 208* 03/09/2015   HDL 83 03/09/2015   LDLCALC 106 03/09/2015   TRIG 97 03/09/2015   CHOLHDL 2.5 03/09/2015     She {Has/has not:18111} been working on diet and exercise for prediabetes, and denies {Symptoms; diabetes w/o none:19199}. Last A1C in the office was:  Lab Results  Component Value Date   HGBA1C 5.7* 03/09/2015    Patient is on Vitamin D supplement.  Lab Results  Component Value Date   VD25OH 54 03/09/2015      Current Medications:  Current Outpatient Prescriptions on File Prior to Visit  Medication Sig Dispense Refill  . ALPRAZolam (XANAX) 1 MG tablet Take 1 tablet (1 mg total) by mouth 3 (three) times daily as needed for anxiety (for sleep and occasional anxiety). 90 tablet 0  . aspirin 81 MG tablet Take 81 mg by mouth daily.    . Biotin 2500 MCG CAPS Take by mouth daily.    . Cholecalciferol (VITAMIN D3) 2000 UNITS TABS Take by mouth. Taking 4000 units  daily    . CINNAMON PO Take 2,000 mg by mouth daily.    . fish oil-omega-3 fatty acids 1000 MG capsule Take 2 g by mouth daily. 1400 mg    . simvastatin (ZOCOR) 40 MG tablet TAKE ONE TABLET BY MOUTH NIGHTLY AT BEDTIME 90 tablet 1   No current facility-administered medications on file prior to visit.    Medical History:  Past Medical History  Diagnosis Date  . HTN (hypertension)   . Hyperlipidemia   . Vitamin D deficiency   . Degenerative joint disease   . Shingles   . Chronic renal insufficiency   . Anemia of chronic renal failure     Allergies:  Allergies  Allergen Reactions  . Ace Inhibitors     Hyperkalemia      Review of Systems:  ROS  Family history- Review and unchanged  Social history- Review and unchanged  Physical Exam: There were no vitals taken for this visit. Wt Readings from Last 3 Encounters:  03/09/15 110 lb 9.6 oz (50.168 kg)  12/01/14 112 lb (50.803 kg)  08/24/14 112 lb 3.2 oz (50.894 kg)    General Appearance: Well nourished well developed, in no apparent distress. Eyes: PERRLA, EOMs, conjunctiva no swelling or erythema ENT/Mouth: Ear canals normal without obstruction, swelling, erythma, discharge.  TMs normal bilaterally.  Oropharynx moist, clear, without exudate, or postoropharyngeal swelling. Neck: Supple, thyroid normal,no cervical adenopathy  Respiratory: Respiratory effort normal, Breath sounds clear A&P without rhonchi, wheeze,  or rale.  No retractions, no accessory usage. Cardio: RRR with no MRGs. Brisk peripheral pulses without edema.  Abdomen: Soft, + BS,  Non tender, no guarding, rebound, hernias, masses. Musculoskeletal: Full ROM, 5/5 strength, {PSY - GAIT AND STATION:22860} gait Skin: Warm, dry without rashes, lesions, ecchymosis.  Neuro: Awake and oriented X 3, Cranial nerves intact. Normal muscle tone, no cerebellar symptoms. Psych: Normal affect, Insight and Judgment appropriate.    Terri Piedraourtney Forcucci, PA-C 8:58 AM [facility]

## 2015-07-30 ENCOUNTER — Telehealth: Payer: Self-pay | Admitting: *Deleted

## 2015-07-30 NOTE — Telephone Encounter (Signed)
Patient called and states she has had fluid in her right ear x 1 week.  Per Dr Oneta RackMcKeown, patient should try OTC Sudafed PE and call back if fluid continues.  Patient advised.

## 2015-09-10 ENCOUNTER — Encounter: Payer: Self-pay | Admitting: Internal Medicine

## 2015-09-10 ENCOUNTER — Other Ambulatory Visit: Payer: Self-pay | Admitting: *Deleted

## 2015-09-10 ENCOUNTER — Ambulatory Visit (INDEPENDENT_AMBULATORY_CARE_PROVIDER_SITE_OTHER): Payer: Medicare Other | Admitting: Internal Medicine

## 2015-09-10 VITALS — BP 142/80 | HR 64 | Temp 97.2°F | Resp 16 | Ht 62.0 in | Wt 113.2 lb

## 2015-09-10 DIAGNOSIS — Z79899 Other long term (current) drug therapy: Secondary | ICD-10-CM

## 2015-09-10 DIAGNOSIS — N183 Chronic kidney disease, stage 3 unspecified: Secondary | ICD-10-CM

## 2015-09-10 DIAGNOSIS — Z136 Encounter for screening for cardiovascular disorders: Secondary | ICD-10-CM

## 2015-09-10 DIAGNOSIS — I10 Essential (primary) hypertension: Secondary | ICD-10-CM | POA: Diagnosis not present

## 2015-09-10 DIAGNOSIS — E785 Hyperlipidemia, unspecified: Secondary | ICD-10-CM

## 2015-09-10 DIAGNOSIS — E2839 Other primary ovarian failure: Secondary | ICD-10-CM

## 2015-09-10 DIAGNOSIS — N6019 Diffuse cystic mastopathy of unspecified breast: Secondary | ICD-10-CM

## 2015-09-10 DIAGNOSIS — Z1212 Encounter for screening for malignant neoplasm of rectum: Secondary | ICD-10-CM

## 2015-09-10 DIAGNOSIS — N2889 Other specified disorders of kidney and ureter: Secondary | ICD-10-CM

## 2015-09-10 DIAGNOSIS — Z0000 Encounter for general adult medical examination without abnormal findings: Secondary | ICD-10-CM

## 2015-09-10 DIAGNOSIS — Z0001 Encounter for general adult medical examination with abnormal findings: Secondary | ICD-10-CM

## 2015-09-10 DIAGNOSIS — E559 Vitamin D deficiency, unspecified: Secondary | ICD-10-CM

## 2015-09-10 LAB — HEMOGLOBIN A1C
HEMOGLOBIN A1C: 5.9 % — AB (ref <5.7)
Mean Plasma Glucose: 123 mg/dL

## 2015-09-10 LAB — CBC WITH DIFFERENTIAL/PLATELET
BASOS ABS: 0 {cells}/uL (ref 0–200)
BASOS PCT: 0 %
EOS ABS: 48 {cells}/uL (ref 15–500)
Eosinophils Relative: 1 %
HCT: 38.2 % (ref 35.0–45.0)
HEMOGLOBIN: 12 g/dL (ref 11.7–15.5)
LYMPHS ABS: 1440 {cells}/uL (ref 850–3900)
Lymphocytes Relative: 30 %
MCH: 26.8 pg — AB (ref 27.0–33.0)
MCHC: 31.4 g/dL — ABNORMAL LOW (ref 32.0–36.0)
MCV: 85.3 fL (ref 80.0–100.0)
MPV: 11.7 fL (ref 7.5–12.5)
Monocytes Absolute: 288 cells/uL (ref 200–950)
Monocytes Relative: 6 %
NEUTROS ABS: 3024 {cells}/uL (ref 1500–7800)
Neutrophils Relative %: 63 %
Platelets: 193 10*3/uL (ref 140–400)
RBC: 4.48 MIL/uL (ref 3.80–5.10)
RDW: 15.3 % — ABNORMAL HIGH (ref 11.0–15.0)
WBC: 4.8 10*3/uL (ref 3.8–10.8)

## 2015-09-10 LAB — URINALYSIS, ROUTINE W REFLEX MICROSCOPIC
Bilirubin Urine: NEGATIVE
GLUCOSE, UA: NEGATIVE
Hgb urine dipstick: NEGATIVE
LEUKOCYTES UA: NEGATIVE
Nitrite: NEGATIVE
PH: 6 (ref 5.0–8.0)
SPECIFIC GRAVITY, URINE: 1.016 (ref 1.001–1.035)

## 2015-09-10 LAB — HEPATIC FUNCTION PANEL
ALK PHOS: 71 U/L (ref 33–130)
ALT: 12 U/L (ref 6–29)
AST: 20 U/L (ref 10–35)
Albumin: 4.6 g/dL (ref 3.6–5.1)
BILIRUBIN DIRECT: 0.1 mg/dL (ref <=0.2)
Indirect Bilirubin: 0.4 mg/dL (ref 0.2–1.2)
Total Bilirubin: 0.5 mg/dL (ref 0.2–1.2)
Total Protein: 6.9 g/dL (ref 6.1–8.1)

## 2015-09-10 LAB — BASIC METABOLIC PANEL WITH GFR
BUN: 23 mg/dL (ref 7–25)
CHLORIDE: 102 mmol/L (ref 98–110)
CO2: 26 mmol/L (ref 20–31)
CREATININE: 0.94 mg/dL — AB (ref 0.60–0.88)
Calcium: 9.9 mg/dL (ref 8.6–10.4)
GFR, Est African American: 65 mL/min (ref >=60)
GFR, Est Non African American: 57 mL/min — ABNORMAL LOW (ref >=60)
Glucose, Bld: 89 mg/dL (ref 65–99)
POTASSIUM: 5.4 mmol/L — AB (ref 3.5–5.3)
SODIUM: 140 mmol/L (ref 135–146)

## 2015-09-10 LAB — LIPID PANEL
CHOL/HDL RATIO: 2.3 ratio (ref <=5.0)
Cholesterol: 187 mg/dL (ref 125–200)
HDL: 81 mg/dL (ref >=46)
LDL CALC: 89 mg/dL (ref <130)
Triglycerides: 84 mg/dL (ref <150)
VLDL: 17 mg/dL (ref <30)

## 2015-09-10 LAB — TSH: TSH: 1.71 m[IU]/L

## 2015-09-10 LAB — MAGNESIUM: MAGNESIUM: 2.3 mg/dL (ref 1.5–2.5)

## 2015-09-10 MED ORDER — SIMVASTATIN 40 MG PO TABS: 40.0000 mg | ORAL_TABLET | Freq: Every day | ORAL | Status: DC

## 2015-09-10 NOTE — Progress Notes (Signed)
Patient ID: Ann [name], female   DOB: 12-22-1930, 82 y.o.   MRN: [id number]  [facility]                   Lucky CowboyWilliam Madalynn Pickelsimer, M.D.    Dyanne CarrelAmanda R. Steffanie Dunnollier, P.A.-C      Terri Piedraourtney Forcucci, P.A.-C   [facility]                [address]                [place], [place] [id number] Telephone [phone] Telefax [phone]  ______________________________________________________________________  Annual Screening/Preventative Visit And Comprehensive Evaluation &  Examination     This very nice 82 y.o.female presents for a Wellness/Preventative Visit & comprehensive evaluation and management of multiple medical co-morbidities.  Patient has been followed for HTN, T2_NIDDM  Prediabetes, Hyperlipidemia and Vitamin D Deficiency.      HTN predates since 1997. Patient's BP has been controlled at home and patient denies any cardiac symptoms as chest pain, palpitations, shortness of breath, dizziness or ankle swelling. Today's BP is 142/80.       Patient's hyperlipidemia is near controlled with diet. Patient denies myalgias or other medication SE's. Last lipids were near goal with  Cholesterol 208*; HDL 83; and slightly elevated LDL 106; Triglycerides 97 on 03/09/2015.     Patient has prediabetes predating since 2010 with A1c 6.1% and patient denies reactive hypoglycemic symptoms, visual blurring, diabetic polys, or paresthesias. Last A1c was markedly improved 5.7% on 03/09/2015.     Finally, patient has history of Vitamin D Deficiency of "25" in 2008 and last Vitamin D was 54 on 03/09/2015.   Medication Sig  . aspirin 81 MG t Take 81 mg by mouth daily.  . Biotin 2500 MCG  Take by mouth daily.  Marland Kitchen. VITAMIN D 2000 UNITS  Take by mouth. Taking 4000 units daily  . CINNAMON  Take 2,000 mg by mouth daily.  . fish oil-omega-3  1000 MG  Take 2 g by mouth daily. 1400 mg   Allergies  Allergen Reactions  . Ace Inhibitors Hyperkalemia   Past  Medical History  Diagnosis Date  . HTN (hypertension)   . Hyperlipidemia   . Vitamin D deficiency   . Degenerative joint disease   . Shingles   . Chronic renal insufficiency   . Anemia of chronic renal failure    Health Maintenance  Topic Date Due  . DEXA SCAN  Scheduled after today  . ZOSTAVAX  03/31/2018 (Originally 12/24/1992)  . INFLUENZA VACCINE  10/30/2015  . TETANUS/TDAP  12/06/2015  . PNA vac Low Risk Adult  Completed   Immunization History  Administered Date(s) Administered  . Influenza Split 01/17/2013  . Influenza, High Dose Seasonal PF 01/02/2014, 01/16/2015  . Pneumococcal Conjugate-13 06/08/2015  . Pneumococcal Polysaccharide-23 06/04/2010  . Pneumococcal-Unspecified 01/31/1999  . Td 04/01/1995, 12/05/2005   No past surgical history on file. Family History  Problem Relation Age of Onset  . CVA Father   . Cancer Daughter     breast   Social History  Substance Use Topics  . Smoking status: Never Smoker   . Smokeless tobacco: Never Used  . Alcohol Use: 0.6 oz/week    1 Glasses of wine per week     Comment: occasional wine    ROS Constitutional: Denies fever, chills, weight loss/gain, headaches, insomnia,  night sweats, and change in appetite. Does c/o fatigue. Eyes: Denies redness, blurred vision,  diplopia, discharge, itchy, watery eyes.  ENT: Denies discharge, congestion, post nasal drip, epistaxis, sore throat, earache, hearing loss, dental pain, Tinnitus, Vertigo, Sinus pain, snoring.  Cardio: Denies chest pain, palpitations, irregular heartbeat, syncope, dyspnea, diaphoresis, orthopnea, PND, claudication, edema Respiratory: denies cough, dyspnea, DOE, pleurisy, hoarseness, laryngitis, wheezing.  Gastrointestinal: Denies dysphagia, heartburn, reflux, water brash, pain, cramps, nausea, vomiting, bloating, diarrhea, constipation, hematemesis, melena, hematochezia, jaundice, hemorrhoids Genitourinary: Denies dysuria, frequency, urgency, nocturia, hesitancy,  discharge, hematuria, flank pain Breast: Breast lumps, nipple discharge, bleeding.  Musculoskeletal: Denies arthralgia, myalgia, stiffness, Jt. Swelling, pain, limp, and strain/sprain. Denies falls. Skin: Denies puritis, rash, hives, warts, acne, eczema, changing in skin lesion Neuro: No weakness, tremor, incoordination, spasms, paresthesia, pain Psychiatric: Denies confusion, memory loss, sensory loss. Denies Depression. Endocrine: Denies change in weight, skin, hair change, nocturia, and paresthesia, diabetic polys, visual blurring, hyper / hypo glycemic episodes.  Heme/Lymph: No excessive bleeding, bruising, enlarged lymph nodes.  Physical Exam  BP 142/80 mmHg  Pulse 64  Temp(Src) 97.2 F (36.2 C)  Resp 16  Ht  (1.575 m)  Wt 113 lb 3.2 oz (51.347 kg)  BMI 20.70 kg/m2  General Appearance: Well nourished and in no apparent distress.  Eyes: PERRLA, EOMs, conjunctiva no swelling or erythema, normal fundi and vessels. Sinuses: No frontal/maxillary tenderness ENT/Mouth: EACs patent / TMs  nl. Nares clear without erythema, swelling, mucoid exudates. Oral hygiene is good. No erythema, swelling, or exudate. Tongue normal, non-obstructing. Tonsils not swollen or erythematous. Hearing normal.  Neck: Supple, thyroid normal. No bruits, nodes or JVD. Respiratory: Respiratory effort normal.  BS equal and clear bilateral without rales, rhonci, wheezing or stridor. Cardio: Heart sounds are normal with regular rate and rhythm and no murmurs, rubs or gallops. Peripheral pulses are normal and equal bilaterally without edema. No aortic or femoral bruits. Chest: symmetric with normal excursions and percussion. Breasts: Symmetric, without lumps, nipple discharge, retractions, or fibrocystic changes.  Abdomen: Flat, soft with bowel sounds active. Nontender, no guarding, rebound, hernias, masses, or organomegaly.  Lymphatics: Non tender without lymphadenopathy.  Musculoskeletal: Full ROM all  peripheral extremities, joint stability, 5/5 strength, and normal gait. Skin: Warm and dry without rashes, lesions, cyanosis, clubbing or  ecchymosis.  Neuro: Cranial nerves intact, reflexes equal bilaterally. Normal muscle tone, no cerebellar symptoms. Sensation intact.  Pysch: Alert and oriented X 3, normal affect, Insight and Judgment appropriate.   Assessment and Plan  1. Annual Preventative Screening Examination   2. Essential hypertension  - Microalbumin / creatinine urine ratio - EKG 12-Lead - [place], RETROPERITNL ABD,  LTD - TSH  3. Hyperlipidemia  - Lipid panel - TSH  4. Vitamin D deficiency  - Hemoglobin A1c - Insulin, random  5. CKD Stage III  (GFR 49 ml/min)  - VITAMIN D 25 Hydroxy   6. Screening for rectal cancer  - POC Hemoccult Bld/Stl   7. Medication management  - Urinalysis, Routine w reflex microscopic - CBC with Differential/Platelet - BASIC METABOLIC PANEL WITH GFR - Hepatic function panel - Magnesium   Continue prudent diet as discussed, weight control, BP monitoring, regular exercise, and medications. Discussed med's effects and SE's. Screening labs and tests as requested with regular follow-up as recommended. Over 40 minutes of exam, counseling, chart review and high complex critical decision making was performed. Patient had last MGM in 2014 and never had a dexaBMD and is agreeable to having both studies.

## 2015-09-10 NOTE — Patient Instructions (Signed)
Recommend Adult Low Dose Aspirin or   coated  Aspirin 81 mg daily   To reduce risk of Colon Cancer 20 %,   Skin Cancer 26 % ,   Melanoma 46%   and   Pancreatic cancer 60%   ++++++++++++++++++++++++++++++++++++++++++++++++++++++ Vitamin D goal   is between 70-100.   Please make sure that you are taking your Vitamin D as directed.   It is very important as a natural anti-inflammatory   helping hair, skin, and nails, as well as reducing stroke and heart attack risk.   It helps your bones and helps with mood.  It also decreases numerous cancer risks so please take it as directed.   Low Vit D is associated with a 200-300% higher risk for CANCER   and 200-300% higher risk for HEART   ATTACK  &  STROKE.   .....................................Marland Kitchen  It is also associated with higher death rate at younger ages,   autoimmune diseases like Rheumatoid arthritis, Lupus, Multiple Sclerosis.     Also many other serious conditions, like depression, Alzheimer's  Dementia, infertility, muscle aches, fatigue, fibromyalgia - just to name a few.  ++++++++++++++++++++++++++++++++++++++++++++++++  Recommend the book "The END of DIETING" by Dr Excell Seltzer   & the book "The END of DIABETES " by Dr Excell Seltzer  At [email or web address] - get book & Audio CD's     Being diabetic has a  300% increased risk for heart attack, stroke, cancer, and alzheimer- type vascular dementia. It is very important that you work harder with diet by avoiding all foods that are white. Avoid white rice (brown & wild rice is OK), white potatoes (sweetpotatoes in moderation is OK), White bread or wheat bread or anything made out of white flour like bagels, donuts, rolls, buns, biscuits, cakes, pastries, cookies, pizza crust, and pasta (made from white flour & egg whites) - vegetarian pasta or spinach or wheat pasta is OK. Multigrain breads like Arnold's or Pepperidge [employer], or multigrain sandwich thins or flatbreads.  Diet,  exercise and weight loss can reverse and cure diabetes in the early stages.  Diet, exercise and weight loss is very important in the control and prevention of complications of diabetes which affects every system in your body, ie. Brain - dementia/stroke, eyes - glaucoma/blindness, heart - heart attack/heart failure, kidneys - dialysis, stomach - gastric paralysis, intestines - malabsorption, nerves - severe painful neuritis, circulation - gangrene & loss of a leg(s), and finally cancer and Alzheimers.    I recommend avoid fried & greasy foods,  sweets/candy, white rice (brown or wild rice or Quinoa is OK), white potatoes (sweet potatoes are OK) - anything made from white flour - bagels, doughnuts, rolls, buns, biscuits,white and wheat breads, pizza crust and traditional pasta made of white flour & egg white(vegetarian pasta or spinach or wheat pasta is OK).  Multi-grain bread is OK - like multi-grain flat bread or sandwich thins. Avoid alcohol in excess. Exercise is also important.    Eat all the vegetables you want - avoid meat, especially red meat and dairy - especially cheese.  Cheese is the most concentrated form of trans-fats which is the worst thing to clog up our arteries. Veggie cheese is OK which can be found in the fresh produce section at [employer] or [employer] or [employer]  ++++++++++++++++++++++++++++++++++++++++++++++++++ DASH Eating Plan  DASH stands for "Dietary Approaches to Stop Hypertension."   The DASH eating plan is a healthy eating plan that has been shown to reduce high blood  pressure (hypertension). Additional health benefits may include reducing the risk of type 2 diabetes mellitus, heart disease, and stroke. The DASH eating plan may also help with weight loss.  WHAT DO I NEED TO KNOW ABOUT THE DASH EATING PLAN?  For the DASH eating plan, you will follow these general guidelines:  Choose foods with a percent daily value for sodium of less than 5% (as listed on the food  label).  Use salt-free seasonings or herbs instead of table salt or sea salt.  Check with your health care provider or pharmacist before using salt substitutes.  Eat lower-sodium products, often labeled as "lower sodium" or "no salt added."  Eat fresh foods.  Eat more vegetables, fruits, and low-fat dairy products.    Choose whole grains. Look for the word "whole" as the first word in the ingredient list.  Choose fish   Limit sweets, desserts, sugars, and sugary drinks.  Choose heart-healthy fats.  Eat veggie cheese   Eat more home-cooked food and less restaurant, buffet, and fast food.  Limit fried foods.  [place]uffaker foods using methods other than frying.  Limit canned vegetables. If you do use them, rinse them well to decrease the sodium.  When eating at a restaurant, ask that your food be prepared with less salt, or no salt if possible.                      WHAT FOODS CAN I EAT?  Read Dr Fara Olden Fuhrman's books on The End of Dieting & The End of Diabetes  Grains  Whole grain or whole wheat bread. Brown rice. Whole grain or whole wheat pasta. Quinoa, bulgur, and whole grain cereals. Low-sodium cereals. Corn or whole wheat flour tortillas. Whole grain cornbread. Whole grain crackers. Low-sodium crackers.  Vegetables  Fresh or frozen vegetables (raw, steamed, roasted, or grilled). Low-sodium or reduced-sodium tomato and vegetable juices. Low-sodium or reduced-sodium tomato sauce and paste. Low-sodium or reduced-sodium canned vegetables.   Fruits  All fresh, canned (in natural juice), or frozen fruits.  Protein Products   All fish and seafood.  Dried beans, peas, or lentils. Unsalted nuts and seeds. Unsalted canned beans.  Dairy  Low-fat dairy products, such as skim or 1% milk, 2% or reduced-fat cheeses, low-fat ricotta or cottage cheese, or plain low-fat yogurt. Low-sodium or reduced-sodium cheeses.  Fats and Oils  Tub margarines without trans fats. Light or  reduced-fat mayonnaise and salad dressings (reduced sodium). Avocado. Safflower, olive, or canola oils. Natural peanut or almond butter.  Other  Unsalted popcorn and pretzels. The items listed above may not be a complete list of recommended foods or beverages. Contact your dietitian for more options.  +++++++++++++++++++++++++++++++++++++++++++  WHAT FOODS ARE NOT RECOMMENDED?  Grains/ White flour or wheat flour  White bread. White pasta. White rice. Refined cornbread. Bagels and croissants. Crackers that contain trans fat.  Vegetables  Creamed or fried vegetables. Vegetables in a . Regular canned vegetables. Regular canned tomato sauce and paste. Regular tomato and vegetable juices.  Fruits  Dried fruits. Canned fruit in light or heavy syrup. Fruit juice.  Meat and Other Protein Products  Meat in general - RED mwaet & White meat.  Fatty cuts of meat. Ribs, chicken wings, bacon, sausage, bologna, salami, chitterlings, fatback, hot dogs, bratwurst, and packaged luncheon meats.  Dairy  Whole or 2% milk, cream, half-and-half, and cream cheese. Whole-fat or sweetened yogurt. Full-fat cheeses or blue cheese. Nondairy creamers and whipped toppings. Processed cheese, cheese spreads, or  cheese curds.  Condiments  Onion and garlic salt, seasoned salt, table salt, and sea salt. Canned and packaged gravies. Worcestershire sauce. Tartar sauce. Barbecue sauce. Teriyaki sauce. Soy sauce, including reduced sodium. Steak sauce. Fish sauce. Oyster sauce. Cocktail sauce. Horseradish. Ketchup and mustard. Meat flavorings and tenderizers. Bouillon cubes. Hot sauce. Tabasco sauce. Marinades. Taco seasonings. Relishes.  Fats and Oils Butter, stick margarine, lard, shortening and bacon fat. Coconut, palm kernel, or palm oils. Regular salad dressings.  Pickles and olives. Salted popcorn and pretzels.  The items listed above may not be a complete list of foods and beverages to avoid.   Preventive  Care for Adults  A healthy lifestyle and preventive care can promote health and wellness. Preventive health guidelines for women include the following key practices.  A routine yearly physical is a good way to check with your health care provider about your health and preventive screening. It is a chance to share any concerns and updates on your health and to receive a thorough exam.  Visit your dentist for a routine exam and preventive care every 6 months. Brush your teeth twice a day and floss once a day. Good oral hygiene prevents tooth decay and gum disease.  The frequency of eye exams is based on your age, health, family medical history, use of contact lenses, and other factors. Follow your health care provider's recommendations for frequency of eye exams.  Eat a healthy diet. Foods like vegetables, fruits, whole grains, low-fat dairy products, and lean protein foods contain the nutrients you need without too many calories. Decrease your intake of foods high in solid fats, added sugars, and salt. Eat the right amount of calories for you.Get information about a proper diet from your health care provider, if necessary.  Regular physical exercise is one of the most important things you can do for your health. Most adults should get at least 150 minutes of moderate-intensity exercise (any activity that increases your heart rate and causes you to sweat) each week. In addition, most adults need muscle-strengthening exercises on 2 or more days a week.  Maintain a healthy weight. The body mass index (BMI) is a screening tool to identify possible weight problems. It provides an estimate of body fat based on height and weight. Your health care provider can find your BMI and can help you achieve or maintain a healthy weight.For adults 20 years and older:  A BMI below 18.5 is considered underweight.  A BMI of 18.5 to 24.9 is normal.  A BMI of 25 to 29.9 is considered overweight.  A BMI of 30 and  above is considered obese.  Maintain normal blood lipids and cholesterol levels by exercising and minimizing your intake of saturated fat. Eat a balanced diet with plenty of fruit and vegetables. If your lipid or cholesterol levels are high, you are over 50, or you are at high risk for heart disease, you may need your cholesterol levels checked more frequently.Ongoing high lipid and cholesterol levels should be treated with medicines if diet and exercise are not working.  If you smoke, find out from your health care provider how to quit. If you do not use tobacco, do not start.  Lung cancer screening is recommended for adults aged 56-80 years who are at high risk for developing lung cancer because of a history of smoking. A yearly low-dose CT scan of the lungs is recommended for people who have at least a 30-pack-year history of smoking and are a current smoker or  have quit within the past 15 years. A pack year of smoking is smoking an average of 1 pack of cigarettes a day for 1 year (for example: 1 pack a day for 30 years or 2 packs a day for 15 years). Yearly screening should continue until the smoker has stopped smoking for at least 15 years. Yearly screening should be stopped for people who develop a health problem that would prevent them from having lung cancer treatment.  Avoid use of street drugs. Do not share needles with anyone. Ask for help if you need support or instructions about stopping the use of drugs.  High blood pressure causes heart disease and increases the risk of stroke.  Ongoing high blood pressure should be treated with medicines if weight loss and exercise do not work.  If you are 52-67 years old, ask your health care provider if you should take aspirin to prevent strokes.  Diabetes screening involves taking a blood sample to check your fasting blood sugar level. This should be done once every 3 years, after age 9, if you are within normal weight and without risk factors for  diabetes. Testing should be considered at a younger age or be carried out more frequently if you are overweight and have at least 1 risk factor for diabetes.  Breast cancer screening is essential preventive care for women. You should practice "breast self-awareness." This means understanding the normal appearance and feel of your breasts and may include breast self-examination. Any changes detected, no matter how small, should be reported to a health care provider. Women in their 45s and 30s should have a clinical breast exam (CBE) by a health care provider as part of a regular health exam every 1 to 3 years. After age 76, women should have a CBE every year. Starting at age 54, women should consider having a mammogram (breast X-ray test) every year. Women who have a family history of breast cancer should talk to their health care provider about genetic screening. Women at a high risk of breast cancer should talk to their health care providers about having an MRI and a mammogram every year.  Breast cancer gene (BRCA)-related cancer risk assessment is recommended for women who have family members with BRCA-related cancers. BRCA-related cancers include breast, ovarian, tubal, and peritoneal cancers. Having family members with these cancers may be associated with an increased risk for harmful changes (mutations) in the breast cancer genes BRCA1 and BRCA2. Results of the assessment will determine the need for genetic counseling and BRCA1 and BRCA2 testing.  Routine pelvic exams to screen for cancer are no longer recommended for nonpregnant women who are considered low risk for cancer of the pelvic organs (ovaries, uterus, and vagina) and who do not have symptoms. Ask your health care provider if a screening pelvic exam is right for you.  If you have had past treatment for cervical cancer or a condition that could lead to cancer, you need Pap tests and screening for cancer for at least 20 years after your  treatment. If Pap tests have been discontinued, your risk factors (such as having a new sexual partner) need to be reassessed to determine if screening should be resumed. Some women have medical problems that increase the chance of getting cervical cancer. In these cases, your health care provider may recommend more frequent screening and Pap tests.    Colorectal cancer can be detected and often prevented. Most routine colorectal cancer screening begins at the age of 73 years and  continues through age 75 years. However, your health care provider may recommend screening at an earlier age if you have risk factors for colon cancer. On a yearly basis, your health care provider may provide home test kits to check for hidden blood in the stool. Use of a small camera at the end of a tube, to directly examine the colon (sigmoidoscopy or colonoscopy), can detect the earliest forms of colorectal cancer. Talk to your health care provider about this at age 50, when routine screening begins. Direct exam of the colon should be repeated every 5-10 years through age 75 years, unless early forms of pre-cancerous polyps or small growths are found.  Osteoporosis is a disease in which the bones lose minerals and strength with aging. This can result in serious bone fractures or breaks. The risk of osteoporosis can be identified using a bone density scan. Women ages 65 years and over and women at risk for fractures or osteoporosis should discuss screening with their health care providers. Ask your health care provider whether you should take a calcium supplement or vitamin D to reduce the rate of osteoporosis.  Menopause can be associated with physical symptoms and risks. Hormone replacement therapy is available to decrease symptoms and risks. You should talk to your health care provider about whether hormone replacement therapy is right for you.  Use sunscreen. Apply sunscreen liberally and repeatedly throughout the day. You  should seek shade when your shadow is shorter than you. Protect yourself by wearing long sleeves, pants, a wide-brimmed hat, and sunglasses year round, whenever you are outdoors.  Once a month, do a whole body skin exam, using a mirror to look at the skin on your back. Tell your health care provider of new moles, moles that have irregular borders, moles that are larger than a pencil eraser, or moles that have changed in shape or color.  Stay current with required vaccines (immunizations).  Influenza vaccine. All adults should be immunized every year.  Tetanus, diphtheria, and acellular pertussis (Td, Tdap) vaccine. Pregnant women should receive 1 dose of Tdap vaccine during each pregnancy. The dose should be obtained regardless of the length of time since the last dose. Immunization is preferred during the 27th-36th week of gestation. An adult who has not previously received Tdap or who does not know her vaccine status should receive 1 dose of Tdap. This initial dose should be followed by tetanus and diphtheria toxoids (Td) booster doses every 10 years. Adults with an unknown or incomplete history of completing a 3-dose immunization series with Td-containing vaccines should begin or complete a primary immunization series including a Tdap dose. Adults should receive a Td booster every 10 years.    Zoster vaccine. One dose is recommended for adults aged 60 years or older unless certain conditions are present.    Pneumococcal 13-valent conjugate (PCV13) vaccine. When indicated, a person who is uncertain of her immunization history and has no record of immunization should receive the PCV13 vaccine. An adult aged 19 years or older who has certain medical conditions and has not been previously immunized should receive 1 dose of PCV13 vaccine. This PCV13 should be followed with a dose of pneumococcal polysaccharide (PPSV23) vaccine. The PPSV23 vaccine dose should be obtained at least 8 weeks after the dose  of PCV13 vaccine. An adult aged 19 years or older who has certain medical conditions and previously received 1 or more doses of PPSV23 vaccine should receive 1 dose of PCV13. The PCV13 vaccine dose should   be obtained 1 or more years after the last PPSV23 vaccine dose.    Pneumococcal polysaccharide (PPSV23) vaccine. When PCV13 is also indicated, PCV13 should be obtained first. All adults aged 65 years and older should be immunized. An adult younger than age 65 years who has certain medical conditions should be immunized. Any person who resides in a nursing home or long-term care facility should be immunized. An adult smoker should be immunized. People with an immunocompromised condition and certain other conditions should receive both PCV13 and PPSV23 vaccines. People with human immunodeficiency virus (HIV) infection should be immunized as soon as possible after diagnosis. Immunization during chemotherapy or radiation therapy should be avoided. Routine use of PPSV23 vaccine is not recommended for American Indians, [place] Natives, or people younger than 65 years unless there are medical conditions that require PPSV23 vaccine. When indicated, people who have unknown immunization and have no record of immunization should receive PPSV23 vaccine. One-time revaccination 5 years after the first dose of PPSV23 is recommended for people aged 19-64 years who have chronic kidney failure, nephrotic syndrome, asplenia, or immunocompromised conditions. People who received 1-2 doses of PPSV23 before age 65 years should receive another dose of PPSV23 vaccine at age 65 years or later if at least 5 years have passed since the previous dose. Doses of PPSV23 are not needed for people immunized with PPSV23 at or after age 65 years.   Preventive Services / Frequency  Ages 65 years and over  Blood pressure check.  Lipid and cholesterol check.  Lung cancer screening. / Every year if you are aged 55-80 years and have a  30-pack-year history of smoking and currently smoke or have quit within the past 15 years. Yearly screening is stopped once you have quit smoking for at least 15 years or develop a health problem that would prevent you from having lung cancer treatment.  Clinical breast exam.** / Every year after age 40 years.  BRCA-related cancer risk assessment.** / For women who have family members with a BRCA-related cancer (breast, ovarian, tubal, or peritoneal cancers).  Mammogram.** / Every year beginning at age 40 years and continuing for as long as you are in good health. Consult with your health care provider.  Pap test.** / Every 3 years starting at age 30 years through age 65 or 70 years with 3 consecutive normal Pap tests. Testing can be stopped between 65 and 70 years with 3 consecutive normal Pap tests and no abnormal Pap or HPV tests in the past 10 years.  Fecal occult blood test (FOBT) of stool. / Every year beginning at age 50 years and continuing until age 75 years. You may not need to do this test if you get a colonoscopy every 10 years.  Flexible sigmoidoscopy or colonoscopy.** / Every 5 years for a flexible sigmoidoscopy or every 10 years for a colonoscopy beginning at age 50 years and continuing until age 75 years.  Hepatitis C blood test.** / For all people born from 1945 through 1965 and any individual with known risks for hepatitis C.  Osteoporosis screening.** / A one-time screening for women ages 65 years and over and women at risk for fractures or osteoporosis.  Skin self-exam. / Monthly.  Influenza vaccine. / Every year.  Tetanus, diphtheria, and acellular pertussis (Tdap/Td) vaccine.** / 1 dose of Td every 10 years.  Zoster vaccine.** / 1 dose for adults aged 60 years or older.  Pneumococcal 13-valent conjugate (PCV13) vaccine.** / Consult your health care provider.    Pneumococcal polysaccharide (PPSV23) vaccine.** / 1 dose for all adults aged 65 years and older. Screening  for abdominal aortic aneurysm (AAA)  by ultrasound is recommended for people who have history of high blood pressure or who are current or former smokers. 

## 2015-09-11 LAB — MICROALBUMIN / CREATININE URINE RATIO
Creatinine, Urine: 101 mg/dL (ref 20–320)
Microalb Creat Ratio: 89 mcg/mg creat — ABNORMAL HIGH (ref <30)
Microalb, Ur: 9 mg/dL — ABNORMAL HIGH

## 2015-09-11 LAB — VITAMIN D 25 HYDROXY (VIT D DEFICIENCY, FRACTURES): VIT D 25 HYDROXY: 60 ng/mL (ref 30–100)

## 2015-09-11 LAB — INSULIN, RANDOM: Insulin: 3 u[IU]/mL (ref 2.0–19.6)

## 2015-09-13 ENCOUNTER — Other Ambulatory Visit: Payer: Self-pay | Admitting: Internal Medicine

## 2015-09-13 DIAGNOSIS — Z1231 Encounter for screening mammogram for malignant neoplasm of breast: Secondary | ICD-10-CM

## 2015-10-15 ENCOUNTER — Other Ambulatory Visit: Payer: Self-pay | Admitting: Internal Medicine

## 2015-10-15 ENCOUNTER — Ambulatory Visit
Admission: RE | Admit: 2015-10-15 | Discharge: 2015-10-15 | Disposition: A | Discharge status: Home / Self Care | Payer: Medicare Other | Source: Ambulatory Visit | Attending: Internal Medicine | Admitting: Internal Medicine

## 2015-10-15 ENCOUNTER — Other Ambulatory Visit: Payer: Self-pay | Admitting: *Deleted

## 2015-10-15 DIAGNOSIS — Z1212 Encounter for screening for malignant neoplasm of rectum: Secondary | ICD-10-CM

## 2015-10-15 DIAGNOSIS — Z1231 Encounter for screening mammogram for malignant neoplasm of breast: Secondary | ICD-10-CM

## 2015-10-15 DIAGNOSIS — M810 Age-related osteoporosis without current pathological fracture: Secondary | ICD-10-CM

## 2015-10-15 DIAGNOSIS — E2839 Other primary ovarian failure: Secondary | ICD-10-CM

## 2015-10-15 LAB — POC HEMOCCULT BLD/STL (HOME/3-CARD/SCREEN)
Card #2 Fecal Occult Blod, POC: NEGATIVE
Card #3 Fecal Occult Blood, POC: NEGATIVE
Fecal Occult Blood, POC: NEGATIVE

## 2015-10-15 MED ORDER — ALENDRONATE SODIUM 70 MG PO TABS: ORAL_TABLET | ORAL | Status: DC

## 2015-10-16 ENCOUNTER — Other Ambulatory Visit: Payer: Self-pay | Admitting: *Deleted

## 2016-01-02 ENCOUNTER — Encounter: Payer: Self-pay | Admitting: Physician Assistant

## 2016-01-02 ENCOUNTER — Ambulatory Visit: Payer: PPO

## 2016-01-02 ENCOUNTER — Ambulatory Visit (INDEPENDENT_AMBULATORY_CARE_PROVIDER_SITE_OTHER): Payer: Medicare Other | Admitting: Physician Assistant

## 2016-01-02 VITALS — BP 130/70 | HR 66 | Temp 97.5°F | Resp 14 | Ht 62.0 in

## 2016-01-02 DIAGNOSIS — E559 Vitamin D deficiency, unspecified: Secondary | ICD-10-CM

## 2016-01-02 DIAGNOSIS — Z23 Encounter for immunization: Secondary | ICD-10-CM

## 2016-01-02 DIAGNOSIS — R7309 Other abnormal glucose: Secondary | ICD-10-CM | POA: Diagnosis not present

## 2016-01-02 DIAGNOSIS — N183 Chronic kidney disease, stage 3 unspecified: Secondary | ICD-10-CM

## 2016-01-02 DIAGNOSIS — I10 Essential (primary) hypertension: Secondary | ICD-10-CM

## 2016-01-02 DIAGNOSIS — E785 Hyperlipidemia, unspecified: Secondary | ICD-10-CM | POA: Diagnosis not present

## 2016-01-02 DIAGNOSIS — Z79899 Other long term (current) drug therapy: Secondary | ICD-10-CM | POA: Diagnosis not present

## 2016-01-02 DIAGNOSIS — N393 Stress incontinence (female) (male): Secondary | ICD-10-CM

## 2016-01-02 LAB — CBC WITH DIFFERENTIAL/PLATELET
Basophils Absolute: 0 cells/uL (ref 0–200)
Basophils Relative: 0 %
EOS PCT: 1 %
Eosinophils Absolute: 48 cells/uL (ref 15–500)
HCT: 36.3 % (ref 35.0–45.0)
HEMOGLOBIN: 11.5 g/dL — AB (ref 11.7–15.5)
LYMPHS ABS: 1392 {cells}/uL (ref 850–3900)
Lymphocytes Relative: 29 %
MCH: 27 pg (ref 27.0–33.0)
MCHC: 31.7 g/dL — AB (ref 32.0–36.0)
MCV: 85.2 fL (ref 80.0–100.0)
MPV: 11.2 fL (ref 7.5–12.5)
Monocytes Absolute: 336 cells/uL (ref 200–950)
Monocytes Relative: 7 %
NEUTROS ABS: 3024 {cells}/uL (ref 1500–7800)
Neutrophils Relative %: 63 %
Platelets: 200 10*3/uL (ref 140–400)
RBC: 4.26 MIL/uL (ref 3.80–5.10)
RDW: 15.1 % — ABNORMAL HIGH (ref 11.0–15.0)
WBC: 4.8 10*3/uL (ref 3.8–10.8)

## 2016-01-02 LAB — BASIC METABOLIC PANEL WITH GFR
BUN: 22 mg/dL (ref 7–25)
CALCIUM: 10 mg/dL (ref 8.6–10.4)
CHLORIDE: 103 mmol/L (ref 98–110)
CO2: 29 mmol/L (ref 20–31)
Creat: 1.05 mg/dL — ABNORMAL HIGH (ref 0.60–0.88)
GFR, EST NON AFRICAN AMERICAN: 49 mL/min — AB (ref >=60)
GFR, Est African American: 57 mL/min — ABNORMAL LOW (ref >=60)
Glucose, Bld: 102 mg/dL — ABNORMAL HIGH (ref 65–99)
POTASSIUM: 5.5 mmol/L — AB (ref 3.5–5.3)
SODIUM: 140 mmol/L (ref 135–146)

## 2016-01-02 LAB — LIPID PANEL
Cholesterol: 195 mg/dL (ref 125–200)
HDL: 69 mg/dL (ref >=46)
LDL CALC: 103 mg/dL (ref <130)
TRIGLYCERIDES: 117 mg/dL (ref <150)
Total CHOL/HDL Ratio: 2.8 Ratio (ref <=5.0)
VLDL: 23 mg/dL (ref <30)

## 2016-01-02 LAB — HEPATIC FUNCTION PANEL
ALT: 11 U/L (ref 6–29)
AST: 23 U/L (ref 10–35)
Albumin: 4.6 g/dL (ref 3.6–5.1)
Alkaline Phosphatase: 68 U/L (ref 33–130)
BILIRUBIN DIRECT: 0.1 mg/dL (ref <=0.2)
BILIRUBIN INDIRECT: 0.3 mg/dL (ref 0.2–1.2)
Total Bilirubin: 0.4 mg/dL (ref 0.2–1.2)
Total Protein: 7.1 g/dL (ref 6.1–8.1)

## 2016-01-02 LAB — TSH: TSH: 1.93 mIU/L

## 2016-01-02 NOTE — Patient Instructions (Signed)
Urinary Incontinence Urinary incontinence is the involuntary loss of urine from your bladder. CAUSES  There are many causes of urinary incontinence. They include:  Medicines.  Infections.  Prostatic enlargement, leading to overflow of urine from your bladder.  Surgery.  Neurological diseases.  Emotional factors. SIGNS AND SYMPTOMS Urinary Incontinence can be divided into four types: 1. Urge incontinence. Urge incontinence is the involuntary loss of urine before you have the opportunity to go to the bathroom. There is a sudden urge to void but not enough time to reach a bathroom. 2. Stress incontinence. Stress incontinence is the sudden loss of urine with any activity that forces urine to pass. It is commonly caused by anatomical changes to the pelvis and sphincter areas of your body. 3. Overflow incontinence. Overflow incontinence is the loss of urine from an obstructed opening to your bladder. This results in a backup of urine and a resultant buildup of pressure within the bladder. When the pressure within the bladder exceeds the closing pressure of the sphincter, the urine overflows, which causes incontinence, similar to water overflowing a dam. 4. Total incontinence. Total incontinence is the loss of urine as a result of the inability to store urine within your bladder. DIAGNOSIS  Evaluating the cause of incontinence may require:  A thorough and complete medical and obstetric history.  A complete physical exam.  Laboratory tests such as a urine culture and sensitivities. When additional tests are indicated, they can include:  An ultrasound exam.  Kidney and bladder X-rays.  Cystoscopy. This is an exam of the bladder using a narrow scope.  Urodynamic testing to test the nerve function to the bladder and sphincter areas. TREATMENT  Treatment for urinary incontinence depends on the cause:  For urge incontinence caused by a bacterial infection, antibiotics will be prescribed.  If the urge incontinence is related to medicines you take, your health care provider may have you change the medicine.  For stress incontinence, surgery to re-establish anatomical support to the bladder or sphincter, or both, will often correct the condition.  For overflow incontinence caused by an enlarged prostate, an operation to open the channel through the enlarged prostate will allow the flow of urine out of the bladder. In women with fibroids, a hysterectomy may be recommended.  For total incontinence, surgery on your urinary sphincter may help. An artificial urinary sphincter (an inflatable cuff placed around the urethra) may be required. In women who have developed a hole-like passage between their bladder and vagina (vesicovaginal fistula), surgery to close the fistula often is required. HOME CARE INSTRUCTIONS  Normal daily hygiene and the use of pads or adult diapers that are changed regularly will help prevent odors and skin damage.  Avoid caffeine. It can overstimulate your bladder.  Use the bathroom regularly. Try about every 2-3 hours to go to the bathroom, even if you do not feel the need to do so. Take time to empty your bladder completely. After urinating, wait a minute. Then try to urinate again.  For causes involving nerve dysfunction, keep a log of the medicines you take and a journal of the times you go to the bathroom. SEEK MEDICAL CARE IF:  You experience worsening of pain instead of improvement in pain after your procedure.  Your incontinence becomes worse instead of better. SEE IMMEDIATE MEDICAL CARE IF:  You experience fever or shaking chills.  You are unable to pass your urine.  You have redness spreading into your groin or down into your thighs. MAKE SURE   YOU:   Understand these instructions.   Will watch your condition.  Will get help right away if you are not doing well or get worse.   This information is not intended to replace advice given to you  by your health care provider. Make sure you discuss any questions you have with your health care provider.   Document Released: 04/24/2004 Document Revised: 04/07/2014 Document Reviewed: 08/24/2012 Elsevier Interactive Patient Education 2016 [employer]  Burch Procedure for Stress Incontinence The Burch procedure is surgery to treat the loss of bladder control (urinary incontinence) in women. Women with urinary incontinence often have urine loss while straining, coughing, sneezing, or laughing. Urinary incontinence may occur because of the bladder and urethra changing position through the course of normal aging or childbirth.  The goal of the Burch procedure is to improve bladder function by providing more support for the urethra and bladder and by restoring them to their normal position.  LET [facility] PROVIDER KNOW ABOUT:  Any allergies you have.  All medicines you are taking, including vitamins, herbs, eye drops, creams, and over-the-counter medicines.  Previous problems you or members of your family have had with the use of anesthetics.  Any blood disorders you have.  Previous surgeries you have had.  Medical conditions you have. RISKS AND COMPLICATIONS Generally, this is a safe procedure. However, as with any procedure, complications can occur. Possible complications include:  Bleeding.  Infection.  Injury to the bladder, urethra, or surrounding organs.  Problems related to the use of anesthetics.  You may have difficulty urinating or may have leaking of urine again. BEFORE THE PROCEDURE  Various tests may need to be done before the day of surgery, such as:   Blood tests.   X-rays.   Ultrasonography.   Bladder and urinating tests (urodynamic testing).   Cystoscopy. This is a test to look into your bladder using a small metal scope with a light.   Ask your health care provider about changing or stopping your regular medicines. You may need to stop  taking certain medicines, such as aspirin or blood thinners, at least a week before your surgery.   Do not eat or drink anything for 8 hours before your surgery.   If you smoke, do not smoke for at least 2 weeks before surgery. Smokers do not heal as well and tend to have more breathing problems during and after surgery.  Make plans to have someone drive you home after your hospital stay. Also arrange for someone to help you with activities during recovery.  PROCEDURE   You will be given a medicine that makes you go to sleep (general anesthetic) or a medicine injected into your spine that numbs your body below the waist (spinal anesthetic).  The surgeon may use either a laparoscopic or open technique for this surgery:  In the laparoscopic technique, the surgery is done through small cuts (incisions) in the abdomen and groin area. A thin, lighted tube with a tiny camera on the end (laparoscope) is inserted into one of the incisions. The tools needed for the procedure are put through the other incisions. This technique allows for a faster recovery time.  In the open technique, the surgery is done through one large incision in the abdomen.  Using either technique, the surgeon lifts the wall of the vagina where the urethra is located. The vaginal wall is stitched (sutured) to tissue near the pubic bone. This corrects the weakness so that the bladder remains stable  during activities that might cause leakage, such as coughing or sneezing.  The incision or incisions will be closed with stitches. AFTER THE PROCEDURE  You will be taken to a recovery area where your progress will be monitored. Once you are awake and stable, you will likely be moved to a regular hospital room.  You may have a thin, flexible tube (catheter) in your bladder to drain urine. This may stay in place until your bladder is working properly on its own. The catheter may be removed before you are discharged, or it may stay in  place when you go home.  You may have gauze or bandages (dressings) in the vagina. This will be removed in 1-2 days.  Depending on your condition, you may still have a catheter in place when you go home.  If the laparoscopic technique was used, you may be allowed to go home after several hours, or you may need to stay in the hospital overnight. If the open technique was used, you will likely need to stay in the hospital for a couple days.   This information is not intended to replace advice given to you by your health care provider. Make sure you discuss any questions you have with your health care provider.   Document Released: 06/07/2003 Document Revised: 11/17/2012 Document Reviewed: 09/17/2012 Elsevier Interactive Patient Education [employer]

## 2016-01-02 NOTE — Progress Notes (Signed)
Assessment and Plan:   Hypertension -Continue medication, monitor blood pressure at home. Continue DASH diet.  Reminder to go to the ER if any CP, SOB, nausea, dizziness, severe HA, changes vision/speech, left arm numbness and tingling and jaw pain.  Cholesterol -Continue diet and exercise. Check cholesterol.    Prediabetes  -Continue diet and exercise. Check A1C  Vitamin D Def - check level and continue medications.   Otitis effusion Allergy pill, sudafed, autoinflation.   Stress incontinence Declines meds at this time, if continues will get referral to urology, bladder matters given  Continue diet and meds as discussed. Further disposition pending results of labs. Over 30 minutes of exam, counseling, chart review, and critical decision making was performed  Future Appointments Date Time Provider Department Center  03/17/2016 8:45 AM Terri Piedra, PA-C [facility] None  10/09/2016 10:00 AM Lucky Cowboy, MD [facility] None    HPI 82 y.o. female  presents for 3 month follow up on hypertension, cholesterol, prediabetes, and vitamin D deficiency.   Her blood pressure has been controlled at home, today their BP is BP: 130/70  She does workout, will start walking again, walks in her neighborhood. She denies chest pain, shortness of breath, dizziness. Has had some popping, clicking in her ears.  She is a very good 82 year old, does not want to take a medication, will refer to urology for possible pelvic floor PT versus surgery.   She is on cholesterol medication and denies myalgias. Her cholesterol is at goal. The cholesterol last visit was:   Lab Results  Component Value Date   CHOL 187 09/10/2015   HDL 81 09/10/2015   LDLCALC 89 09/10/2015   TRIG 84 09/10/2015   CHOLHDL 2.3 09/10/2015    She has been working on diet and exercise for prediabetes, and denies paresthesia of the feet, polydipsia, polyuria and visual disturbances. Last A1C in the office was:  Lab Results   Component Value Date   HGBA1C 5.9 (H) 09/10/2015   Patient is on Vitamin D supplement.   Lab Results  Component Value Date   VD25OH 60 09/10/2015      Current Medications:  Current Outpatient Prescriptions on File Prior to Visit  Medication Sig Dispense Refill  . aspirin 81 MG tablet Take 81 mg by mouth daily.    . Biotin 2500 MCG CAPS Take by mouth daily.    . Cholecalciferol (VITAMIN D3) 2000 UNITS TABS Take by mouth. Taking 4000 units daily    . CINNAMON PO Take 2,000 mg by mouth daily.    . fish oil-omega-3 fatty acids 1000 MG capsule Take 2 g by mouth daily. 1400 mg    . simvastatin (ZOCOR) 40 MG tablet Take 1 tablet (40 mg total) by mouth at bedtime. 90 tablet 1   No current facility-administered medications on file prior to visit.    Medical History:  Past Medical History:  Diagnosis Date  . Anemia of chronic renal failure   . Chronic renal insufficiency   . Degenerative joint disease   . HTN (hypertension)   . Hyperlipidemia   . Shingles   . Vitamin D deficiency    Allergies:  Allergies  Allergen Reactions  . Ace Inhibitors     Hyperkalemia     Review of Systems:  Review of Systems  Constitutional: Negative for chills, fever and malaise/fatigue.  HENT: Positive for congestion and ear pain. Negative for sore throat.   Eyes: Negative.   Respiratory: Negative for cough, shortness of breath and  wheezing.   Cardiovascular: Negative for chest pain, palpitations and leg swelling.  Gastrointestinal: Negative for blood in stool, constipation, diarrhea, heartburn and melena.  Genitourinary: Negative.        + stress incontinence with cough, sneezing, picking things up.   Skin: Negative.   Neurological: Negative for dizziness, sensory change, loss of consciousness and headaches.  Psychiatric/Behavioral: Negative for depression. The patient is not nervous/anxious and does not have insomnia.     Family history- Review and unchanged Social history- Review and  unchanged Physical Exam: BP 130/70   Pulse 66   Temp 97.5 F (36.4 C)   Resp 14   Ht 5\' 2"  (1.575 m)   SpO2 99%  Wt Readings from Last 3 Encounters:  09/10/15 113 lb 3.2 oz (51.3 kg)  06/08/15 113 lb (51.3 kg)  03/09/15 110 lb 9.6 oz (50.2 kg)   General Appearance: Well nourished, in no apparent distress. Eyes: PERRLA, EOMs, conjunctiva no swelling or erythema Sinuses: No Frontal/maxillary tenderness ENT/Mouth: Ext aud canals clear, TMs without erythema, bulging. No erythema, swelling, or exudate on post pharynx.  Tonsils not swollen or erythematous. Hearing normal.  Neck: Supple, thyroid normal.  Respiratory: Respiratory effort normal, BS equal bilaterally without rales, rhonchi, wheezing or stridor.  Cardio: RRR with no MRGs. Brisk peripheral pulses without edema.  Abdomen: Soft, + BS,  Non tender, no guarding, rebound, hernias, masses. Lymphatics: Non tender without lymphadenopathy.  Musculoskeletal: Full ROM, 5/5 strength, Normal gait Skin: Warm, dry without rashes, lesions, ecchymosis.  Neuro: Cranial nerves intact. Normal muscle tone, no cerebellar symptoms. Psych: Awake and oriented X 3, normal affect, Insight and Judgment appropriate.    Ann MullingAmanda Tatiana Courter, PA-C 10:02 AM [facility]

## 2016-01-03 LAB — URINALYSIS, ROUTINE W REFLEX MICROSCOPIC
BILIRUBIN URINE: NEGATIVE
Glucose, UA: NEGATIVE
Hgb urine dipstick: NEGATIVE
Ketones, ur: NEGATIVE
Leukocytes, UA: NEGATIVE
Nitrite: NEGATIVE
SPECIFIC GRAVITY, URINE: 1.023 (ref 1.001–1.035)
pH: 7 (ref 5.0–8.0)

## 2016-01-03 LAB — URINALYSIS, MICROSCOPIC ONLY
BACTERIA UA: NONE SEEN [HPF]
Casts: NONE SEEN [LPF]
Crystals: NONE SEEN [HPF]
RBC / HPF: NONE SEEN RBC/HPF (ref <=2)
Squamous Epithelial / LPF: NONE SEEN [HPF] (ref <=5)
WBC UA: NONE SEEN WBC/HPF (ref <=5)
YEAST: NONE SEEN [HPF]

## 2016-01-03 LAB — URINE CULTURE

## 2016-01-03 LAB — HEMOGLOBIN A1C
HEMOGLOBIN A1C: 5.7 % — AB (ref <5.7)
Mean Plasma Glucose: 117 mg/dL

## 2016-03-17 ENCOUNTER — Ambulatory Visit: Payer: Self-pay | Admitting: Internal Medicine

## 2016-04-02 ENCOUNTER — Other Ambulatory Visit: Payer: Self-pay | Admitting: *Deleted

## 2016-04-02 MED ORDER — SIMVASTATIN 40 MG PO TABS: 40.0000 mg | ORAL_TABLET | Freq: Every day | ORAL | 1 refills | Status: DC

## 2016-04-16 ENCOUNTER — Ambulatory Visit: Payer: Self-pay | Admitting: Internal Medicine

## 2016-04-30 ENCOUNTER — Ambulatory Visit (INDEPENDENT_AMBULATORY_CARE_PROVIDER_SITE_OTHER): Payer: PPO | Admitting: Internal Medicine

## 2016-04-30 ENCOUNTER — Encounter: Payer: Self-pay | Admitting: Internal Medicine

## 2016-04-30 VITALS — BP 144/68 | HR 60 | Temp 97.6°F | Resp 16 | Ht 62.0 in | Wt 115.8 lb

## 2016-04-30 DIAGNOSIS — N183 Chronic kidney disease, stage 3 unspecified: Secondary | ICD-10-CM

## 2016-04-30 DIAGNOSIS — E785 Hyperlipidemia, unspecified: Secondary | ICD-10-CM | POA: Diagnosis not present

## 2016-04-30 DIAGNOSIS — R7303 Prediabetes: Secondary | ICD-10-CM | POA: Diagnosis not present

## 2016-04-30 DIAGNOSIS — K5900 Constipation, unspecified: Secondary | ICD-10-CM

## 2016-04-30 DIAGNOSIS — E559 Vitamin D deficiency, unspecified: Secondary | ICD-10-CM

## 2016-04-30 DIAGNOSIS — I10 Essential (primary) hypertension: Secondary | ICD-10-CM

## 2016-04-30 NOTE — Progress Notes (Signed)
Assessment and Plan:  Hypertension:  -Not on medications -monitor blood pressure at home.  -Continue DASH diet.   -Reminder to go to the ER if any CP, SOB, nausea, dizziness, severe HA, changes vision/speech, left arm numbness and tingling, and jaw pain.  Cholesterol: -Continue diet and exercise.  -stop zocor due to age   Pre-diabetes: -Continue diet and exercise.   Vitamin D Def: -continue medications.   Constipation -increase activity -increase water intake -daily fiber supplement -try linzess samples 72 mcg daily with breakfast  Now only on supplements recommended extending visits to once every six months.  Patient will call us if necessary.    Continue diet and meds as discussed. Further disposition pending results of labs.  HPI 83 y.o. female  presents for 3 month follow up with hypertension, hyperlipidemia, prediabetes and vitamin D.   Her blood pressure has been controlled at home, today their BP is BP: (!) 144/68.   She does workout. She denies chest pain, shortness of breath, dizziness.   She is on cholesterol medication and denies myalgias. Her cholesterol is at goal. The cholesterol last visit was:   Lab Results  Component Value Date   CHOL 195 01/02/2016   HDL 69 01/02/2016   LDLCALC 103 01/02/2016   TRIG 117 01/02/2016   CHOLHDL 2.8 01/02/2016  She would like to know if she can stop her cholesterol medication.  She has no history of CAD or CVA.     She has been working on diet and exercise for prediabetes, and denies foot ulcerations, hyperglycemia, hypoglycemia , increased appetite, nausea, paresthesia of the feet, polydipsia, polyuria, visual disturbances, vomiting and weight loss. Last A1C in the office was:  Lab Results  Component Value Date   HGBA1C 5.7 (H) 01/02/2016    Patient is on Vitamin D supplement.  Lab Results  Component Value Date   VD25OH 60 09/10/2015      She does have some constipation.  She reports that she has tried some miralax  and also dulcolax over the counter without great relief.   Current Medications:  Current Outpatient Prescriptions on File Prior to Visit  Medication Sig Dispense Refill  . aspirin 81 MG tablet Take 81 mg by mouth daily.    . Biotin 2500 MCG CAPS Take by mouth daily.    . Cholecalciferol (VITAMIN D3) 2000 UNITS TABS Take by mouth. Taking 4000 units daily    . CINNAMON PO Take 2,000 mg by mouth daily.    . fish oil-omega-3 fatty acids 1000 MG capsule Take 2 g by mouth daily. 1400 mg    . simvastatin (ZOCOR) 40 MG tablet Take 1 tablet (40 mg total) by mouth at bedtime. 90 tablet 1   No current facility-administered medications on file prior to visit.     Medical History:  Past Medical History:  Diagnosis Date  . Anemia of chronic renal failure   . Chronic renal insufficiency   . Degenerative joint disease   . HTN (hypertension)   . Hyperlipidemia   . Shingles   . Vitamin D deficiency     Allergies:  Allergies  Allergen Reactions  . Ace Inhibitors     Hyperkalemia      Review of Systems:  ROS  Family history- Review and unchanged  Social history- Review and unchanged  Physical Exam: BP (!) 144/68   Pulse 60   Temp 97.6 F (36.4 C)   Resp 16   Ht 5\' 2"  (1.575 m)   Wt 115  lb 12.8 oz (52.5 kg)   SpO2 96%   BMI 21.18 kg/m  Wt Readings from Last 3 Encounters:  04/30/16 115 lb 12.8 oz (52.5 kg)  09/10/15 113 lb 3.2 oz (51.3 kg)  06/08/15 113 lb (51.3 kg)    General Appearance: Well nourished well developed, in no apparent distress. Eyes: PERRLA, EOMs, conjunctiva no swelling or erythema ENT/Mouth: Ear canals normal without obstruction, swelling, erythma, discharge.  TMs normal bilaterally.  Oropharynx moist, clear, without exudate, or postoropharyngeal swelling. Neck: Supple, thyroid normal,no cervical adenopathy  Respiratory: Respiratory effort normal, Breath sounds clear A&P without rhonchi, wheeze, or rale.  No retractions, no accessory usage. Cardio: RRR with  no MRGs. Brisk peripheral pulses without edema.  Abdomen: Soft, + BS,  Non tender, no guarding, rebound, hernias, masses. Musculoskeletal: Full ROM, 5/5 strength, Normal gait Skin: Warm, dry without rashes, lesions, ecchymosis.  Neuro: Awake and oriented X 3, Cranial nerves intact. Normal muscle tone, no cerebellar symptoms. Psych: Normal affect, Insight and Judgment appropriate.    Terri Piedraourtney Forcucci, PA-C 9:20 AM [facility]

## 2016-04-30 NOTE — Patient Instructions (Signed)
Please take linzess once daily with breakfast if you feel that you get constipated.  Please stop your cholesterol.  Please use flonase, 2 sprays per nostril right before bedtime to help prevent fluid behind your ears.

## 2016-10-09 ENCOUNTER — Ambulatory Visit (INDEPENDENT_AMBULATORY_CARE_PROVIDER_SITE_OTHER): Payer: PPO | Admitting: Internal Medicine

## 2016-10-09 VITALS — BP 138/84 | HR 68 | Temp 97.3°F | Resp 16 | Ht 62.0 in | Wt 114.8 lb

## 2016-10-09 DIAGNOSIS — Z136 Encounter for screening for cardiovascular disorders: Secondary | ICD-10-CM | POA: Diagnosis not present

## 2016-10-09 DIAGNOSIS — N183 Chronic kidney disease, stage 3 unspecified: Secondary | ICD-10-CM

## 2016-10-09 DIAGNOSIS — Z1212 Encounter for screening for malignant neoplasm of rectum: Secondary | ICD-10-CM

## 2016-10-09 DIAGNOSIS — E559 Vitamin D deficiency, unspecified: Secondary | ICD-10-CM

## 2016-10-09 DIAGNOSIS — E782 Mixed hyperlipidemia: Secondary | ICD-10-CM | POA: Diagnosis not present

## 2016-10-09 DIAGNOSIS — R7303 Prediabetes: Secondary | ICD-10-CM | POA: Diagnosis not present

## 2016-10-09 DIAGNOSIS — Z79899 Other long term (current) drug therapy: Secondary | ICD-10-CM | POA: Diagnosis not present

## 2016-10-09 DIAGNOSIS — Z0001 Encounter for general adult medical examination with abnormal findings: Secondary | ICD-10-CM

## 2016-10-09 DIAGNOSIS — I10 Essential (primary) hypertension: Secondary | ICD-10-CM | POA: Diagnosis not present

## 2016-10-09 DIAGNOSIS — R7309 Other abnormal glucose: Secondary | ICD-10-CM

## 2016-10-09 DIAGNOSIS — Z0000 Encounter for general adult medical examination without abnormal findings: Secondary | ICD-10-CM

## 2016-10-09 LAB — BASIC METABOLIC PANEL WITH GFR
BUN: 19 mg/dL (ref 7–25)
CHLORIDE: 102 mmol/L (ref 98–110)
CO2: 26 mmol/L (ref 20–31)
Calcium: 9.9 mg/dL (ref 8.6–10.4)
Creat: 1.07 mg/dL — ABNORMAL HIGH (ref 0.60–0.88)
GFR, EST NON AFRICAN AMERICAN: 48 mL/min — AB (ref >=60)
GFR, Est African American: 55 mL/min — ABNORMAL LOW (ref >=60)
GLUCOSE: 103 mg/dL — AB (ref 65–99)
POTASSIUM: 4.9 mmol/L (ref 3.5–5.3)
Sodium: 139 mmol/L (ref 135–146)

## 2016-10-09 LAB — LIPID PANEL
Cholesterol: 357 mg/dL — ABNORMAL HIGH (ref <200)
HDL: 69 mg/dL (ref >50)
LDL CALC: 264 mg/dL — AB (ref <100)
TRIGLYCERIDES: 119 mg/dL (ref <150)
Total CHOL/HDL Ratio: 5.2 Ratio — ABNORMAL HIGH (ref <5.0)
VLDL: 24 mg/dL (ref <30)

## 2016-10-09 LAB — CBC WITH DIFFERENTIAL/PLATELET
BASOS PCT: 1 %
Basophils Absolute: 44 cells/uL (ref 0–200)
EOS ABS: 44 {cells}/uL (ref 15–500)
Eosinophils Relative: 1 %
HEMATOCRIT: 38.9 % (ref 35.0–45.0)
Hemoglobin: 12.3 g/dL (ref 11.7–15.5)
Lymphocytes Relative: 35 %
Lymphs Abs: 1540 cells/uL (ref 850–3900)
MCH: 27.4 pg (ref 27.0–33.0)
MCHC: 31.6 g/dL — ABNORMAL LOW (ref 32.0–36.0)
MCV: 86.6 fL (ref 80.0–100.0)
MONO ABS: 264 {cells}/uL (ref 200–950)
MPV: 11.3 fL (ref 7.5–12.5)
Monocytes Relative: 6 %
NEUTROS ABS: 2508 {cells}/uL (ref 1500–7800)
Neutrophils Relative %: 57 %
PLATELETS: 205 10*3/uL (ref 140–400)
RBC: 4.49 MIL/uL (ref 3.80–5.10)
RDW: 15.2 % — ABNORMAL HIGH (ref 11.0–15.0)
WBC: 4.4 10*3/uL (ref 3.8–10.8)

## 2016-10-09 LAB — HEPATIC FUNCTION PANEL
ALBUMIN: 4.5 g/dL (ref 3.6–5.1)
ALK PHOS: 75 U/L (ref 33–130)
ALT: 9 U/L (ref 6–29)
AST: 18 U/L (ref 10–35)
BILIRUBIN INDIRECT: 0.5 mg/dL (ref 0.2–1.2)
BILIRUBIN TOTAL: 0.6 mg/dL (ref 0.2–1.2)
Bilirubin, Direct: 0.1 mg/dL (ref <=0.2)
Total Protein: 7 g/dL (ref 6.1–8.1)

## 2016-10-09 LAB — TSH: TSH: 1.69 m[IU]/L

## 2016-10-09 LAB — MAGNESIUM: Magnesium: 2.2 mg/dL (ref 1.5–2.5)

## 2016-10-09 NOTE — Patient Instructions (Signed)

## 2016-10-10 ENCOUNTER — Other Ambulatory Visit: Payer: Self-pay | Admitting: Internal Medicine

## 2016-10-10 LAB — HEMOGLOBIN A1C
Hgb A1c MFr Bld: 5.7 % — ABNORMAL HIGH (ref <5.7)
Mean Plasma Glucose: 117 mg/dL

## 2016-10-10 LAB — MICROALBUMIN / CREATININE URINE RATIO
Creatinine, Urine: 147 mg/dL (ref 20–320)
Microalb Creat Ratio: 151 mcg/mg creat — ABNORMAL HIGH (ref <30)
Microalb, Ur: 22.2 mg/dL

## 2016-10-10 LAB — INSULIN, RANDOM: INSULIN: 5.8 u[IU]/mL (ref 2.0–19.6)

## 2016-10-10 LAB — VITAMIN D 25 HYDROXY (VIT D DEFICIENCY, FRACTURES): Vit D, 25-Hydroxy: 65 ng/mL (ref 30–100)

## 2016-10-11 ENCOUNTER — Encounter: Payer: Self-pay | Admitting: Internal Medicine

## 2016-10-11 NOTE — Progress Notes (Signed)
[facility] Lucky Cowboy, M.D.      Dyanne Carrel. Steffanie Dunn, P.A.-C [facility]                [address]                [place], [place]. [address] Telephone [phone] Telefax [phone]  Annual Screening/Preventative Visit & Comprehensive Evaluation &  Examination     This very nice 83 y.o. [facility] presents for a Screening/Preventative Visit & comprehensive evaluation and management of multiple medical co-morbidities.  Patient has been followed for HTN, Prediabetes, Hyperlipidemia and Vitamin D Deficiency.      HTN predates since 1997. Patient's BP has been controlled at home and patient denies any cardiac symptoms as chest pain, palpitations, shortness of breath, dizziness or ankle swelling. Today's BP is at goal - 138/84.      Patient's hyperlipidemia was controlled with diet and medications. Patient denies myalgias or other medication SE's. Current lipids are not at goal after told to stop Simvastatin in Jan 2018 by Terri Piedra, PA-C.:  Lab Results  Component Value Date   CHOL 357 (H) 10/09/2016   HDL 69 10/09/2016   LDLCALC 264 (H) 10/09/2016   TRIG 119 10/09/2016   CHOLHDL 5.2 (H) 10/09/2016      Patient has prediabetes (A1c 6.1% in 2010 and 5.7% in 2016) and patient denies reactive hypoglycemic symptoms, visual blurring, diabetic polys, or paresthesias. current A1c is not at goal: Lab Results  Component Value Date   HGBA1C 5.7 (H) 10/09/2016      Finally, patient has history of Vitamin D Deficiency ("25" in 2008) and current Vitamin D was at goal Lab Results  Component Value Date   VD25OH 85 10/09/2016   Current Outpatient Prescriptions on File Prior to Visit  Medication Sig  . aspirin 81 MG tablet Take 81 mg by mouth daily.  . Biotin 2500 MCG CAPS Take by mouth daily.  . Cholecalciferol (VITAMIN D3) 2000 UNITS TABS Take by mouth. Taking 4000 units daily  . CINNAMON PO Take 2,000 mg by mouth  daily.  . fish oil-omega-3 fatty acids 1000 MG capsule Take 2 g by mouth daily. 1400 mg   No current facility-administered medications on file prior to visit.    Allergies  Allergen Reactions  . Ace Inhibitors     Hyperkalemia    Past Medical History:  Diagnosis Date  . Anemia of chronic renal failure   . Chronic renal insufficiency   . Degenerative joint disease   . HTN (hypertension)   . Hyperlipidemia   . Shingles   . Vitamin D deficiency    Health Maintenance  Topic Date Due  . TETANUS/TDAP  10/09/2017 (Originally 12/06/2015)  . INFLUENZA VACCINE  10/29/2016  . DEXA SCAN  Completed  . PNA vac Low Risk Adult  Completed   Immunization History  Administered Date(s) Administered  . Influenza Split 01/17/2013  . Influenza, High Dose Seasonal PF 01/02/2014, 01/16/2015, 01/02/2016  . Pneumococcal Conjugate-13 06/08/2015  . Pneumococcal Polysaccharide-23 06/04/2010  . Pneumococcal-Unspecified 01/31/1999  . Td 04/01/1995, 12/05/2005   No past surgical history on file. Family History  Problem Relation Age of Onset  . CVA Father   . Cancer Daughter        breast   Social History  Substance Use Topics  . Smoking status: Never Smoker  . Smokeless tobacco: Never Used  . Alcohol use 0.6 oz/week    1 Glasses of  wine per week     Comment: occasional wine    ROS Constitutional: Denies fever, chills, weight loss/gain, headaches, insomnia,  night sweats, and change in appetite. Does c/o fatigue. Eyes: Denies redness, blurred vision, diplopia, discharge, itchy, watery eyes.  ENT: Denies discharge, congestion, post nasal drip, epistaxis, sore throat, earache, hearing loss, dental pain, Tinnitus, Vertigo, Sinus pain, snoring.  Cardio: Denies chest pain, palpitations, irregular heartbeat, syncope, dyspnea, diaphoresis, orthopnea, PND, claudication, edema Respiratory: denies cough, dyspnea, DOE, pleurisy, hoarseness, laryngitis, wheezing.  Gastrointestinal: Denies dysphagia,  heartburn, reflux, water brash, pain, cramps, nausea, vomiting, bloating, diarrhea, constipation, hematemesis, melena, hematochezia, jaundice, hemorrhoids Genitourinary: Denies dysuria, frequency, urgency, nocturia, hesitancy, discharge, hematuria, flank pain Breast: Breast lumps, nipple discharge, bleeding.  Musculoskeletal: Denies arthralgia, myalgia, stiffness, Jt. Swelling, pain, limp, and strain/sprain. Denies falls. Skin: Denies puritis, rash, hives, warts, acne, eczema, changing in skin lesion Neuro: No weakness, tremor, incoordination, spasms, paresthesia, pain Psychiatric: Denies confusion, memory loss, sensory loss. Denies Depression. Endocrine: Denies change in weight, skin, hair change, nocturia, and paresthesia, diabetic polys, visual blurring, hyper / hypo glycemic episodes.  Heme/Lymph: No excessive bleeding, bruising, enlarged lymph nodes.  Physical Exam  BP 138/84   Pulse 68   Temp (!) 97.3 F (36.3 C)   Resp 16   Ht 5\' 2"  (1.575 m)   Wt 114 lb 12.8 oz (52.1 kg)   BMI 21.00 kg/m   General Appearance: Well nourished, well groomed and in no apparent distress.  Eyes: PERRLA, EOMs, conjunctiva no swelling or erythema, normal fundi and vessels. Sinuses: No frontal/maxillary tenderness ENT/Mouth: EACs patent / TMs  nl. Nares clear without erythema, swelling, mucoid exudates. Oral hygiene is good. No erythema, swelling, or exudate. Tongue normal, non-obstructing. Tonsils not swollen or erythematous. Hearing normal.  Neck: Supple, thyroid normal. No bruits, nodes or JVD. Respiratory: Respiratory effort normal.  BS equal and clear bilateral without rales, rhonci, wheezing or stridor. Cardio: Heart sounds are normal with regular rate and rhythm and no murmurs, rubs or gallops. Peripheral pulses are normal and equal bilaterally without edema. No aortic or femoral bruits. Chest: symmetric with normal excursions and percussion. Breasts: Symmetric, without lumps, nipple discharge,  retractions, or fibrocystic changes.  Abdomen: Flat, soft with bowel sounds active. Nontender, no guarding, rebound, hernias, masses, or organomegaly.  Lymphatics: Non tender without lymphadenopathy.  Musculoskeletal: Full ROM all peripheral extremities, joint stability, 5/5 strength, and normal gait. Skin: Warm and dry without rashes, lesions, cyanosis, clubbing or  ecchymosis.  Neuro: Cranial nerves intact, reflexes equal bilaterally. Normal muscle tone, no cerebellar symptoms. Sensation intact.  Pysch: Alert and oriented X 3, normal affect, Insight and Judgment appropriate.   Assessment and Plan  1. Annual Preventative Screening Examination  2. Essential hypertension  - EKG 12-Lead - Microalbumin / creatinine urine ratio - CBC with Differential/Platelet - BASIC METABOLIC PANEL WITH GFR - Magnesium - TSH  3. Hyperlipidemia, mixed  - EKG 12-Lead - Hepatic function panel - Lipid panel - TSH  4. Prediabetes  - EKG 12-Lead - Hemoglobin A1c - Insulin, random  5. Vitamin D deficiency  - VITAMIN D 25 Hydroxy   6. CKD Stage III  (GFR 49 ml/min)  - BASIC METABOLIC PANEL WITH GFR  7. Other abnormal glucose  - Hemoglobin A1c - Insulin, random  8. Screening for rectal cancer  - POC Hemoccult Bld/Stl   9. Screening for ischemic heart disease  - EKG 12-Lead  10. Medication management  - Microalbumin / creatinine urine ratio - CBC with  Differential/Platelet - BASIC METABOLIC PANEL WITH GFR - Hepatic function panel - Magnesium - Lipid panel - TSH - Hemoglobin A1c - Insulin, random - VITAMIN D 25 Hydroxy        Patient was counseled in prudent diet to achieve/maintain BMI less than 25 for weight control, BP monitoring, regular exercise and medications. Discussed med's effects and SE's. Screening labs and tests as requested with regular follow-up as recommended. Over 40 minutes of exam, counseling, chart review and high complex critical decision making was  performed.

## 2016-10-23 ENCOUNTER — Ambulatory Visit (INDEPENDENT_AMBULATORY_CARE_PROVIDER_SITE_OTHER): Payer: PPO

## 2016-10-23 DIAGNOSIS — Z23 Encounter for immunization: Secondary | ICD-10-CM | POA: Diagnosis not present

## 2016-10-23 NOTE — Progress Notes (Signed)
Pt present for TD vaccine given w/o issues.

## 2017-01-07 ENCOUNTER — Telehealth: Payer: Self-pay | Admitting: *Deleted

## 2017-01-07 NOTE — Telephone Encounter (Signed)
Patient called and complained of fluid in her ears, even though she is taking OTC Sudafed PE.  Per Dr Oneta Rack, she should try the Sudafed that you have to get from behind the pharmacy counter and sign for.  If this does help the fluid, she may need to see an ENT. Patient is aware.

## 2017-01-19 ENCOUNTER — Ambulatory Visit: Payer: PPO

## 2017-03-26 DIAGNOSIS — H35033 Hypertensive retinopathy, bilateral: Secondary | ICD-10-CM | POA: Diagnosis not present

## 2017-03-26 DIAGNOSIS — H353132 Nonexudative age-related macular degeneration, bilateral, intermediate dry stage: Secondary | ICD-10-CM | POA: Diagnosis not present

## 2017-03-26 DIAGNOSIS — H3563 Retinal hemorrhage, bilateral: Secondary | ICD-10-CM | POA: Diagnosis not present

## 2017-03-26 DIAGNOSIS — Z961 Presence of intraocular lens: Secondary | ICD-10-CM | POA: Diagnosis not present

## 2017-03-26 LAB — HM DIABETES EYE EXAM

## 2017-04-09 ENCOUNTER — Encounter: Payer: Self-pay | Admitting: *Deleted

## 2017-04-16 ENCOUNTER — Ambulatory Visit: Payer: Self-pay | Admitting: Physician Assistant

## 2017-04-17 ENCOUNTER — Emergency Department (HOSPITAL_COMMUNITY): Payer: PPO

## 2017-04-17 ENCOUNTER — Encounter (HOSPITAL_COMMUNITY): Payer: Self-pay | Admitting: Emergency Medicine

## 2017-04-17 ENCOUNTER — Emergency Department (HOSPITAL_COMMUNITY)
Admission: EM | Admit: 2017-04-17 | Discharge: 2017-04-17 | Disposition: A | Discharge status: Home / Self Care | Payer: PPO | Attending: Emergency Medicine | Admitting: Emergency Medicine

## 2017-04-17 ENCOUNTER — Encounter: Payer: Self-pay | Admitting: Physician Assistant

## 2017-04-17 ENCOUNTER — Ambulatory Visit (INDEPENDENT_AMBULATORY_CARE_PROVIDER_SITE_OTHER): Payer: PPO | Admitting: Physician Assistant

## 2017-04-17 VITALS — BP 220/100 | HR 76 | Temp 97.7°F | Resp 16 | Ht 62.0 in | Wt 114.2 lb

## 2017-04-17 DIAGNOSIS — I10 Essential (primary) hypertension: Secondary | ICD-10-CM

## 2017-04-17 DIAGNOSIS — I169 Hypertensive crisis, unspecified: Secondary | ICD-10-CM

## 2017-04-17 DIAGNOSIS — R7309 Other abnormal glucose: Secondary | ICD-10-CM | POA: Diagnosis not present

## 2017-04-17 DIAGNOSIS — Z79899 Other long term (current) drug therapy: Secondary | ICD-10-CM | POA: Insufficient documentation

## 2017-04-17 DIAGNOSIS — I129 Hypertensive chronic kidney disease with stage 1 through stage 4 chronic kidney disease, or unspecified chronic kidney disease: Secondary | ICD-10-CM | POA: Diagnosis not present

## 2017-04-17 DIAGNOSIS — Z0001 Encounter for general adult medical examination with abnormal findings: Secondary | ICD-10-CM | POA: Diagnosis not present

## 2017-04-17 DIAGNOSIS — Z7982 Long term (current) use of aspirin: Secondary | ICD-10-CM | POA: Diagnosis not present

## 2017-04-17 DIAGNOSIS — N183 Chronic kidney disease, stage 3 unspecified: Secondary | ICD-10-CM

## 2017-04-17 DIAGNOSIS — R6889 Other general symptoms and signs: Secondary | ICD-10-CM | POA: Diagnosis not present

## 2017-04-17 DIAGNOSIS — R42 Dizziness and giddiness: Secondary | ICD-10-CM | POA: Diagnosis not present

## 2017-04-17 DIAGNOSIS — E785 Hyperlipidemia, unspecified: Secondary | ICD-10-CM

## 2017-04-17 DIAGNOSIS — R202 Paresthesia of skin: Secondary | ICD-10-CM | POA: Diagnosis not present

## 2017-04-17 DIAGNOSIS — M810 Age-related osteoporosis without current pathological fracture: Secondary | ICD-10-CM

## 2017-04-17 DIAGNOSIS — R200 Anesthesia of skin: Secondary | ICD-10-CM

## 2017-04-17 DIAGNOSIS — E559 Vitamin D deficiency, unspecified: Secondary | ICD-10-CM

## 2017-04-17 DIAGNOSIS — Z0000 Encounter for general adult medical examination without abnormal findings: Secondary | ICD-10-CM

## 2017-04-17 DIAGNOSIS — M150 Primary generalized (osteo)arthritis: Secondary | ICD-10-CM

## 2017-04-17 DIAGNOSIS — R51 Headache: Secondary | ICD-10-CM | POA: Insufficient documentation

## 2017-04-17 DIAGNOSIS — R519 Headache, unspecified: Secondary | ICD-10-CM

## 2017-04-17 DIAGNOSIS — M159 Polyosteoarthritis, unspecified: Secondary | ICD-10-CM

## 2017-04-17 DIAGNOSIS — R030 Elevated blood-pressure reading, without diagnosis of hypertension: Secondary | ICD-10-CM | POA: Diagnosis not present

## 2017-04-17 LAB — CBC WITH DIFFERENTIAL/PLATELET
Basophils Absolute: 0 10*3/uL (ref 0.0–0.1)
Basophils Relative: 1 %
Eosinophils Absolute: 0 10*3/uL (ref 0.0–0.7)
Eosinophils Relative: 1 %
HCT: 36.2 % (ref 36.0–46.0)
HEMOGLOBIN: 11.6 g/dL — AB (ref 12.0–15.0)
LYMPHS ABS: 1 10*3/uL (ref 0.7–4.0)
LYMPHS PCT: 19 %
MCH: 27.4 pg (ref 26.0–34.0)
MCHC: 32 g/dL (ref 30.0–36.0)
MCV: 85.6 fL (ref 78.0–100.0)
Monocytes Absolute: 0.3 10*3/uL (ref 0.1–1.0)
Monocytes Relative: 5 %
NEUTROS ABS: 4.1 10*3/uL (ref 1.7–7.7)
NEUTROS PCT: 74 %
Platelets: 203 10*3/uL (ref 150–400)
RBC: 4.23 MIL/uL (ref 3.87–5.11)
RDW: 14.9 % (ref 11.5–15.5)
WBC: 5.4 10*3/uL (ref 4.0–10.5)

## 2017-04-17 LAB — COMPREHENSIVE METABOLIC PANEL
ALK PHOS: 71 U/L (ref 38–126)
ALT: 11 U/L — AB (ref 14–54)
AST: 22 U/L (ref 15–41)
Albumin: 4.1 g/dL (ref 3.5–5.0)
Anion gap: 12 (ref 5–15)
BUN: 19 mg/dL (ref 6–20)
CALCIUM: 9.7 mg/dL (ref 8.9–10.3)
CO2: 25 mmol/L (ref 22–32)
CREATININE: 1.05 mg/dL — AB (ref 0.44–1.00)
Chloride: 102 mmol/L (ref 101–111)
GFR, EST AFRICAN AMERICAN: 55 mL/min — AB (ref >60)
GFR, EST NON AFRICAN AMERICAN: 47 mL/min — AB (ref >60)
Glucose, Bld: 107 mg/dL — ABNORMAL HIGH (ref 65–99)
Potassium: 4.7 mmol/L (ref 3.5–5.1)
Sodium: 139 mmol/L (ref 135–145)
Total Bilirubin: 0.6 mg/dL (ref 0.3–1.2)
Total Protein: 6.9 g/dL (ref 6.5–8.1)

## 2017-04-17 MED ORDER — HYDRALAZINE HCL 10 MG PO TABS
10.0000 mg | ORAL_TABLET | Freq: Once | ORAL | Status: AC
  Administered 2017-04-17: 10 mg via ORAL
  Filled 2017-04-17: qty 1

## 2017-04-17 MED ORDER — HYDROCHLOROTHIAZIDE 12.5 MG PO TABS: 25.0000 mg | ORAL_TABLET | Freq: Every day | ORAL | 0 refills | Status: DC

## 2017-04-17 MED ORDER — AMLODIPINE BESYLATE 5 MG PO TABS
5.0000 mg | ORAL_TABLET | Freq: Once | ORAL | Status: AC
  Administered 2017-04-17: 5 mg via ORAL
  Filled 2017-04-17: qty 1

## 2017-04-17 MED ORDER — LORAZEPAM 1 MG PO TABS
0.5000 mg | ORAL_TABLET | Freq: Once | ORAL | Status: AC
  Administered 2017-04-17: 0.5 mg via ORAL
  Filled 2017-04-17: qty 1

## 2017-04-17 MED ORDER — AMLODIPINE BESYLATE 5 MG PO TABS: 5.0000 mg | ORAL_TABLET | Freq: Every day | ORAL | 0 refills | Status: DC

## 2017-04-17 NOTE — ED Notes (Signed)
Patient transported to MRI 

## 2017-04-17 NOTE — ED Provider Notes (Signed)
[facility] EMERGENCY DEPARTMENT Provider Note   CSN: [id number] Arrival date & time: 04/17/17  1120     History   Chief Complaint Chief Complaint  Patient presents with  . Hypertension    HPI Ann [name] is a 84 y.o. female.  HPI   Has had mild nagging headache, but this morning, after going to the bathroom started to feel numbness, felt like novacaine in lip, small portion of lower lip felt numb. Reports sensation lasted 5 minutes or less.    Took 3 baby aspirins and felt better.  No other neuro symptoms, no difficulty talking, walking, no other numbness or weakness nor change in vision.  Headaches have been mild, here and there, nagging, for a few weeks, didn't worsen today.  Checked blood pressure at home and it was 225 systolic  Not on blood pressure medications now, they took it off about 1.4yrs ago she is not sure why, also was taken off of cholesterol medication  No n//v/cough/urinary symptoms/chest pain nor dyspnea  Past Medical History:  Diagnosis Date  . Anemia of chronic renal failure   . Chronic renal insufficiency   . Degenerative joint disease   . HTN (hypertension)   . Hyperlipidemia   . Shingles   . Vitamin D deficiency     Patient Active Problem List   Diagnosis Date Noted  . Osteoporosis 10/15/2015  . Medicare annual wellness exam 03/11/2015  . Other abnormal glucose 03/09/2015  . Medication management 06/27/2013  . HTN (hypertension)   . Hyperlipidemia   . Vitamin D deficiency   . Degenerative joint disease   . CKD Stage III  (GFR 49 ml/min)     History reviewed. No pertinent surgical history.  OB History    No data available       Home Medications    Prior to Admission medications   Medication Sig Start Date End Date Taking? Authorizing Provider  aspirin 81 MG tablet Take 81 mg by mouth daily.   Yes [provider]  Biotin 2500 MCG CAPS Take by mouth daily.   Yes [provider]    Cholecalciferol (VITAMIN D3) 2000 UNITS TABS Take by mouth. Taking 4000 units daily   Yes [provider]  CINNAMON PO Take 2,000 mg by mouth daily.   Yes [provider]  fish oil-omega-3 fatty acids 1000 MG capsule Take 2 g by mouth daily. 1400 mg   Yes [provider]  OVER THE COUNTER MEDICATION Takes vitamin for her eyes 1 daily   Yes [provider]  TURMERIC PO Take 1,000 mg by mouth 2 (two) times daily.   Yes [provider]  amLODipine (NORVASC) 5 MG tablet Take 1 tablet (5 mg total) by mouth daily. 04/17/17   Alvira Monday, MD  hydrochlorothiazide (HYDRODIURIL) 12.5 MG tablet Take 2 tablets (25 mg total) by mouth daily. 04/17/17   Alvira Monday, MD    Family History Family History  Problem Relation Age of Onset  . CVA Father   . Cancer Daughter        breast    Social History Social History   Tobacco Use  . Smoking status: Never Smoker  . Smokeless tobacco: Never Used  Substance Use Topics  . Alcohol use: Yes    Alcohol/week: 0.6 oz    Types: 1 Glasses of wine per week    Comment: occasional wine  . Drug use: No     Allergies   Ace inhibitors  Review of Systems Review of Systems  Constitutional: Negative for fever.  HENT: Negative for sore throat.   Eyes: Negative for visual disturbance.  Respiratory: Negative for cough and shortness of breath.   Cardiovascular: Negative for chest pain.  Gastrointestinal: Negative for abdominal pain, diarrhea, nausea and vomiting.  Genitourinary: Negative for difficulty urinating.  Musculoskeletal: Negative for back pain and neck pain.  Skin: Negative for rash.  Neurological: Positive for numbness and headaches. Negative for dizziness, syncope, facial asymmetry, speech difficulty and weakness.     Physical Exam Updated Vital Signs BP (!) 173/64   Pulse 65   Temp 98.3 F (36.8 C) (Oral)   Resp 16   Ht 5\' 2"  (1.575 m)   Wt 51.7 kg (114 lb)   SpO2 99%   BMI 20.85  kg/m   Physical Exam  Constitutional: She is oriented to person, place, and time. She appears well-developed and well-nourished. No distress.  HENT:  Head: Normocephalic and atraumatic.  Eyes: Conjunctivae and EOM are normal.  Neck: Normal range of motion.  Cardiovascular: Normal rate, regular rhythm, normal heart sounds and intact distal pulses. Exam reveals no gallop and no friction rub.  No murmur heard. Pulmonary/Chest: Effort normal and breath sounds normal. No respiratory distress. She has no wheezes. She has no rales.  Abdominal: Soft. She exhibits no distension. There is no tenderness. There is no guarding.  Musculoskeletal: She exhibits no edema or tenderness.  Neurological: She is alert and oriented to person, place, and time. She has normal strength. No cranial nerve deficit or sensory deficit. Coordination normal. GCS eye subscore is 4. GCS verbal subscore is 5. GCS motor subscore is 6.  Normal finger to nose, peripheral vision, CN, EOM, symmetric facies, excellent bilateral strength  Skin: Skin is warm and dry. No rash noted. She is not diaphoretic. No erythema.  Nursing note and vitals reviewed.    ED Treatments / Results  Labs (all labs ordered are listed, but only abnormal results are displayed) Labs Reviewed  CBC WITH DIFFERENTIAL/PLATELET - Abnormal; Notable for the following components:      Result Value   Hemoglobin 11.6 (*)    All other components within normal limits  COMPREHENSIVE METABOLIC PANEL - Abnormal; Notable for the following components:   Glucose, Bld 107 (*)    Creatinine, Ser 1.05 (*)    ALT 11 (*)    GFR calc non Af Amer 47 (*)    GFR calc Af Amer 55 (*)    All other components within normal limits    EKG  EKG Interpretation None       Radiology Ct Head Wo Contrast  Result Date: 04/17/2017 CLINICAL DATA:  Acute headache. EXAM: CT HEAD WITHOUT CONTRAST TECHNIQUE: Contiguous axial images were obtained from the base of the skull through  the vertex without intravenous contrast. COMPARISON:  None. FINDINGS: Brain: No evidence of acute infarction, hemorrhage, hydrocephalus, extra-axial collection or mass lesion/mass effect. Vascular: No hyperdense vessel or unexpected calcification. Skull: Normal. Negative for fracture or focal lesion. Sinuses/Orbits: No acute finding. Other: None. IMPRESSION: Normal head CT. Electronically Signed   By: Lupita Raider, M.D.   On: 04/17/2017 12:11    Procedures Procedures (including critical care time)  Medications Ordered in ED Medications  amLODipine (NORVASC) tablet 5 mg (5 mg Oral Given 04/17/17 1148)  hydrALAZINE (APRESOLINE) tablet 10 mg (10 mg Oral Given 04/17/17 1222)  LORazepam (ATIVAN) tablet 0.5 mg (0.5 mg Oral Given 04/17/17 1818)     Initial  Impression / Assessment and Plan / ED Course  I have reviewed the triage vital signs and the nursing notes.  Pertinent labs & imaging results that were available during my care of the patient were reviewed by me and considered in my medical decision making (see chart for details).     84 year old female with a history of hypertension and hyperlipidemia, no longer on blood pressure or cholesterol medications, presented to her PCP this morning with concern for lip numbness, headache and hypertension.  Patient asymptomatic initially while in the emergency department, noting only 5 minutes of localized lip numbness to one area of her lower lip.  Her neurologic exam is within normal limits.  She not have chest pain, shortness of breath, no significant laboratory abnormalities, no sign of hypertensive emergency.  CT head was done given her history of headaches and showed no acute abnormalities.  Initially low suspicion for localized area of lip numbness to represent stroke or TIA, and discussed this with neurology.  Initially discussed with patient plan for discharge with initiation of metoprolol hydrochlorothiazide for blood pressures, and close  follow-up with her primary care physician on Wednesday.  Just prior to time of discharge, patient reports that the numbness has returned and it has spread to the upper lip as well.  Will evaluate with MRI to look for signs of CVA.  Given atypical symptoms, still have low suspicion this represents TIA, and if MRI is negative, feel she is stable for follow-up with her doctor as scheduled, and initiating blood pressure medications.  Final Clinical Impressions(s) / ED Diagnoses   Final diagnoses:  Essential hypertension  Acute nonintractable headache, unspecified headache type  Lip numbness    ED Discharge Orders        Ordered    amLODipine (NORVASC) 5 MG tablet  Daily     04/17/17 1436    hydrochlorothiazide (HYDRODIURIL) 12.5 MG tablet  Daily     04/17/17 1436       Alvira MondaySchlossman, Aowyn Rozeboom, MD 04/17/17 1914

## 2017-04-17 NOTE — ED Triage Notes (Signed)
Per EMS: Pt went to PCP today for HA and pinpoint lip numbness. Pt currently has no complaints.  Initial pressure was 260/110 manually. Pt has no neuro deficits. A&Ox4. Pt was taken off HPTN meds last year and does not know why.

## 2017-04-17 NOTE — Discharge Instructions (Addendum)
We glad you are feeling better. Your labs, MRI and CT were all reassuing, please follow up with your primary care provider.

## 2017-04-17 NOTE — Progress Notes (Addendum)
Patient ID: Ann [name], female   DOB: 1930-08-12, 84 y.o.   MRN: [id number]  MEDICARE ANNUAL WELLNESS VISIT AND FOLLOW UP  Assessment:   Hypertensive crisis with HA and left facial/lip numbness that has since resolved Patient lives alone, drove self here Sent via ambulance Rule out TIA/end organ damage Risk factor modification discussed will need to start cholesterol medication and BP medication  Essential hypertension Not at goal, will need to be on BP medication - continue medications, DASH diet, exercise and monitor at home. Call if greater than 130/80.   Osteoporosis, unspecified osteoporosis type, unspecified pathological fracture presence Monitor  CKD Stage III  (GFR 49 ml/min) Increase fluids, avoid NSAIDS, monitor sugars, will monitor  Hyperlipidemia, unspecified hyperlipidemia type -continue medications, check lipids, decrease fatty foods, increase activity.  Will need to be restarted on cholesterol medication as well  Medication management Monitor  Other abnormal glucose Monitor  Vitamin D deficiency Continue supplement  Primary osteoarthritis involving multiple joints Monitor  Medicare annual wellness exam 1 year, get MGM July  Over 30 minutes of exam, counseling, chart review, and critical decision making was performed  Plan:   During the course of the visit the patient was educated and counseled about appropriate screening and preventive services including:    Pneumococcal vaccine   Influenza vaccine  Td vaccine  Prevnar 13  Screening electrocardiogram  Screening mammography  Bone densitometry screening  Colorectal cancer screening  Diabetes screening  Glaucoma screening  Nutrition counseling   Advanced directives: given info/requested copies   Subjective:   Ann [name] is a 84 y.o. female who presents for Medicare Annual Wellness Visit and acute visit.   This AM she got up to go to the bathroom, she states that her  left bottom lip and ear felt numb, she took 3 bASA orally,  and has had nagging headache for 1 week. She also saw Dr. Elmer PickerHecker who said she needs to follow up with PCP for HTN due to eye exam and that she could have a stroke. No rash. She is on bASA daily. She was taken off her chol medication Jan 2018, and is not on BP medication, unknown reason at that time.   She denies any associated neurological complications or symptoms, such as one-sided weakness, tingling, slurring of speech, droopy face, swallowing difficulties, diplopia, vision loss, hearing loss or tinnitus, worst HA ever, SOB, CP.    Her blood pressure has NOT been controlled at home, today their BP is BP: (!) 220/100 She does workout. She denies chest pain, shortness of breath, dizziness.  She is not on cholesterol medication and denies myalgias. Her cholesterol is at goal. The cholesterol last visit was:   Lab Results  Component Value Date   CHOL 357 (H) 10/09/2016   HDL 69 10/09/2016   LDLCALC 264 (H) 10/09/2016   TRIG 119 10/09/2016   CHOLHDL 5.2 (H) 10/09/2016   She has been working on diet and exercise for prediabetes, and denies foot ulcerations, hyperglycemia, hypoglycemia , increased appetite, nausea, paresthesia of the feet, polydipsia, polyuria, visual disturbances, vomiting and weight loss. Last A1C in the office was:  Lab Results  Component Value Date   HGBA1C 5.7 (H) 10/09/2016   Last GFR   Lab Results  Component Value Date   GFRNONAA 48 (L) 10/09/2016   Patient is on Vitamin D supplement. Lab Results  Component Value Date   VD25OH 65 10/09/2016     BMI is Body mass index is 20.89 kg/m., she  is working on diet and exercise. Wt Readings from Last 3 Encounters:  04/17/17 114 lb (51.7 kg)  04/17/17 114 lb 3.2 oz (51.8 kg)  10/09/16 114 lb 12.8 oz (52.1 kg)    Medication Review Current Outpatient Medications on File Prior to Visit  Medication Sig Dispense Refill  . aspirin 81 MG tablet Take 81 mg by mouth  daily.    . Biotin 2500 MCG CAPS Take by mouth daily.    . Cholecalciferol (VITAMIN D3) 2000 UNITS TABS Take by mouth. Taking 4000 units daily    . CINNAMON PO Take 2,000 mg by mouth daily.    . fish oil-omega-3 fatty acids 1000 MG capsule Take 2 g by mouth daily. 1400 mg    . OVER THE COUNTER MEDICATION Takes vitamin for her eyes 1 daily    . TURMERIC PO Take 1,000 mg by mouth 2 (two) times daily.     No current facility-administered medications on file prior to visit.     Current Problems (verified) Patient Active Problem List   Diagnosis Date Noted  . Osteoporosis 10/15/2015  . Medicare annual wellness exam 03/11/2015  . Other abnormal glucose 03/09/2015  . Medication management 06/27/2013  . HTN (hypertension)   . Hyperlipidemia   . Vitamin D deficiency   . Degenerative joint disease   . CKD Stage III  (GFR 49 ml/min)     Screening Tests Immunization History  Administered Date(s) Administered  . Influenza Split 01/17/2013  . Influenza, High Dose Seasonal PF 01/02/2014, 01/16/2015, 01/02/2016  . Pneumococcal Conjugate-13 06/08/2015  . Pneumococcal Polysaccharide-23 06/04/2010  . Pneumococcal-Unspecified 01/31/1999  . Td 04/01/1995, 12/05/2005, 10/23/2016    Preventative care: Last colonoscopy:  2006 declines another due to age Last mammogram: 10/15/2015 due 09/2017 DEXA: 09/2015  Prior vaccinations: TD or Tdap: 2007  Influenza: 2016  Pneumococcal: 2012 Prevnar13: Due today,  Shingles/Zostavax: Declined  Names of Other Physician/Practitioners you currently use: 1. [facility]- here for primary care 2. Dr. Elmer Picker, eye doctor, last visit 03/26/2017 3. Dr. Autumn Messing, dentist, last visit 2016 Patient Care Team: Lucky Cowboy, MD as PCP - General (Internal Medicine)  Allergies Allergies  Allergen Reactions  . Ace Inhibitors     Hyperkalemia     SURGICAL HISTORY She  has no past surgical history on file. FAMILY  HISTORY Her family history includes CVA in her father; Cancer in her daughter. SOCIAL HISTORY She  reports that  has never smoked. she has never used smokeless tobacco. She reports that she drinks about 0.6 oz of alcohol per week. She reports that she does not use drugs.  MEDICARE WELLNESS OBJECTIVES: Physical activity: Current Exercise Habits: Home exercise routine, Type of exercise: walking(bikes at home), Time (Minutes): 30, Frequency (Times/Week): 3, Weekly Exercise (Minutes/Week): 90, Intensity: Mild Cardiac risk factors: Cardiac Risk Factors include: advanced age (>42men, >14 women);dyslipidemia;hypertension Depression/mood screen:   Depression screen [facility] 2/9 04/17/2017  Decreased Interest 0  Down, Depressed, Hopeless 0  PHQ - 2 Score 0    ADLs:  In your present state of health, do you have any difficulty performing the following activities: 04/17/2017 10/11/2016  Hearing? N N  Vision? N N  Difficulty concentrating or making decisions? N N  Walking or climbing stairs? N N  Dressing or bathing? N N  Doing errands, shopping? N N  Some recent data might be hidden     Cognitive Testing  Alert? Yes  Normal Appearance?Yes  Oriented to person? Yes  Place? Yes  Time? Yes  Recall of three objects?  Yes  Can perform simple calculations? Yes  Displays appropriate judgment?Yes  Can read the correct time from a watch face?Yes  EOL planning: Does Patient Have a Medical Advance Directive?: No Would patient like information on creating a medical advance directive?: Yes (MAU/Ambulatory/Procedural Areas - Information given)   Objective:   Today's Vitals   04/17/17 0945  BP: (!) 220/100  Pulse: 76  Resp: 16  Temp: 97.7 F (36.5 C)  SpO2: 96%  Weight: 114 lb 3.2 oz (51.8 kg)  Height: 5\' 2"  (1.575 m)  PainSc: 0-No pain   Body mass index is 20.89 kg/m.  General appearance: alert, no distress, WD/WN,  female HEENT: normocephalic, sclerae anicteric, TMs pearly, nares patent, no  discharge or erythema, pharynx normal Oral cavity: MMM, no lesions Neck: supple, no lymphadenopathy, no thyromegaly, no masses Heart: RRR, normal S1, S2, no murmurs Lungs: CTA bilaterally, no wheezes, rhonchi, or rales Abdomen: +bs, soft, non tender, non distended, no masses, no hepatomegaly, no splenomegaly Musculoskeletal: nontender, no swelling, no obvious deformity Extremities: no edema, no cyanosis, no clubbing Pulses: 2+ symmetric, upper and lower extremities, normal cap refill Neurological: alert, oriented x 3, CN2-12 intact, strength normal upper extremities and lower extremities, sensation normal throughout, DTRs 2+ throughout, no cerebellar signs, gait normal Psychiatric: normal affect, behavior normal, pleasant  Breast: defer Gyn: defer Rectal: defer   Medicare Attestation I have personally reviewed: The patient's medical and social history Their use of alcohol, tobacco or illicit drugs Their current medications and supplements The patient's functional ability including ADLs,fall risks, home safety risks, cognitive, and hearing and visual impairment Diet and physical activities Evidence for depression or mood disorders  The patient's weight, height, BMI, and visual acuity have been recorded in the chart.  I have made referrals, counseling, and provided education to the patient based on review of the above and I have provided the patient with a written personalized care plan for preventive services.     Quentin Mulling, PA-C   04/17/2017

## 2017-04-17 NOTE — ED Notes (Signed)
Pt verbalized understanding discharge instructions and denies any further needs or questions at this time. VS stable, ambulatory and steady gait.   

## 2017-04-17 NOTE — ED Notes (Signed)
Pt now c/o numbness in her lips again. The numbness has spread.

## 2017-04-21 NOTE — Progress Notes (Signed)
Assessment and Plan:   Essential hypertension Will increase norvasc to 5 mg BID and HCTZ once in the morning Follow up 4-6 weeks with BP log  CKD Stage III  (GFR 49 ml/min) Increase fluids, avoid NSAIDS, monitor sugars, will monitor  Hyperlipidemia, unspecified hyperlipidemia type Will restart low dose simvastatin 20mg  at night, recheck chol/LFTs at next OV  Chronic otitis media of both ears with effusion -     Ambulatory referral to ENT  Decreased hearing of both ears -     Ambulatory referral to ENT  Other orders -     amLODipine (NORVASC) 5 MG tablet; Take 1 tablet (5 mg total) by mouth 2 (two) times daily. -     simvastatin (ZOCOR) 20 MG tablet; Take 1 tablet (20 mg total) by mouth every evening.   CLOSE FOLLOW UP 4-6 WEEKS, WILL GET LABS AT THAT TIME Continue diet and meds as discussed. Further disposition pending results of labs. Over 30 minutes of exam, counseling, chart review, and critical decision making was performed   Future Appointments  Date Time Provider Department Center  04/22/2017  9:30 AM Quentin Mulling, PA-C [facility] None  11/13/2017 10:00 AM Lucky Cowboy, MD [facility] None    HPI 84 y.o. female  presents for ER follow up.   She has been having fluid in her ears for over a year, affecting hearing, hears "roaring", has been on sudafed which could have elevated her BP as well. She will stop this and we will send her to an ENT.   She was seen 01/18 with hypertensive crisis, had normal CT and MRI, started on BP meds and was disharged, she is here for follow up today. She was started on norvasc 5 mg and HCTZ 12.5mg . She continues to have intermittent lip numbness.  Lab Results  Component Value Date   GFRNONAA 47 (L) 04/17/2017   Her blood pressure has been controlled at home, today their BP is BP: (!) 144/84    She does workout, will start walking again, walks in her neighborhood. She denies chest pain, shortness of breath, dizziness.   She is not  on cholesterol medication and denies myalgias. Her cholesterol is at goal. The cholesterol last visit was:   Lab Results  Component Value Date   CHOL 357 (H) 10/09/2016   HDL 69 10/09/2016   LDLCALC 264 (H) 10/09/2016   TRIG 119 10/09/2016   CHOLHDL 5.2 (H) 10/09/2016    She has been working on diet and exercise for prediabetes, and denies paresthesia of the feet, polydipsia, polyuria and visual disturbances. Last A1C in the office was:  Lab Results  Component Value Date   HGBA1C 5.7 (H) 10/09/2016    Current Medications:  Current Outpatient Medications on File Prior to Visit  Medication Sig Dispense Refill  . amLODipine (NORVASC) 5 MG tablet Take 1 tablet (5 mg total) by mouth daily. 30 tablet 0  . aspirin 81 MG tablet Take 81 mg by mouth daily.    . Biotin 2500 MCG CAPS Take by mouth daily.    . Cholecalciferol (VITAMIN D3) 2000 UNITS TABS Take by mouth. Taking 4000 units daily    . CINNAMON PO Take 2,000 mg by mouth daily.    . fish oil-omega-3 fatty acids 1000 MG capsule Take 2 g by mouth daily. 1400 mg    . hydrochlorothiazide (HYDRODIURIL) 12.5 MG tablet Take 2 tablets (25 mg total) by mouth daily. 30 tablet 0  . OVER THE COUNTER MEDICATION Takes vitamin for  her eyes 1 daily    . TURMERIC PO Take 1,000 mg by mouth 2 (two) times daily.     No current facility-administered medications on file prior to visit.    Medical History:  Past Medical History:  Diagnosis Date  . Anemia of chronic renal failure   . Chronic renal insufficiency   . Degenerative joint disease   . HTN (hypertension)   . Hyperlipidemia   . Shingles   . Vitamin D deficiency    Allergies:  Allergies  Allergen Reactions  . Ace Inhibitors     Hyperkalemia     Review of Systems:  Review of Systems  Constitutional: Negative for chills, fever and malaise/fatigue.  HENT: Positive for congestion and ear pain. Negative for sore throat.   Eyes: Negative.   Respiratory: Negative for cough, shortness of  breath and wheezing.   Cardiovascular: Negative for chest pain, palpitations and leg swelling.  Gastrointestinal: Negative for blood in stool, constipation, diarrhea, heartburn and melena.  Genitourinary: Negative.        + stress incontinence with cough, sneezing, picking things up.   Skin: Negative.   Neurological: Negative for dizziness, sensory change, loss of consciousness and headaches.  Psychiatric/Behavioral: Negative for depression. The patient is not nervous/anxious and does not have insomnia.     Family history- Review and unchanged Social history- Review and unchanged Physical Exam: BP (!) 144/84   Pulse 74   Temp (!) 97.4 F (36.3 C)   Resp 14   Ht 5\' 2"  (1.575 m)   Wt 110 lb 12.8 oz (50.3 kg)   SpO2 95%   BMI 20.27 kg/m  Wt Readings from Last 3 Encounters:  04/22/17 110 lb 12.8 oz (50.3 kg)  04/17/17 114 lb (51.7 kg)  04/17/17 114 lb 3.2 oz (51.8 kg)   General Appearance: Well nourished, in no apparent distress. Eyes: PERRLA, EOMs, conjunctiva no swelling or erythema Sinuses: No Frontal/maxillary tenderness ENT/Mouth: Ext aud canals clear, TMs without erythema, bulging. No erythema, swelling, or exudate on post pharynx.  Tonsils not swollen or erythematous. Hearing normal.  Neck: Supple, thyroid normal.  Respiratory: Respiratory effort normal, BS equal bilaterally without rales, rhonchi, wheezing or stridor.  Cardio: RRR with no MRGs. Brisk peripheral pulses without edema.  Abdomen: Soft, + BS,  Non tender, no guarding, rebound, hernias, masses. Lymphatics: Non tender without lymphadenopathy.  Musculoskeletal: Full ROM, 5/5 strength, Normal gait Skin: Warm, dry without rashes, lesions, ecchymosis.  Neuro: Cranial nerves intact. Normal muscle tone, no cerebellar symptoms. Psych: Awake and oriented X 3, normal affect, Insight and Judgment appropriate.    Quentin MullingAmanda Charlis Harner, PA-C 9:28 AM [facility]

## 2017-04-22 ENCOUNTER — Ambulatory Visit (INDEPENDENT_AMBULATORY_CARE_PROVIDER_SITE_OTHER): Payer: PPO | Admitting: Physician Assistant

## 2017-04-22 ENCOUNTER — Encounter: Payer: Self-pay | Admitting: Physician Assistant

## 2017-04-22 VITALS — BP 144/84 | HR 74 | Temp 97.4°F | Resp 14 | Ht 62.0 in | Wt 110.8 lb

## 2017-04-22 DIAGNOSIS — H9193 Unspecified hearing loss, bilateral: Secondary | ICD-10-CM

## 2017-04-22 DIAGNOSIS — N183 Chronic kidney disease, stage 3 unspecified: Secondary | ICD-10-CM

## 2017-04-22 DIAGNOSIS — I10 Essential (primary) hypertension: Secondary | ICD-10-CM | POA: Diagnosis not present

## 2017-04-22 DIAGNOSIS — H65493 Other chronic nonsuppurative otitis media, bilateral: Secondary | ICD-10-CM

## 2017-04-22 DIAGNOSIS — E785 Hyperlipidemia, unspecified: Secondary | ICD-10-CM | POA: Diagnosis not present

## 2017-04-22 DIAGNOSIS — Z79899 Other long term (current) drug therapy: Secondary | ICD-10-CM | POA: Diagnosis not present

## 2017-04-22 MED ORDER — AMLODIPINE BESYLATE 5 MG PO TABS: 5.0000 mg | ORAL_TABLET | Freq: Two times a day (BID) | ORAL | 6 refills | Status: DC

## 2017-04-22 MED ORDER — SIMVASTATIN 20 MG PO TABS: 20.0000 mg | ORAL_TABLET | Freq: Every evening | ORAL | 1 refills | Status: DC

## 2017-04-22 NOTE — Patient Instructions (Addendum)
Restart cholesterol medication simvastatin take daily at night  Take norvasc/amlodipine 5mg  in the morning with HCTZ and norvasc 5 mg at night  Continue to monitor BP at home and record numbers Stop the sudafed and we will send you to ENT  Your ears and sinuses are connected by the eustachian tube. When your sinuses are inflamed, this can close off the tube and cause fluid to collect in your middle ear. This can then cause dizziness, popping, clicking, ringing, and echoing in your ears. This is often NOT an infection and does NOT require antibiotics, it is caused by inflammation so the treatments help the inflammation. This can take a long time to get better so please be patient.  Here are things you can do to help with this: - Try the Flonase or Nasonex. Remember to spray each nostril twice towards the outer part of your eye.  Do not sniff but instead pinch your nose and tilt your head back to help the medicine get into your sinuses.  The best time to do this is at bedtime.Stop if you get blurred vision or nose bleeds.  -While drinking fluids, pinch and hold nose close and swallow, to help open eustachian tubes to drain fluid behind ear drums. -Please pick one of the over the counter allergy medications below and take it once daily for allergies.  It will also help with fluid behind ear drums. Claritin or loratadine cheapest but likely the weakest  Zyrtec or certizine at night because it can make you sleepy The strongest is allegra or fexafinadine  Cheapest at [employer], [employer], [employer] -DO NOT USE SUDAFED- can use decongestant over the counter, please do not use if you have high blood pressure or certain heart conditions.   if worsening HA, changes vision/speech, imbalance, weakness go to the ER  Monitor your blood pressure at home. Go to the ER if any CP, SOB, nausea, dizziness, severe HA, changes vision/speech  Goal BP:  For patients younger than 60: Goal BP < 140/90. For patients 60 and  older: Goal BP < 150/90. For patients with diabetes: Goal BP < 140/90. Your most recent BP: BP: (!) 144/84   Take your medications faithfully as instructed. Maintain a healthy weight. Get at least 150 minutes of aerobic exercise per week. Minimize salt intake. Minimize alcohol intake  DASH Eating Plan DASH stands for "Dietary Approaches to Stop Hypertension." The DASH eating plan is a healthy eating plan that has been shown to reduce high blood pressure (hypertension). Additional health benefits may include reducing the risk of type 2 diabetes mellitus, heart disease, and stroke. The DASH eating plan may also help with weight loss. WHAT DO I NEED TO KNOW ABOUT THE DASH EATING PLAN? For the DASH eating plan, you will follow these general guidelines:  Choose foods with a percent daily value for sodium of less than 5% (as listed on the food label).  Use salt-free seasonings or herbs instead of table salt or sea salt.  Check with your health care provider or pharmacist before using salt substitutes.  Eat lower-sodium products, often labeled as "lower sodium" or "no salt added."  Eat fresh foods.  Eat more vegetables, fruits, and low-fat dairy products.  Choose whole grains. Look for the word "whole" as the first word in the ingredient list.  Choose fish and skinless chicken or [place] more often than red meat. Limit fish, poultry, and meat to 6 oz (170 g) each day.  Limit sweets, desserts, sugars, and sugary drinks.  Choose heart-healthy fats.  Limit cheese to 1 oz (28 g) per day.  Eat more home-cooked food and less restaurant, buffet, and fast food.  Limit fried foods.  Cook foods using methods other than frying.  Limit canned vegetables. If you do use them, rinse them well to decrease the sodium.  When eating at a restaurant, ask that your food be prepared with less salt, or no salt if possible. WHAT FOODS CAN I EAT? Seek help from a dietitian for individual calorie  needs. Grains Whole grain or whole wheat bread. Brown rice. Whole grain or whole wheat pasta. Quinoa, bulgur, and whole grain cereals. Low-sodium cereals. Corn or whole wheat flour tortillas. Whole grain cornbread. Whole grain crackers. Low-sodium crackers. Vegetables Fresh or frozen vegetables (raw, steamed, roasted, or grilled). Low-sodium or reduced-sodium tomato and vegetable juices. Low-sodium or reduced-sodium tomato sauce and paste. Low-sodium or reduced-sodium canned vegetables.  Fruits All fresh, canned (in natural juice), or frozen fruits. Meat and Other Protein Products Ground beef (85% or leaner), grass-fed beef, or beef trimmed of fat. Skinless chicken or [place]. Ground chicken or [place]. Pork trimmed of fat. All fish and seafood. Eggs. Dried beans, peas, or lentils. Unsalted nuts and seeds. Unsalted canned beans. Dairy Low-fat dairy products, such as skim or 1% milk, 2% or reduced-fat cheeses, low-fat ricotta or cottage cheese, or plain low-fat yogurt. Low-sodium or reduced-sodium cheeses. Fats and Oils Tub margarines without trans fats. Light or reduced-fat mayonnaise and salad dressings (reduced sodium). Avocado. Safflower, olive, or canola oils. Natural peanut or almond butter. Other Unsalted popcorn and pretzels. The items listed above may not be a complete list of recommended foods or beverages. Contact your dietitian for more options. WHAT FOODS ARE NOT RECOMMENDED? Grains White bread. White pasta. White rice. Refined cornbread. Bagels and croissants. Crackers that contain trans fat. Vegetables Creamed or fried vegetables. Vegetables in a cheese sauce. Regular canned vegetables. Regular canned tomato sauce and paste. Regular tomato and vegetable juices. Fruits Dried fruits. Canned fruit in light or heavy syrup. Fruit juice. Meat and Other Protein Products Fatty cuts of meat. Ribs, chicken wings, bacon, sausage, bologna, salami, chitterlings, fatback, hot dogs, bratwurst,  and packaged luncheon meats. Salted nuts and seeds. Canned beans with salt. Dairy Whole or 2% milk, cream, half-and-half, and cream cheese. Whole-fat or sweetened yogurt. Full-fat cheeses or blue cheese. Nondairy creamers and whipped toppings. Processed cheese, cheese spreads, or cheese curds. Condiments Onion and garlic salt, seasoned salt, table salt, and sea salt. Canned and packaged gravies. Worcestershire sauce. Tartar sauce. Barbecue sauce. Teriyaki sauce. Soy sauce, including reduced sodium. Steak sauce. Fish sauce. Oyster sauce. Cocktail sauce. Horseradish. Ketchup and mustard. Meat flavorings and tenderizers. Bouillon cubes. Hot sauce. Tabasco sauce. Marinades. Taco seasonings. Relishes. Fats and Oils Butter, stick margarine, lard, shortening, ghee, and bacon fat. Coconut, palm kernel, or palm oils. Regular salad dressings. Other Pickles and olives. Salted popcorn and pretzels. The items listed above may not be a complete list of foods and beverages to avoid. Contact your dietitian for more information. WHERE CAN I FIND MORE INFORMATION? National Heart, Lung, and Blood Institute: [employer] Document Released: 03/06/2011 Document Revised: 08/01/2013 Document Reviewed: 01/19/2013 2020 [facility] Patient Information 2015 [place], [place]. This information is not intended to replace advice given to you by your health care provider. Make sure you discuss any questions you have with your health care provider.

## 2017-04-29 ENCOUNTER — Other Ambulatory Visit: Payer: Self-pay

## 2017-04-29 DIAGNOSIS — H6123 Impacted cerumen, bilateral: Secondary | ICD-10-CM | POA: Diagnosis not present

## 2017-04-29 MED ORDER — HYDROCHLOROTHIAZIDE 12.5 MG PO TABS: 25.0000 mg | ORAL_TABLET | Freq: Every day | ORAL | 0 refills | Status: DC

## 2017-05-21 ENCOUNTER — Ambulatory Visit (INDEPENDENT_AMBULATORY_CARE_PROVIDER_SITE_OTHER): Payer: PPO | Admitting: Internal Medicine

## 2017-05-21 ENCOUNTER — Other Ambulatory Visit: Payer: Self-pay

## 2017-05-21 ENCOUNTER — Encounter: Payer: Self-pay | Admitting: Internal Medicine

## 2017-05-21 VITALS — BP 154/68 | HR 80 | Temp 97.3°F | Resp 16 | Ht 62.0 in | Wt 114.0 lb

## 2017-05-21 DIAGNOSIS — I10 Essential (primary) hypertension: Secondary | ICD-10-CM

## 2017-05-21 DIAGNOSIS — E559 Vitamin D deficiency, unspecified: Secondary | ICD-10-CM

## 2017-05-21 DIAGNOSIS — R7303 Prediabetes: Secondary | ICD-10-CM | POA: Diagnosis not present

## 2017-05-21 DIAGNOSIS — D631 Anemia in chronic kidney disease: Secondary | ICD-10-CM | POA: Diagnosis not present

## 2017-05-21 DIAGNOSIS — N183 Chronic kidney disease, stage 3 unspecified: Secondary | ICD-10-CM

## 2017-05-21 DIAGNOSIS — R7309 Other abnormal glucose: Secondary | ICD-10-CM | POA: Diagnosis not present

## 2017-05-21 DIAGNOSIS — Z79899 Other long term (current) drug therapy: Secondary | ICD-10-CM | POA: Diagnosis not present

## 2017-05-21 DIAGNOSIS — E782 Mixed hyperlipidemia: Secondary | ICD-10-CM

## 2017-05-21 MED ORDER — OLMESARTAN MEDOXOMIL 20 MG PO TABS: ORAL_TABLET | ORAL | 1 refills | Status: DC

## 2017-05-21 MED ORDER — OLMESARTAN MEDOXOMIL 20 MG PO TABS
ORAL_TABLET | ORAL | 1 refills | Status: DC
Start: 2017-05-21 — End: 2017-05-21

## 2017-05-21 MED ORDER — HYDROCHLOROTHIAZIDE 12.5 MG PO TABS: ORAL_TABLET | ORAL | 1 refills | Status: DC

## 2017-05-21 MED ORDER — AMLODIPINE BESYLATE 5 MG PO TABS: ORAL_TABLET | ORAL | 1 refills | Status: DC

## 2017-05-21 NOTE — Progress Notes (Signed)
This very nice 84 y.o. WWF  presents for 6 month follow up with Hypertension, Hyperlipidemia, Pre-Diabetes and Vitamin D Deficiency.      On Jan 18, patient was evaluated in the ER with elev BP 173/64 , w/ facial numbness and head CT & MRI scans were negative. She had Amlodipine 5 mg  (later increased to bid) and HCTZ added to her med regimen.      Patient is treated for HTN since 1997  & BP has been controlled until recently as above. Today's was 154/68 by the Nurse & repeated at 170/60 by myself. Patient has had no complaints of any cardiac type chest pain, palpitations, dyspnea / orthopnea / PND, dizziness, claudication, any neuro sx's or dependent edema.     Hyperlipidemia is not controlled with diet  after she was told to stop Simvastatin in Jan 2018 by Terri Piedra, PA-C.  Last Lipids were not at goal: Lab Results  Component Value Date   CHOL 357 (H) 10/09/2016   HDL 69 10/09/2016   LDLCALC 264 (H) 10/09/2016   TRIG 119 10/09/2016   CHOLHDL 5.2 (H) 10/09/2016      Also, the patient has history of PreDiabetes (A1c 6.1%/2010 and 5.7%/2016) and has had no symptoms of reactive hypoglycemia, diabetic polys, paresthesias or visual blurring.  Last A1c was still not at goal: Lab Results  Component Value Date   HGBA1C 5.7 (H) 10/09/2016      Further, the patient also has history of Vitamin D Deficiency ("25"/2008)  and supplements vitamin D without any suspected side-effects. Last vitamin D was at goal:  Lab Results  Component Value Date   VD25OH 68 10/09/2016   Current Outpatient Medications on File Prior to Visit  Medication Sig  . amLODipine (NORVASC) 5 MG tablet Take 1 tablet (5 mg total) by mouth 2 (two) times daily.  Marland Kitchen aspirin 81 MG tablet Take 81 mg by mouth daily.  . Biotin 2500 MCG CAPS Take by mouth daily.  . Cholecalciferol (VITAMIN D3) 2000 UNITS TABS Take by mouth. Taking 4000 units daily  . CINNAMON PO Take 2,000 mg by mouth daily.  . fish oil-omega-3 fatty  acids 1000 MG capsule Take 2 g by mouth daily. 1400 mg  . hydrochlorothiazide (HYDRODIURIL) 12.5 MG tablet Take 2 tablets (25 mg total) by mouth daily.  Marland Kitchen OVER THE COUNTER MEDICATION Takes vitamin for her eyes 1 daily  . simvastatin (ZOCOR) 20 MG tablet Take 1 tablet (20 mg total) by mouth every evening.  . TURMERIC PO Take 1,000 mg by mouth 2 (two) times daily.   No current facility-administered medications on file prior to visit.    Allergies  Allergen Reactions  . Ace Inhibitors     Hyperkalemia    PMHx:   Past Medical History:  Diagnosis Date  . Anemia of chronic renal failure   . Chronic renal insufficiency   . Degenerative joint disease   . HTN (hypertension)   . Hyperlipidemia   . Shingles   . Vitamin D deficiency    Immunization History  Administered Date(s) Administered  . Influenza Split 01/17/2013  . Influenza, High Dose Seasonal PF 01/02/2014, 01/16/2015, 01/02/2016  . Pneumococcal Conjugate-13 06/08/2015  . Pneumococcal Polysaccharide-23 06/04/2010  . Pneumococcal-Unspecified 01/31/1999  . Td 04/01/1995, 12/05/2005, 10/23/2016   No past surgical history  FHx:    Reviewed / unchanged  SHx:    Reviewed / unchanged  Systems Review:  Constitutional: Denies fever, chills, wt changes, headaches,  insomnia, fatigue, night sweats, change in appetite. Eyes: Denies redness, blurred vision, diplopia, discharge, itchy, watery eyes.  ENT: Denies discharge, congestion, post nasal drip, epistaxis, sore throat, earache, hearing loss, dental pain, tinnitus, vertigo, sinus pain, snoring.  CV: Denies chest pain, palpitations, irregular heartbeat, syncope, dyspnea, diaphoresis, orthopnea, PND, claudication or edema. Respiratory: denies cough, dyspnea, DOE, pleurisy, hoarseness, laryngitis, wheezing.  Gastrointestinal: Denies dysphagia, odynophagia, heartburn, reflux, water brash, abdominal pain or cramps, nausea, vomiting, bloating, diarrhea, constipation, hematemesis, melena,  hematochezia  or hemorrhoids. Genitourinary: Denies dysuria, frequency, urgency, nocturia, hesitancy, discharge, hematuria or flank pain. Musculoskeletal: Denies arthralgias, myalgias, stiffness, jt. swelling, pain, limping or strain/sprain.  Skin: Denies pruritus, rash, hives, warts, acne, eczema or change in skin lesion(s). Neuro: No weakness, tremor, incoordination, spasms, paresthesia or pain. Psychiatric: Denies confusion, memory loss or sensory loss. Endo: Denies change in weight, skin or hair change.  Heme/Lymph: No excessive bleeding, bruising or enlarged lymph nodes.  Physical Exam  BP (!) 154/68   Pulse 80   Temp (!) 97.3 F (36.3 C)   Resp 16   Ht 5\' 2"  (1.575 m)   Wt 114 lb (51.7 kg)   BMI 20.85 kg/m   BP rechecked at 170/60   P 70  Appears well nourished, well groomed  and in no distress.  Eyes: PERRLA, EOMs, conjunctiva no swelling or erythema. Sinuses: No frontal/maxillary tenderness ENT/Mouth: EAC's clear, TM's nl w/o erythema, bulging. Nares clear w/o erythema, swelling, exudates. Oropharynx clear without erythema or exudates. Oral hygiene is good. Tongue normal, non obstructing. Hearing intact.  Neck: Supple. Thyroid not palpable. Car 2+/2+ without bruits, nodes or JVD. Chest: Respirations nl with BS clear & equal w/o rales, rhonchi, wheezing or stridor.  Cor: Heart sounds normal w/ regular rate and rhythm without sig. murmurs, gallops, clicks or rubs. Peripheral pulses normal and equal  without edema.  Abdomen: Soft & bowel sounds normal. Non-tender w/o guarding, rebound, hernias, masses or organomegaly.  Lymphatics: Unremarkable.  Musculoskeletal: Full ROM all peripheral extremities, joint stability, 5/5 strength and normal gait.  Skin: Warm, dry without exposed rashes, lesions or ecchymosis apparent.  Neuro: Cranial nerves intact, reflexes equal bilaterally. Sensory-motor testing grossly intact. Tendon reflexes grossly intact.  Pysch: Alert & oriented x 3.   Insight and judgement nl & appropriate. No ideations.  Assessment and Plan:  1. Essential hypertension  - Continue medication, monitor blood pressure at home.  - Continue DASH diet. Reminder to go to the ER if any CP,  SOB, nausea, dizziness, severe HA, changes vision/speech.  - CBC with Differential/Platelet - BASIC METABOLIC PANEL WITH GFR - Magnesium - TSH - Taper amLODipine (NORVASC) 5 MG tablet; Take 1 tablet every night for BP  Dispense: 90 tablet; Refill: 1 -Add Benicar 20 mg qd & ROV 2 weeks to recheck  2. Hyperlipidemia, mixed  - Continue diet/meds, exercise,& lifestyle modifications.  - Continue monitor periodic cholesterol/liver & renal functions   - Hepatic function panel - Lipid panel - TSH  3. Abnormal glucose  - Hemoglobin A1c - Insulin, random  4. Vitamin D deficiency  - VITAMIN D 25 Hydroxy  5. Prediabetes  - Hemoglobin A1c - Insulin, random  6. Anemia of chronic renal failure, stage 3 (moderate) (HCC)  - BASIC METABOLIC PANEL WITH GFR  7. Medication management  - CBC with Differential/Platelet - BASIC METABOLIC PANEL WITH GFR - Hepatic function panel - Magnesium - Lipid panel - TSH - Hemoglobin A1c - Insulin, random - VITAMIN D 25 Hydroxy  -  Continue diet, exercise, lifestyle modifications.  - Monitor appropriate labs. - Continue supplementation.        Discussed  regular exercise, BP monitoring, weight control to achieve/maintain BMI less than 25 and discussed med and SE's. Recommended labs to assess and monitor clinical status with further disposition pending results of labs. Over 30 minutes of exam, counseling, chart review was performed.

## 2017-05-21 NOTE — Patient Instructions (Signed)

## 2017-05-22 LAB — CBC WITH DIFFERENTIAL/PLATELET
BASOS ABS: 42 {cells}/uL (ref 0–200)
BASOS PCT: 0.8 %
Eosinophils Absolute: 53 cells/uL (ref 15–500)
Eosinophils Relative: 1 %
HEMATOCRIT: 37.6 % (ref 35.0–45.0)
HEMOGLOBIN: 12.2 g/dL (ref 11.7–15.5)
LYMPHS ABS: 1484 {cells}/uL (ref 850–3900)
MCH: 26.6 pg — ABNORMAL LOW (ref 27.0–33.0)
MCHC: 32.4 g/dL (ref 32.0–36.0)
MCV: 81.9 fL (ref 80.0–100.0)
MONOS PCT: 7.6 %
MPV: 11.9 fL (ref 7.5–12.5)
NEUTROS ABS: 3318 {cells}/uL (ref 1500–7800)
Neutrophils Relative %: 62.6 %
Platelets: 242 10*3/uL (ref 140–400)
RBC: 4.59 10*6/uL (ref 3.80–5.10)
RDW: 13.6 % (ref 11.0–15.0)
Total Lymphocyte: 28 %
WBC mixed population: 403 cells/uL (ref 200–950)
WBC: 5.3 10*3/uL (ref 3.8–10.8)

## 2017-05-22 LAB — LIPID PANEL
CHOL/HDL RATIO: 3.6 (calc) (ref <5.0)
CHOLESTEROL: 258 mg/dL — AB (ref <200)
HDL: 71 mg/dL (ref >50)
LDL CHOLESTEROL (CALC): 164 mg/dL — AB
Non-HDL Cholesterol (Calc): 187 mg/dL (calc) — ABNORMAL HIGH (ref <130)
TRIGLYCERIDES: 110 mg/dL (ref <150)

## 2017-05-22 LAB — BASIC METABOLIC PANEL WITH GFR
BUN/Creatinine Ratio: 22 (calc) (ref 6–22)
BUN: 22 mg/dL (ref 7–25)
CO2: 28 mmol/L (ref 20–32)
CREATININE: 1.01 mg/dL — AB (ref 0.60–0.88)
Calcium: 10.1 mg/dL (ref 8.6–10.4)
Chloride: 99 mmol/L (ref 98–110)
GFR, EST NON AFRICAN AMERICAN: 51 mL/min/{1.73_m2} — AB (ref >=60)
GFR, Est African American: 59 mL/min/{1.73_m2} — ABNORMAL LOW (ref >=60)
Glucose, Bld: 106 mg/dL — ABNORMAL HIGH (ref 65–99)
Potassium: 4.4 mmol/L (ref 3.5–5.3)
Sodium: 138 mmol/L (ref 135–146)

## 2017-05-22 LAB — HEMOGLOBIN A1C
Hgb A1c MFr Bld: 5.9 % of total Hgb — ABNORMAL HIGH (ref <5.7)
MEAN PLASMA GLUCOSE: 123 (calc)
eAG (mmol/L): 6.8 (calc)

## 2017-05-22 LAB — HEPATIC FUNCTION PANEL
AG RATIO: 1.8 (calc) (ref 1.0–2.5)
ALBUMIN MSPROF: 4.8 g/dL (ref 3.6–5.1)
ALT: 10 U/L (ref 6–29)
AST: 17 U/L (ref 10–35)
Alkaline phosphatase (APISO): 72 U/L (ref 33–130)
Bilirubin, Direct: 0.1 mg/dL (ref 0.0–0.2)
GLOBULIN: 2.6 g/dL (ref 1.9–3.7)
Indirect Bilirubin: 0.5 mg/dL (calc) (ref 0.2–1.2)
TOTAL PROTEIN: 7.4 g/dL (ref 6.1–8.1)
Total Bilirubin: 0.6 mg/dL (ref 0.2–1.2)

## 2017-05-22 LAB — TSH: TSH: 2.06 mIU/L (ref 0.40–4.50)

## 2017-05-22 LAB — MAGNESIUM: Magnesium: 2.1 mg/dL (ref 1.5–2.5)

## 2017-05-22 LAB — INSULIN, RANDOM: Insulin: 10.4 u[IU]/mL (ref 2.0–19.6)

## 2017-05-22 LAB — VITAMIN D 25 HYDROXY (VIT D DEFICIENCY, FRACTURES): VIT D 25 HYDROXY: 66 ng/mL (ref 30–100)

## 2017-05-25 ENCOUNTER — Other Ambulatory Visit: Payer: Self-pay | Admitting: Physician Assistant

## 2017-05-26 ENCOUNTER — Telehealth: Payer: Self-pay | Admitting: *Deleted

## 2017-05-26 NOTE — Telephone Encounter (Signed)
Patient started Benicar 20 mg last week and complained of dizzy feeling after taking the med. Her BP was 127/63 this AM.  Per Dr Oneta RackMcKeown the patient was advised to cut the tablet in half and continue to monitor her BP.

## 2017-06-03 ENCOUNTER — Encounter: Payer: Self-pay | Admitting: Internal Medicine

## 2017-06-03 ENCOUNTER — Ambulatory Visit (INDEPENDENT_AMBULATORY_CARE_PROVIDER_SITE_OTHER): Payer: PPO | Admitting: Internal Medicine

## 2017-06-03 VITALS — BP 136/60 | HR 72 | Temp 97.5°F | Resp 16 | Ht 62.0 in | Wt 114.4 lb

## 2017-06-03 DIAGNOSIS — I10 Essential (primary) hypertension: Secondary | ICD-10-CM | POA: Diagnosis not present

## 2017-06-03 NOTE — Progress Notes (Signed)
  Subjective:    Patient ID: Ann KusterInez A [name], female    DOB: [birth year], 84 y.o.   MRN: [id number]  HPI  This nice 84 yo WWF returns for BP f/u after recently increasing her meds following an ER visit for elevated BP & HA and she had Negative head CT & MRI scans. As BP was still elevated at last OV she was advised to taper her Amlodipine 5 mg bid to qd for concerns of edema and she was given a Rx for Olmesartan 20 mg. She decided to continue the Amlodipine 5 mg bid and only took a few doses of the Olmesartan and in addition she decided to stop her Simvastatin again.   Medication Sig  . amLODipine  5 MG tablet Take 1 tablet every night for BP - Taking 2 x/ day   . aspirin 81 MG tablet Take 81 mg by mouth daily.  . Biotin 2500 MCG CAPS Take by mouth daily.  Marland Kitchen. VITD 2000 UNITS Take by mouth. Taking 4000 units daily  . CINNAMON PO Take 2,000 mg by mouth daily.  . fish oil-omega-3  1000 MG  Take 2 g by mouth daily. 1400 mg  . hctz 12.5 MG  Take 1 tablet every morning for BP & Fluid  . Eye Vitamins Takes  1 daily  . TURMERIC PO Take 1,000 mg by mouth 2 (two) times daily.  Marland Kitchen. olmesartan 20 MG tablet Take 1 tablet every morning for BP - Not taking   . simvastatin ( 20 MG tablet Take 1 tab every evening.- Not Taking   Allergies  Allergen Reactions  . Ace Inhibitors     Hyperkalemia    Past Medical History:  Diagnosis Date  . Anemia of chronic renal failure   . Chronic renal insufficiency   . Degenerative joint disease   . HTN (hypertension)   . Hyperlipidemia   . Shingles   . Vitamin D deficiency    Review of Systems  10 point systems review negative except as above.    Objective:   Physical Exam  BP 136/60 Comment: right-136/60 and left 144/62  Pulse 72   Temp (!) 97.5 F (36.4 C)   Resp 16   Ht 5\' 2"  (1.575 m)   Wt 114 lb 6.4 oz (51.9 kg)   BMI 20.92 kg/m   Postural  Sitting BP 153/83   P 63    and      Standing BP 136/68   P 69  In no acute Distress  HEENT - WNL. Neck -  supple.  Chest - Clear equal BS. Cor - Nl HS. RRR w/o sig MGR. PP 1(+). No edema. MS- FROM w/o deformities.  Gait Nl. Neuro -  Nl w/o focal abnormalities. Skin - Clear    Assessment & Plan:   1. Essential hypertension  - Again advised that she taper her Amlodipine 5 mg t qd - Only and  - resume the Olmesartan 20 mg at 1/4-1/2 tab and monitor bid BP's & call if BP's elevated   -  In consideration of her age, will defer aggressive treatment of her lipidemia.

## 2017-06-08 ENCOUNTER — Other Ambulatory Visit: Payer: Self-pay | Admitting: Internal Medicine

## 2017-06-08 MED ORDER — TELMISARTAN 80 MG PO TABS: ORAL_TABLET | ORAL | 1 refills | Status: DC

## 2017-07-14 ENCOUNTER — Ambulatory Visit: Payer: Self-pay | Admitting: Physician Assistant

## 2017-08-19 ENCOUNTER — Other Ambulatory Visit: Payer: Self-pay | Admitting: Internal Medicine

## 2017-09-02 NOTE — Progress Notes (Signed)
FOLLOW UP  Assessment and Plan:   Hypertension Well controlled with current medications  Monitor blood pressure at home; patient to call if consistently greater than 130/80 Continue DASH diet.   Reminder to go to the ER if any CP, SOB, nausea, dizziness, severe HA, changes vision/speech, left arm numbness and tingling and jaw pain.  Cholesterol Currently above goal; not treated aggressively by medication due to age Continue to encourage low cholesterol diet and exercise.  Check lipid panel.   Prediabetes Continue diet and exercise.  Perform daily foot/skin check, notify office of any concerning changes.  Check A1C  Vitamin D Def At goal at last visit; continue supplementation to maintain goal of 70-100 Defer Vit D level  Continue diet and meds as discussed. Further disposition pending results of labs. Discussed med's effects and SE's.   Over 30 minutes of exam, counseling, chart review, and critical decision making was performed.   Future Appointments  Date Time Provider Department Center  12/07/2017 11:00 AM Lucky CowboyMcKeown, William, MD [facility] None    ----------------------------------------------------------------------------------------------------------------------  HPI 84 y.o. female  presents for 3 month follow up on hypertension, cholesterol, prediabetes, weight and vitamin D deficiency.   BMI is Body mass index is 21.03 kg/m., she has been working on diet and exercise. Wt Readings from Last 3 Encounters:  09/03/17 115 lb (52.2 kg)  06/03/17 114 lb 6.4 oz (51.9 kg)  05/21/17 114 lb (51.7 kg)   Her blood pressure has been controlled at home, today their BP is BP: 112/60  She does workout. She denies chest pain, shortness of breath, dizziness.   She is not on cholesterol medication secondary to age. Her cholesterol is not at goal. The cholesterol last visit was:   Lab Results  Component Value Date   CHOL 258 (H) 05/21/2017   HDL 71 05/21/2017   LDLCALC 164 (H)  05/21/2017   TRIG 110 05/21/2017   CHOLHDL 3.6 05/21/2017    She has been working on diet and exercise for prediabetes, and denies foot ulcerations, increased appetite, nausea, paresthesia of the feet, polydipsia, polyuria, visual disturbances, vomiting and weight loss. Last A1C in the office was:  Lab Results  Component Value Date   HGBA1C 5.9 (H) 05/21/2017   Patient is on Vitamin D supplement and at goal at recent check:   Lab Results  Component Value Date   VD25OH 66 05/21/2017        Current Medications:  Current Outpatient Medications on File Prior to Visit  Medication Sig  . amLODipine (NORVASC) 5 MG tablet Take 1 tablet every night for BP  . aspirin 81 MG tablet Take 81 mg by mouth daily.  . Biotin 2500 MCG CAPS Take by mouth daily.  . Cholecalciferol (VITAMIN D3) 2000 UNITS TABS Take by mouth. Taking 4000 units daily  . CINNAMON PO Take 2,000 mg by mouth daily.  . fish oil-omega-3 fatty acids 1000 MG capsule Take 2 g by mouth daily. 1400 mg  . hydrochlorothiazide (HYDRODIURIL) 12.5 MG tablet Take 1 tablet every morning for BP & Fluid  . OVER THE COUNTER MEDICATION Takes vitamin for her eyes 1 daily  . telmisartan (MICARDIS) 80 MG tablet Take 1 tablet daily for BP  . TURMERIC PO Take 1,000 mg by mouth 2 (two) times daily.  . hydrochlorothiazide (HYDRODIURIL) 12.5 MG tablet TAKE TWO TABLETS BY MOUTH DAILY (Patient not taking: Reported on 09/03/2017)   No current facility-administered medications on file prior to visit.      Allergies:  Allergies  Allergen Reactions  . Ace Inhibitors     Hyperkalemia      Medical History:  Past Medical History:  Diagnosis Date  . Anemia of chronic renal failure   . Chronic renal insufficiency   . Degenerative joint disease   . HTN (hypertension)   . Hyperlipidemia   . Shingles   . Vitamin D deficiency    Family history- Reviewed and unchanged Social history- Reviewed and unchanged   Review of Systems:  Review of Systems   Constitutional: Negative for malaise/fatigue and weight loss.  HENT: Negative for hearing loss and tinnitus.   Eyes: Negative for blurred vision and double vision.  Respiratory: Negative for cough, shortness of breath and wheezing.   Cardiovascular: Negative for chest pain, palpitations, orthopnea, claudication and leg swelling.  Gastrointestinal: Negative for abdominal pain, blood in stool, constipation, diarrhea, heartburn, melena, nausea and vomiting.  Genitourinary: Negative.   Musculoskeletal: Negative for joint pain and myalgias.  Skin: Negative for rash.  Neurological: Negative for dizziness, tingling, sensory change, weakness and headaches.  Endo/Heme/Allergies: Negative for polydipsia.  Psychiatric/Behavioral: Negative.   All other systems reviewed and are negative.     Physical Exam: BP 112/60   Pulse 67   Temp 97.6 F (36.4 C)   Ht 5\' 2"  (1.575 m)   Wt 115 lb (52.2 kg)   SpO2 98%   BMI 21.03 kg/m  Wt Readings from Last 3 Encounters:  09/03/17 115 lb (52.2 kg)  06/03/17 114 lb 6.4 oz (51.9 kg)  05/21/17 114 lb (51.7 kg)   General Appearance: Well nourished, in no apparent distress. Eyes: PERRLA, EOMs, conjunctiva no swelling or erythema Sinuses: No Frontal/maxillary tenderness ENT/Mouth: Ext aud canals clear, TMs without erythema, bulging. No erythema, swelling, or exudate on post pharynx.  Tonsils not swollen or erythematous. Hearing normal.  Neck: Supple, thyroid normal.  Respiratory: Respiratory effort normal, BS equal bilaterally without rales, rhonchi, wheezing or stridor.  Cardio: RRR with no MRGs. Brisk peripheral pulses without edema.  Abdomen: Soft, + BS.  Non tender, no guarding, rebound, hernias, masses. Lymphatics: Non tender without lymphadenopathy.  Musculoskeletal: Full ROM, 5/5 strength, Normal gait Skin: Warm, dry without rashes, lesions, ecchymosis.  Neuro: Cranial nerves intact. No cerebellar symptoms.  Psych: Awake and oriented X 3, normal  affect, Insight and Judgment appropriate.    Dan Maker, NP 9:41 AM [facility]

## 2017-09-03 ENCOUNTER — Encounter: Payer: Self-pay | Admitting: Adult Health

## 2017-09-03 ENCOUNTER — Ambulatory Visit (INDEPENDENT_AMBULATORY_CARE_PROVIDER_SITE_OTHER): Payer: PPO | Admitting: Adult Health

## 2017-09-03 VITALS — BP 112/60 | HR 67 | Temp 97.6°F | Ht 62.0 in | Wt 115.0 lb

## 2017-09-03 DIAGNOSIS — E785 Hyperlipidemia, unspecified: Secondary | ICD-10-CM | POA: Diagnosis not present

## 2017-09-03 DIAGNOSIS — Z6820 Body mass index (BMI) 20.0-20.9, adult: Secondary | ICD-10-CM | POA: Diagnosis not present

## 2017-09-03 DIAGNOSIS — Z79899 Other long term (current) drug therapy: Secondary | ICD-10-CM | POA: Diagnosis not present

## 2017-09-03 DIAGNOSIS — I10 Essential (primary) hypertension: Secondary | ICD-10-CM

## 2017-09-03 DIAGNOSIS — N183 Chronic kidney disease, stage 3 unspecified: Secondary | ICD-10-CM

## 2017-09-03 DIAGNOSIS — D649 Anemia, unspecified: Secondary | ICD-10-CM | POA: Diagnosis not present

## 2017-09-03 DIAGNOSIS — R7303 Prediabetes: Secondary | ICD-10-CM

## 2017-09-03 DIAGNOSIS — E559 Vitamin D deficiency, unspecified: Secondary | ICD-10-CM | POA: Diagnosis not present

## 2017-09-03 NOTE — Patient Instructions (Addendum)
Please call us this afternoon with current dose of simvastatin   Aim for 7+ servings of fruits and vegetables daily  80+ fluid ounces of water or unsweet tea for healthy kidneys  Limit alcohol intake  Limit animal fats in diet for cholesterol and heart health - choose grass fed whenever available  Aim for low stress - take time to unwind and care for your mental health  Aim for 150 min of moderate intensity exercise weekly for heart health, and weights twice weekly for bone health  Aim for 7-9 hours of sleep daily      When it comes to diets, agreement about the perfect plan isn't easy to find, even among the experts. Experts at the [employer] developed an idea known as the Healthy Eating Plate. Just imagine a plate divided into logical, healthy portions.  The emphasis is on diet quality:  Load up on vegetables and fruits - one-half of your plate: Aim for color and variety, and remember that potatoes don't count.  Go for whole grains - one-quarter of your plate: Whole wheat, barley, wheat berries, quinoa, oats, brown rice, and foods made with them. If you want pasta, go with whole wheat pasta.  Protein power - one-quarter of your plate: Fish, chicken, beans, and nuts are all healthy, versatile protein sources. Limit red meat.  The diet, however, does go beyond the plate, offering a few other suggestions.  Use healthy plant oils, such as olive, canola, soy, corn, sunflower and peanut. Check the labels, and avoid partially hydrogenated oil, which have unhealthy trans fats.  If you're thirsty, drink water. Coffee and tea are good in moderation, but skip sugary drinks and limit milk and dairy products to one or two daily servings.  The type of carbohydrate in the diet is more important than the amount. Some sources of carbohydrates, such as vegetables, fruits, whole grains, and beans-are healthier than others.  Finally, stay active.

## 2017-09-04 LAB — COMPLETE METABOLIC PANEL WITH GFR
AG RATIO: 2 (calc) (ref 1.0–2.5)
ALKALINE PHOSPHATASE (APISO): 60 U/L (ref 33–130)
ALT: 11 U/L (ref 6–29)
AST: 17 U/L (ref 10–35)
Albumin: 4.7 g/dL (ref 3.6–5.1)
BILIRUBIN TOTAL: 0.5 mg/dL (ref 0.2–1.2)
BUN / CREAT RATIO: 25 (calc) — AB (ref 6–22)
BUN: 28 mg/dL — ABNORMAL HIGH (ref 7–25)
CHLORIDE: 102 mmol/L (ref 98–110)
CO2: 28 mmol/L (ref 20–32)
Calcium: 9.8 mg/dL (ref 8.6–10.4)
Creat: 1.1 mg/dL — ABNORMAL HIGH (ref 0.60–0.88)
GFR, Est African American: 53 mL/min/{1.73_m2} — ABNORMAL LOW (ref >=60)
GFR, Est Non African American: 46 mL/min/{1.73_m2} — ABNORMAL LOW (ref >=60)
GLUCOSE: 101 mg/dL — AB (ref 65–99)
Globulin: 2.4 g/dL (calc) (ref 1.9–3.7)
POTASSIUM: 5 mmol/L (ref 3.5–5.3)
Sodium: 140 mmol/L (ref 135–146)
TOTAL PROTEIN: 7.1 g/dL (ref 6.1–8.1)

## 2017-09-04 LAB — TEST AUTHORIZATION

## 2017-09-04 LAB — CBC WITH DIFFERENTIAL/PLATELET
BASOS PCT: 0.7 %
Basophils Absolute: 32 cells/uL (ref 0–200)
EOS PCT: 1.1 %
Eosinophils Absolute: 51 cells/uL (ref 15–500)
HEMATOCRIT: 32.5 % — AB (ref 35.0–45.0)
HEMOGLOBIN: 10.5 g/dL — AB (ref 11.7–15.5)
LYMPHS ABS: 1440 {cells}/uL (ref 850–3900)
MCH: 27.5 pg (ref 27.0–33.0)
MCHC: 32.3 g/dL (ref 32.0–36.0)
MCV: 85.1 fL (ref 80.0–100.0)
MPV: 12 fL (ref 7.5–12.5)
Monocytes Relative: 6.7 %
NEUTROS ABS: 2769 {cells}/uL (ref 1500–7800)
Neutrophils Relative %: 60.2 %
Platelets: 196 10*3/uL (ref 140–400)
RBC: 3.82 10*6/uL (ref 3.80–5.10)
RDW: 13 % (ref 11.0–15.0)
Total Lymphocyte: 31.3 %
WBC: 4.6 10*3/uL (ref 3.8–10.8)
WBCMIX: 308 {cells}/uL (ref 200–950)

## 2017-09-04 LAB — TSH: TSH: 1.89 m[IU]/L (ref 0.40–4.50)

## 2017-09-04 LAB — LIPID PANEL
CHOLESTEROL: 207 mg/dL — AB (ref <200)
HDL: 59 mg/dL (ref >50)
LDL Cholesterol (Calc): 129 mg/dL (calc) — ABNORMAL HIGH
Non-HDL Cholesterol (Calc): 148 mg/dL (calc) — ABNORMAL HIGH (ref <130)
Total CHOL/HDL Ratio: 3.5 (calc) (ref <5.0)
Triglycerides: 90 mg/dL (ref <150)

## 2017-09-04 LAB — HEMOGLOBIN A1C
EAG (MMOL/L): 6.3 (calc)
Hgb A1c MFr Bld: 5.6 % of total Hgb (ref <5.7)
MEAN PLASMA GLUCOSE: 114 (calc)

## 2017-09-04 LAB — IRON,TIBC AND FERRITIN PANEL
%SAT: 24 % (ref 11–50)
FERRITIN: 153 ng/mL (ref 20–288)
IRON: 79 ug/dL (ref 45–160)
TIBC: 328 mcg/dL (calc) (ref 250–450)

## 2017-09-08 DIAGNOSIS — H353132 Nonexudative age-related macular degeneration, bilateral, intermediate dry stage: Secondary | ICD-10-CM | POA: Diagnosis not present

## 2017-09-08 DIAGNOSIS — H43813 Vitreous degeneration, bilateral: Secondary | ICD-10-CM | POA: Diagnosis not present

## 2017-09-08 DIAGNOSIS — H3563 Retinal hemorrhage, bilateral: Secondary | ICD-10-CM | POA: Diagnosis not present

## 2017-09-08 DIAGNOSIS — H35033 Hypertensive retinopathy, bilateral: Secondary | ICD-10-CM | POA: Diagnosis not present

## 2017-09-08 LAB — HM DIABETES EYE EXAM

## 2017-09-11 ENCOUNTER — Encounter: Payer: Self-pay | Admitting: *Deleted

## 2017-09-21 ENCOUNTER — Ambulatory Visit (INDEPENDENT_AMBULATORY_CARE_PROVIDER_SITE_OTHER): Payer: PPO | Admitting: *Deleted

## 2017-09-21 VITALS — BP 136/54 | HR 64

## 2017-09-21 DIAGNOSIS — N183 Chronic kidney disease, stage 3 unspecified: Secondary | ICD-10-CM

## 2017-09-21 DIAGNOSIS — D631 Anemia in chronic kidney disease: Secondary | ICD-10-CM

## 2017-09-21 DIAGNOSIS — E538 Deficiency of other specified B group vitamins: Secondary | ICD-10-CM

## 2017-09-21 NOTE — Progress Notes (Signed)
Patient is here for a NV to recheck her CBC with diff and also Vitamin B12 level, Folate and Retic Count . A hemoccult card was given to the patient, which she did not commit to return, due to her age.  She states she feels weel and is working in her yard. She had no complaints.

## 2017-09-22 LAB — CBC WITH DIFFERENTIAL/PLATELET
BASOS ABS: 29 {cells}/uL (ref 0–200)
Basophils Relative: 0.6 %
Eosinophils Absolute: 49 cells/uL (ref 15–500)
Eosinophils Relative: 1 %
HEMATOCRIT: 34 % — AB (ref 35.0–45.0)
Hemoglobin: 10.9 g/dL — ABNORMAL LOW (ref 11.7–15.5)
LYMPHS ABS: 1686 {cells}/uL (ref 850–3900)
MCH: 27.7 pg (ref 27.0–33.0)
MCHC: 32.1 g/dL (ref 32.0–36.0)
MCV: 86.3 fL (ref 80.0–100.0)
MPV: 11.9 fL (ref 7.5–12.5)
Monocytes Relative: 8.9 %
NEUTROS PCT: 55.1 %
Neutro Abs: 2700 cells/uL (ref 1500–7800)
PLATELETS: 202 10*3/uL (ref 140–400)
RBC: 3.94 10*6/uL (ref 3.80–5.10)
RDW: 12.8 % (ref 11.0–15.0)
TOTAL LYMPHOCYTE: 34.4 %
WBC mixed population: 436 cells/uL (ref 200–950)
WBC: 4.9 10*3/uL (ref 3.8–10.8)

## 2017-09-22 LAB — VITAMIN B12: Vitamin B-12: 325 pg/mL (ref 200–1100)

## 2017-09-22 LAB — FOLATE RBC: RBC FOLATE: 778 ng/mL (ref >280)

## 2017-09-22 LAB — RETICULOCYTES
ABS Retic: 42570 cells/uL (ref 20000–8000)
RETIC CT PCT: 1.1 %

## 2017-10-18 ENCOUNTER — Other Ambulatory Visit: Payer: Self-pay | Admitting: Physician Assistant

## 2017-11-13 ENCOUNTER — Encounter: Payer: Self-pay | Admitting: Internal Medicine

## 2017-11-16 ENCOUNTER — Encounter: Payer: Self-pay | Admitting: Internal Medicine

## 2017-12-03 ENCOUNTER — Other Ambulatory Visit: Payer: Self-pay | Admitting: Internal Medicine

## 2017-12-03 DIAGNOSIS — I10 Essential (primary) hypertension: Secondary | ICD-10-CM

## 2017-12-03 MED ORDER — AMLODIPINE BESYLATE 5 MG PO TABS: ORAL_TABLET | ORAL | 1 refills | Status: DC

## 2017-12-06 ENCOUNTER — Encounter: Payer: Self-pay | Admitting: Internal Medicine

## 2017-12-06 DIAGNOSIS — Z8249 Family history of ischemic heart disease and other diseases of the circulatory system: Secondary | ICD-10-CM | POA: Insufficient documentation

## 2017-12-06 DIAGNOSIS — E538 Deficiency of other specified B group vitamins: Secondary | ICD-10-CM | POA: Insufficient documentation

## 2017-12-06 DIAGNOSIS — Z136 Encounter for screening for cardiovascular disorders: Secondary | ICD-10-CM | POA: Insufficient documentation

## 2017-12-06 NOTE — Progress Notes (Signed)
ADULT & ADOLESCENT INTERNAL MEDICINE Ann [name], M.D.     Dyanne Carrel. Steffanie Dunn, P.A.-C Judd Gaudier, DNP [facility] [address] [place], [place]. [address] Telephone [phone] Telefax [phone] Annual Screening/Preventative Visit & Comprehensive Evaluation &  Examination     This very nice 84 y.o. WWF presents for a Screening /Preventative Visit & comprehensive evaluation and management of multiple medical co-morbidities.  Patient has been followed for HTN, HLD, Prediabetes  and Vitamin D Deficiency.      HTN predates circa 1997. Patient's BP has been controlled at home and patient denies any cardiac symptoms as chest pain, palpitations, shortness of breath, dizziness or ankle swelling. Patient has HT CKD3 (GFR 46).  Today's BP was initially sl elevated and rechecked at goal - 136/74.      Patient's hyperlipidemia is not controlled with diet.  Last lipids were not at goal: Lab Results  Component Value Date   CHOL 207 (H) 09/03/2017   HDL 59 09/03/2017   LDLCALC 129 (H) 09/03/2017   TRIG 90 09/03/2017   CHOLHDL 3.5 09/03/2017      Patient has hx/o prediabetes (A1c 6.1%/2010 & 5.7%/2016)  and patient denies reactive hypoglycemic symptoms, visual blurring, diabetic polys, or paresthesias. Last A1c was Normal & at goal: Lab Results  Component Value Date   HGBA1C 5.6 09/03/2017      Finally, patient has history of Vitamin D Deficiency ("25"/2008)  and last Vitamin D was at goal: Lab Results  Component Value Date   VD25OH 66 05/21/2017   Current Outpatient Medications on File Prior to Visit  Medication Sig  . amLODipine (NORVASC) 5 MG tablet Take 1 tablet daily  for BP  . aspirin 81 MG tablet Take 81 mg by mouth daily.  . Biotin 2500 MCG CAPS Take by mouth daily.  . Cholecalciferol (VITAMIN D3) 2000 UNITS TABS Take by mouth. Taking 4000 units daily  . CINNAMON PO Take 2,000 mg by mouth daily.  . fish oil-omega-3 fatty acids  1000 MG capsule Take 2 g by mouth daily. 1400 mg  . OVER THE COUNTER MEDICATION Takes vitamin for her eyes 1 daily  . simvastatin (ZOCOR) 20 MG tablet TAKE ONE TABLET BY MOUTH EVERY EVENING  . telmisartan (MICARDIS) 80 MG tablet Take 1 tablet daily for BP  . TURMERIC PO Take 1,000 mg by mouth 2 (two) times daily.  . hydrochlorothiazide (HYDRODIURIL) 12.5 MG tablet Take 1 tablet every morning for BP & Fluid   No current facility-administered medications on file prior to visit.    Allergies  Allergen Reactions  . Ace Inhibitors     Hyperkalemia    Past Medical History:  Diagnosis Date  . Anemia of chronic renal failure   . Chronic renal insufficiency   . Degenerative joint disease   . HTN (hypertension)   . Hyperlipidemia   . Shingles   . Vitamin D deficiency    Health Maintenance  Topic Date Due  . INFLUENZA VACCINE  10/29/2017  . TETANUS/TDAP  10/24/2026  . DEXA SCAN  Completed  . PNA vac Low Risk Adult  Completed   Immunization History  Administered Date(s) Administered  . Influenza Split 01/17/2013  . Influenza, High Dose Seasonal PF 01/02/2014, 01/16/2015, 01/02/2016  . Pneumococcal Conjugate-13 06/08/2015  . Pneumococcal Polysaccharide-23 06/04/2010  . Pneumococcal-Unspecified 01/31/1999  . Td 04/01/1995, 12/05/2005, 10/23/2016   Last Colon -  07/2004 - Dr Lina Sar  Last MGM - 10/15/2015 - encouraged to schedule MGM  Last dexaBMD - 10/15/2015  History reviewed. No pertinent surgical history.   Family History  Problem Relation Age of Onset  . CVA Father   . Cancer Daughter        breast   Social History   Tobacco Use  . Smoking status: Never Smoker  . Smokeless tobacco: Never Used  Substance Use Topics  . Alcohol use: Yes    Alcohol/week: 1.0 standard drinks    Types: 1 Glasses of wine per week    Comment: occasional wine  . Drug use: No    ROS Constitutional: Denies fever, chills, weight loss/gain, headaches, insomnia,  night sweats, and change  in appetite. Does c/o fatigue. Eyes: Denies redness, blurred vision, diplopia, discharge, itchy, watery eyes.  ENT: Denies discharge, congestion, post nasal drip, epistaxis, sore throat, earache, hearing loss, dental pain, Tinnitus, Vertigo, Sinus pain, snoring.  Cardio: Denies chest pain, palpitations, irregular heartbeat, syncope, dyspnea, diaphoresis, orthopnea, PND, claudication, edema Respiratory: denies cough, dyspnea, DOE, pleurisy, hoarseness, laryngitis, wheezing.  Gastrointestinal: Denies dysphagia, heartburn, reflux, water brash, pain, cramps, nausea, vomiting, bloating, diarrhea, constipation, hematemesis, melena, hematochezia, jaundice, hemorrhoids Genitourinary: Denies dysuria, frequency, urgency, nocturia, hesitancy, discharge, hematuria, flank pain Breast: Breast lumps, nipple discharge, bleeding.  Musculoskeletal: Denies arthralgia, myalgia, stiffness, Jt. Swelling, pain, limp, and strain/sprain. Denies falls. Skin: Denies puritis, rash, hives, warts, acne, eczema, changing in skin lesion Neuro: No weakness, tremor, incoordination, spasms, paresthesia, pain Psychiatric: Denies confusion, memory loss, sensory loss. Denies Depression. Endocrine: Denies change in weight, skin, hair change, nocturia, and paresthesia, diabetic polys, visual blurring, hyper / hypo glycemic episodes.  Heme/Lymph: No excessive bleeding, bruising, enlarged lymph nodes.  Physical Exam  BP 136/74   Pulse 64   Temp (!) 97.1 F (36.2 C)   Resp 16   Ht 5\' 2"  (1.575 m)   Wt 116 lb 3.2 oz (52.7 kg)   LMP  (Exact Date)   BMI 21.25 kg/m   General Appearance: Well nourished, well groomed and in no apparent distress.  Eyes: PERRLA, EOMs, conjunctiva no swelling or erythema, normal fundi and vessels. Sinuses: No frontal/maxillary tenderness ENT/Mouth: EACs patent / TMs  nl. Nares clear without erythema, swelling, mucoid exudates. Oral hygiene is good. No erythema, swelling, or exudate. Tongue normal,  non-obstructing. Tonsils not swollen or erythematous. Hearing normal.  Neck: Supple, thyroid not palpable. No bruits, nodes or JVD. Respiratory: Respiratory effort normal.  BS equal and clear bilateral without rales, rhonci, wheezing or stridor. Cardio: Heart sounds are normal with regular rate and rhythm and no murmurs, rubs or gallops. Peripheral pulses are normal and equal bilaterally without edema. No aortic or femoral bruits. Chest: symmetric with normal excursions and percussion. Breasts: Symmetric, without lumps, nipple discharge, retractions, or fibrocystic changes.  Abdomen: Flat, soft with bowel sounds active. Nontender, no guarding, rebound, hernias, masses, or organomegaly.  Lymphatics: Non tender without lymphadenopathy.  Genitourinary:  Musculoskeletal: Full ROM all peripheral extremities, joint stability, 5/5 strength, and normal gait. Skin: Warm and dry without rashes, lesions, cyanosis, clubbing or  ecchymosis.  Neuro: Cranial nerves intact, reflexes equal bilaterally. Normal muscle tone, no cerebellar symptoms. Sensation intact.  Pysch: Alert and oriented X 3, normal affect, Insight and Judgment appropriate.   Assessment and Plan  1. Annual Preventative Screening Examination  2. Essential hypertension  - EKG 12-Lead - Urinalysis, Routine w reflex microscopic - Microalbumin / creatinine urine ratio - CBC with Differential/Platelet - COMPLETE METABOLIC PANEL WITH GFR - Magnesium  3. Hyperlipidemia, mixed  - EKG 12-Lead -  TSH  4. Abnormal glucose  - EKG 12-Lead - Hemoglobin A1c - Insulin, random  5. Vitamin D deficiency  - VITAMIN D 25 Hydroxyl  6. Prediabetes  - EKG 12-Lead - Hemoglobin A1c - Insulin, random  7. CKD Stage III  (GFR 49 ml/min)  - Urinalysis, Routine w reflex microscopic - Microalbumin / creatinine urine ratio  8. Anemia of chronic renal failure, stage 3 (moderate) (HCC)  - CBC with Differential/Platelet  9. Vitamin B12  deficiency  - Vitamin B12 - CBC with Differential/Platelet  10. Screening for colorectal cancer  - POC Hemoccult Bld/Stl  11. Family history of cerebrovascular event  - EKG 12-Lead  12. Screening for ischemic heart disease  - EKG 12-Lead  13. Medication management  - Urinalysis, Routine w reflex microscopic - Microalbumin / creatinine urine ratio - Vitamin B12 - CBC with Differential/Platelet - COMPLETE METABOLIC PANEL WITH GFR - Magnesium - TSH - Hemoglobin A1c - Insulin, random - VITAMIN D 25 Hydroxyl       Patient was counseled in prudent diet to achieve/maintain BMI less than 25 for weight control, BP monitoring, regular exercise and medications. Discussed med's effects and SE's. Screening labs and tests as requested with regular follow-up as recommended. Over 40 minutes of exam, counseling, chart review and high complex critical decision making was performed.

## 2017-12-06 NOTE — Patient Instructions (Signed)

## 2017-12-07 ENCOUNTER — Ambulatory Visit (INDEPENDENT_AMBULATORY_CARE_PROVIDER_SITE_OTHER): Payer: PPO | Admitting: Internal Medicine

## 2017-12-07 ENCOUNTER — Encounter: Payer: Self-pay | Admitting: Internal Medicine

## 2017-12-07 VITALS — BP 136/74 | HR 64 | Temp 97.1°F | Resp 16 | Ht 62.0 in | Wt 116.2 lb

## 2017-12-07 DIAGNOSIS — Z136 Encounter for screening for cardiovascular disorders: Secondary | ICD-10-CM

## 2017-12-07 DIAGNOSIS — D631 Anemia in chronic kidney disease: Secondary | ICD-10-CM

## 2017-12-07 DIAGNOSIS — E559 Vitamin D deficiency, unspecified: Secondary | ICD-10-CM

## 2017-12-07 DIAGNOSIS — Z1211 Encounter for screening for malignant neoplasm of colon: Secondary | ICD-10-CM

## 2017-12-07 DIAGNOSIS — Z0001 Encounter for general adult medical examination with abnormal findings: Secondary | ICD-10-CM

## 2017-12-07 DIAGNOSIS — R7303 Prediabetes: Secondary | ICD-10-CM

## 2017-12-07 DIAGNOSIS — E782 Mixed hyperlipidemia: Secondary | ICD-10-CM | POA: Diagnosis not present

## 2017-12-07 DIAGNOSIS — I10 Essential (primary) hypertension: Secondary | ICD-10-CM | POA: Diagnosis not present

## 2017-12-07 DIAGNOSIS — Z8249 Family history of ischemic heart disease and other diseases of the circulatory system: Secondary | ICD-10-CM

## 2017-12-07 DIAGNOSIS — Z0000 Encounter for general adult medical examination without abnormal findings: Secondary | ICD-10-CM

## 2017-12-07 DIAGNOSIS — N183 Chronic kidney disease, stage 3 unspecified: Secondary | ICD-10-CM

## 2017-12-07 DIAGNOSIS — E538 Deficiency of other specified B group vitamins: Secondary | ICD-10-CM

## 2017-12-07 DIAGNOSIS — Z79899 Other long term (current) drug therapy: Secondary | ICD-10-CM

## 2017-12-07 DIAGNOSIS — Z1212 Encounter for screening for malignant neoplasm of rectum: Secondary | ICD-10-CM

## 2017-12-07 DIAGNOSIS — R7309 Other abnormal glucose: Secondary | ICD-10-CM | POA: Diagnosis not present

## 2017-12-08 LAB — COMPLETE METABOLIC PANEL WITH GFR
AG Ratio: 2.1 (calc) (ref 1.0–2.5)
ALKALINE PHOSPHATASE (APISO): 55 U/L (ref 33–130)
ALT: 10 U/L (ref 6–29)
AST: 17 U/L (ref 10–35)
Albumin: 4.8 g/dL (ref 3.6–5.1)
BUN / CREAT RATIO: 19 (calc) (ref 6–22)
BUN: 23 mg/dL (ref 7–25)
CALCIUM: 10.3 mg/dL (ref 8.6–10.4)
CO2: 26 mmol/L (ref 20–32)
Chloride: 101 mmol/L (ref 98–110)
Creat: 1.18 mg/dL — ABNORMAL HIGH (ref 0.60–0.88)
GFR, EST AFRICAN AMERICAN: 49 mL/min/{1.73_m2} — AB (ref >=60)
GFR, Est Non African American: 42 mL/min/{1.73_m2} — ABNORMAL LOW (ref >=60)
Globulin: 2.3 g/dL (calc) (ref 1.9–3.7)
Glucose, Bld: 96 mg/dL (ref 65–99)
POTASSIUM: 5.3 mmol/L (ref 3.5–5.3)
Sodium: 137 mmol/L (ref 135–146)
Total Bilirubin: 0.5 mg/dL (ref 0.2–1.2)
Total Protein: 7.1 g/dL (ref 6.1–8.1)

## 2017-12-08 LAB — CBC WITH DIFFERENTIAL/PLATELET
Basophils Absolute: 18 cells/uL (ref 0–200)
Basophils Relative: 0.4 %
EOS PCT: 0.9 %
Eosinophils Absolute: 41 cells/uL (ref 15–500)
HCT: 33.1 % — ABNORMAL LOW (ref 35.0–45.0)
Hemoglobin: 10.7 g/dL — ABNORMAL LOW (ref 11.7–15.5)
Lymphs Abs: 1352 cells/uL (ref 850–3900)
MCH: 27.6 pg (ref 27.0–33.0)
MCHC: 32.3 g/dL (ref 32.0–36.0)
MCV: 85.3 fL (ref 80.0–100.0)
MONOS PCT: 8.1 %
MPV: 12.4 fL (ref 7.5–12.5)
NEUTROS PCT: 61.2 %
Neutro Abs: 2815 cells/uL (ref 1500–7800)
PLATELETS: 203 10*3/uL (ref 140–400)
RBC: 3.88 10*6/uL (ref 3.80–5.10)
RDW: 13.5 % (ref 11.0–15.0)
TOTAL LYMPHOCYTE: 29.4 %
WBC mixed population: 373 cells/uL (ref 200–950)
WBC: 4.6 10*3/uL (ref 3.8–10.8)

## 2017-12-08 LAB — URINALYSIS, ROUTINE W REFLEX MICROSCOPIC
BILIRUBIN URINE: NEGATIVE
Bacteria, UA: NONE SEEN /HPF
Glucose, UA: NEGATIVE
Hgb urine dipstick: NEGATIVE
Hyaline Cast: NONE SEEN /LPF
KETONES UR: NEGATIVE
NITRITE: NEGATIVE
Protein, ur: NEGATIVE
RBC / HPF: NONE SEEN /HPF (ref 0–2)
SPECIFIC GRAVITY, URINE: 1.016 (ref 1.001–1.03)
SQUAMOUS EPITHELIAL / LPF: NONE SEEN /HPF (ref <=5)
WBC, UA: NONE SEEN /HPF (ref 0–5)
pH: <=5 (ref 5.0–8.0)

## 2017-12-08 LAB — VITAMIN B12: Vitamin B-12: >2000 pg/mL — ABNORMAL HIGH (ref 200–1100)

## 2017-12-08 LAB — MICROALBUMIN / CREATININE URINE RATIO
CREATININE, URINE: 113 mg/dL (ref 20–275)
Microalb Creat Ratio: 5 mcg/mg creat (ref <30)
Microalb, Ur: 0.6 mg/dL

## 2017-12-08 LAB — HEMOGLOBIN A1C
Hgb A1c MFr Bld: 5.9 % of total Hgb — ABNORMAL HIGH (ref <5.7)
Mean Plasma Glucose: 123 (calc)
eAG (mmol/L): 6.8 (calc)

## 2017-12-08 LAB — MAGNESIUM: MAGNESIUM: 1.9 mg/dL (ref 1.5–2.5)

## 2017-12-08 LAB — TSH: TSH: 1.63 m[IU]/L (ref 0.40–4.50)

## 2017-12-08 LAB — INSULIN, RANDOM: Insulin: 4.9 u[IU]/mL (ref 2.0–19.6)

## 2017-12-08 LAB — VITAMIN D 25 HYDROXY (VIT D DEFICIENCY, FRACTURES): Vit D, 25-Hydroxy: 48 ng/mL (ref 30–100)

## 2017-12-30 ENCOUNTER — Other Ambulatory Visit: Payer: Self-pay | Admitting: Internal Medicine

## 2018-01-19 ENCOUNTER — Ambulatory Visit (INDEPENDENT_AMBULATORY_CARE_PROVIDER_SITE_OTHER): Payer: PPO

## 2018-01-19 VITALS — Temp 97.8°F | Ht 62.0 in | Wt 121.2 lb

## 2018-01-19 DIAGNOSIS — Z23 Encounter for immunization: Secondary | ICD-10-CM | POA: Diagnosis not present

## 2018-01-19 NOTE — Progress Notes (Signed)
Pt presents for INFLUENZA (HD). Given in Rt arm w/o issues at that time. TEMP:97.8

## 2018-01-26 ENCOUNTER — Other Ambulatory Visit: Payer: Self-pay | Admitting: Internal Medicine

## 2018-01-26 DIAGNOSIS — Z1231 Encounter for screening mammogram for malignant neoplasm of breast: Secondary | ICD-10-CM

## 2018-03-12 NOTE — Progress Notes (Signed)
FOLLOW UP  Assessment and Plan:   Hypertension Well controlled with current medications  Monitor blood pressure at home; patient to call if consistently greater than 130/80 Continue DASH diet.   Reminder to go to the ER if any CP, SOB, nausea, dizziness, severe HA, changes vision/speech, left arm numbness and tingling and jaw pain.  Cholesterol Currently above goal; not treated aggressively by medication due to age Continue to encourage low cholesterol diet and exercise.  Check lipid panel.   Prediabetes Continue diet and exercise.  Perform daily foot/skin check, notify office of any concerning changes.  Check A1C  CKD 3 Increase fluids, avoid NSAIDS, monitor sugars, will monitor  Vitamin D Def Below goal at last visit; continue supplementation to maintain goal of 70-100 Check Vit D level  Continue diet and meds as discussed. Further disposition pending results of labs. Discussed med's effects and SE's.   Over 30 minutes of exam, counseling, chart review, and critical decision making was performed.   Future Appointments  Date Time Provider Department Center  03/22/2018 10:20 AM [facility] 2 [facility]  06/21/2018 10:30 AM Lucky CowboyMcKeown, William, MD [facility] None  01/05/2019 10:00 AM Lucky CowboyMcKeown, William, MD [facility] None    ----------------------------------------------------------------------------------------------------------------------  HPI 84 y.o. female  presents for 3 month follow up on hypertension, cholesterol, prediabetes, weight and vitamin D deficiency.   BMI is Body mass index is 21.58 kg/m., she has been working on diet and exercise, does strength exercises daily, does yard work, stationary bike some days [employer] Readings from Last 3 Encounters:  03/15/18 118 lb (53.5 kg)  01/19/18 121 lb 3.2 oz (55 kg)  12/07/17 116 lb 3.2 oz (52.7 kg)   Her blood pressure has been controlled at home, today their BP is BP: 132/62  She does workout. She denies chest  pain, shortness of breath, dizziness.   She is on cholesterol medication, simvastatin 20 mg daily, also on omega 3. Her cholesterol is not at goal. The cholesterol last visit was:   Lab Results  Component Value Date   CHOL 207 (H) 09/03/2017   HDL 59 09/03/2017   LDLCALC 129 (H) 09/03/2017   TRIG 90 09/03/2017   CHOLHDL 3.5 09/03/2017    She has been working on diet and exercise for prediabetes, and denies foot ulcerations, increased appetite, nausea, paresthesia of the feet, polydipsia, polyuria, visual disturbances, vomiting and weight loss. Last A1C in the office was:  Lab Results  Component Value Date   HGBA1C 5.9 (H) 12/07/2017    She has CKD 3 and consequent anemia of chronic disease:  Lab Results  Component Value Date   GFRNONAA 42 (L) 12/07/2017   Lab Results  Component Value Date   WBC 4.6 12/07/2017   HGB 10.7 (L) 12/07/2017   HCT 33.1 (L) 12/07/2017   MCV 85.3 12/07/2017   PLT 203 12/07/2017   Patient is on Vitamin D supplement and near goal at recent check:   Lab Results  Component Value Date   VD25OH 48 12/07/2017        Current Medications:  Current Outpatient Medications on File Prior to Visit  Medication Sig  . amLODipine (NORVASC) 5 MG tablet Take 1 tablet daily  for BP  . aspirin 81 MG tablet Take 81 mg by mouth daily.  . Biotin 2500 MCG CAPS Take by mouth daily.  . Cholecalciferol (VITAMIN D3) 2000 UNITS TABS Take by mouth. Taking 4000 units daily  . CINNAMON PO Take 2,000 mg by mouth daily.  .Marland Kitchen  fish oil-omega-3 fatty acids 1000 MG capsule Take 2 g by mouth daily. 1400 mg  . hydrochlorothiazide (HYDRODIURIL) 25 MG tablet Take 1 tablet daily for BP & Fluid  . OVER THE COUNTER MEDICATION Takes vitamin for her eyes 1 daily  . simvastatin (ZOCOR) 20 MG tablet TAKE ONE TABLET BY MOUTH EVERY EVENING  . telmisartan (MICARDIS) 80 MG tablet Take 1 tablet daily for BP  . TURMERIC PO Take 1,000 mg by mouth 2 (two) times daily.   No current  facility-administered medications on file prior to visit.      Allergies:  Allergies  Allergen Reactions  . Ace Inhibitors     Hyperkalemia      Medical History:  Past Medical History:  Diagnosis Date  . Anemia of chronic renal failure   . Chronic renal insufficiency   . Degenerative joint disease   . HTN (hypertension)   . Hyperlipidemia   . Shingles   . Vitamin D deficiency    Family history- Reviewed and unchanged Social history- Reviewed and unchanged   Review of Systems:  Review of Systems  Constitutional: Negative for malaise/fatigue and weight loss.  HENT: Negative for hearing loss and tinnitus.   Eyes: Negative for blurred vision and double vision.  Respiratory: Negative for cough, shortness of breath and wheezing.   Cardiovascular: Negative for chest pain, palpitations, orthopnea, claudication and leg swelling.  Gastrointestinal: Negative for abdominal pain, blood in stool, constipation, diarrhea, heartburn, melena, nausea and vomiting.  Genitourinary: Negative.   Musculoskeletal: Negative for joint pain and myalgias.  Skin: Negative for rash.  Neurological: Negative for dizziness, tingling, sensory change, weakness and headaches.  Endo/Heme/Allergies: Negative for polydipsia.  Psychiatric/Behavioral: Negative.   All other systems reviewed and are negative.     Physical Exam: BP 132/62   Pulse (!) 59   Temp (!) 97.2 F (36.2 C)   Ht 5\' 2"  (1.575 m)   Wt 118 lb (53.5 kg)   SpO2 98%   BMI 21.58 kg/m  Wt Readings from Last 3 Encounters:  03/15/18 118 lb (53.5 kg)  01/19/18 121 lb 3.2 oz (55 kg)  12/07/17 116 lb 3.2 oz (52.7 kg)   General Appearance: Well nourished, in no apparent distress. Eyes: PERRLA, EOMs, conjunctiva no swelling or erythema Sinuses: No Frontal/maxillary tenderness ENT/Mouth: Ext aud canals clear, TMs without erythema, bulging. No erythema, swelling, or exudate on post pharynx.  Tonsils not swollen or erythematous. Hearing  normal.  Neck: Supple, thyroid normal.  Respiratory: Respiratory effort normal, BS equal bilaterally without rales, rhonchi, wheezing or stridor.  Cardio: RRR with no MRGs. Brisk peripheral pulses without edema.  Abdomen: Soft, + BS.  Non tender, no guarding, rebound, hernias, masses. Lymphatics: Non tender without lymphadenopathy.  Musculoskeletal: Full ROM, 5/5 strength, Normal gait Skin: Warm, dry without rashes, lesions, ecchymosis.  Neuro: Cranial nerves intact. No cerebellar symptoms.  Psych: Awake and oriented X 3, normal affect, Insight and Judgment appropriate.    Dan Maker, NP 11:22 AM [facility]

## 2018-03-15 ENCOUNTER — Ambulatory Visit (INDEPENDENT_AMBULATORY_CARE_PROVIDER_SITE_OTHER): Payer: PPO | Admitting: Adult Health

## 2018-03-15 ENCOUNTER — Encounter: Payer: Self-pay | Admitting: Adult Health

## 2018-03-15 VITALS — BP 132/62 | HR 59 | Temp 97.2°F | Ht 62.0 in | Wt 118.0 lb

## 2018-03-15 DIAGNOSIS — I10 Essential (primary) hypertension: Secondary | ICD-10-CM

## 2018-03-15 DIAGNOSIS — D631 Anemia in chronic kidney disease: Secondary | ICD-10-CM

## 2018-03-15 DIAGNOSIS — E782 Mixed hyperlipidemia: Secondary | ICD-10-CM

## 2018-03-15 DIAGNOSIS — N183 Chronic kidney disease, stage 3 unspecified: Secondary | ICD-10-CM

## 2018-03-15 DIAGNOSIS — Z79899 Other long term (current) drug therapy: Secondary | ICD-10-CM

## 2018-03-15 DIAGNOSIS — E559 Vitamin D deficiency, unspecified: Secondary | ICD-10-CM | POA: Diagnosis not present

## 2018-03-15 DIAGNOSIS — Z6820 Body mass index (BMI) 20.0-20.9, adult: Secondary | ICD-10-CM | POA: Diagnosis not present

## 2018-03-15 DIAGNOSIS — R7303 Prediabetes: Secondary | ICD-10-CM | POA: Diagnosis not present

## 2018-03-15 NOTE — Patient Instructions (Addendum)
Goals    . HEMOGLOBIN A1C < 5.7    . LDL CALC < 130      Know what a healthy weight is for you (roughly BMI <25) and aim to maintain this  Aim for 7+ servings of fruits and vegetables daily  65-80+ fluid ounces of water or unsweet tea for healthy kidneys  Limit to max 1 drink of alcohol per day; avoid smoking/tobacco  Limit animal fats in diet for cholesterol and heart health - choose grass fed whenever available  Avoid highly processed foods, and foods high in saturated/trans fats  Aim for low stress - take time to unwind and care for your mental health  Aim for 150 min of moderate intensity exercise weekly for heart health, and weights twice weekly for bone health  Aim for 7-9 hours of sleep daily        When it comes to diets, agreement about the perfect plan isn't easy to find, even among the experts. Experts at the [employer] developed an idea known as the Healthy Eating Plate. Just imagine a plate divided into logical, healthy portions.  The emphasis is on diet quality:  Load up on vegetables and fruits - one-half of your plate: Aim for color and variety, and remember that potatoes don't count.  Go for whole grains - one-quarter of your plate: Whole wheat, barley, wheat berries, quinoa, oats, brown rice, and foods made with them. If you want pasta, go with whole wheat pasta.  Protein power - one-quarter of your plate: Fish, chicken, beans, and nuts are all healthy, versatile protein sources. Limit red meat.  The diet, however, does go beyond the plate, offering a few other suggestions.  Use healthy plant oils, such as olive, canola, soy, corn, sunflower and peanut. Check the labels, and avoid partially hydrogenated oil, which have unhealthy trans fats.  If you're thirsty, drink water. Coffee and tea are good in moderation, but skip sugary drinks and limit milk and dairy products to one or two daily servings.  The type of carbohydrate in the  diet is more important than the amount. Some sources of carbohydrates, such as vegetables, fruits, whole grains, and beans-are healthier than others.  Finally, stay active.

## 2018-03-16 ENCOUNTER — Other Ambulatory Visit: Payer: Self-pay | Admitting: Adult Health

## 2018-03-16 DIAGNOSIS — E875 Hyperkalemia: Secondary | ICD-10-CM

## 2018-03-16 LAB — COMPLETE METABOLIC PANEL WITH GFR
AG RATIO: 1.9 (calc) (ref 1.0–2.5)
ALKALINE PHOSPHATASE (APISO): 57 U/L (ref 33–130)
ALT: 8 U/L (ref 6–29)
AST: 16 U/L (ref 10–35)
Albumin: 4.8 g/dL (ref 3.6–5.1)
BUN/Creatinine Ratio: 26 (calc) — ABNORMAL HIGH (ref 6–22)
BUN: 32 mg/dL — ABNORMAL HIGH (ref 7–25)
CHLORIDE: 100 mmol/L (ref 98–110)
CO2: 24 mmol/L (ref 20–32)
Calcium: 10.2 mg/dL (ref 8.6–10.4)
Creat: 1.22 mg/dL — ABNORMAL HIGH (ref 0.60–0.88)
GFR, Est African American: 47 mL/min/{1.73_m2} — ABNORMAL LOW (ref >=60)
GFR, Est Non African American: 40 mL/min/{1.73_m2} — ABNORMAL LOW (ref >=60)
GLOBULIN: 2.5 g/dL (ref 1.9–3.7)
Glucose, Bld: 106 mg/dL — ABNORMAL HIGH (ref 65–99)
POTASSIUM: 5.7 mmol/L — AB (ref 3.5–5.3)
SODIUM: 137 mmol/L (ref 135–146)
Total Bilirubin: 0.4 mg/dL (ref 0.2–1.2)
Total Protein: 7.3 g/dL (ref 6.1–8.1)

## 2018-03-16 LAB — VITAMIN D 25 HYDROXY (VIT D DEFICIENCY, FRACTURES): VIT D 25 HYDROXY: 48 ng/mL (ref 30–100)

## 2018-03-16 LAB — CBC WITH DIFFERENTIAL/PLATELET
ABSOLUTE MONOCYTES: 407 {cells}/uL (ref 200–950)
BASOS ABS: 29 {cells}/uL (ref 0–200)
Basophils Relative: 0.6 %
EOS PCT: 0.8 %
Eosinophils Absolute: 39 cells/uL (ref 15–500)
HEMATOCRIT: 33.7 % — AB (ref 35.0–45.0)
Hemoglobin: 11 g/dL — ABNORMAL LOW (ref 11.7–15.5)
LYMPHS ABS: 1245 {cells}/uL (ref 850–3900)
MCH: 27.9 pg (ref 27.0–33.0)
MCHC: 32.6 g/dL (ref 32.0–36.0)
MCV: 85.5 fL (ref 80.0–100.0)
MPV: 11.9 fL (ref 7.5–12.5)
Monocytes Relative: 8.3 %
NEUTROS PCT: 64.9 %
Neutro Abs: 3180 cells/uL (ref 1500–7800)
PLATELETS: 208 10*3/uL (ref 140–400)
RBC: 3.94 10*6/uL (ref 3.80–5.10)
RDW: 13.1 % (ref 11.0–15.0)
TOTAL LYMPHOCYTE: 25.4 %
WBC: 4.9 10*3/uL (ref 3.8–10.8)

## 2018-03-16 LAB — LIPID PANEL
CHOLESTEROL: 222 mg/dL — AB (ref <200)
HDL: 67 mg/dL (ref >50)
LDL Cholesterol (Calc): 134 mg/dL (calc) — ABNORMAL HIGH
NON-HDL CHOLESTEROL (CALC): 155 mg/dL — AB (ref <130)
TRIGLYCERIDES: 100 mg/dL (ref <150)
Total CHOL/HDL Ratio: 3.3 (calc) (ref <5.0)

## 2018-03-16 LAB — TSH: TSH: 1.77 mIU/L (ref 0.40–4.50)

## 2018-03-16 LAB — HEMOGLOBIN A1C
EAG (MMOL/L): 6.6 (calc)
Hgb A1c MFr Bld: 5.8 % of total Hgb — ABNORMAL HIGH (ref <5.7)
MEAN PLASMA GLUCOSE: 120 (calc)

## 2018-03-16 MED ORDER — VITAMIN D3 50 MCG (2000 UT) PO TABS: ORAL_TABLET | ORAL | Status: AC

## 2018-03-22 ENCOUNTER — Ambulatory Visit
Admission: RE | Admit: 2018-03-22 | Discharge: 2018-03-22 | Disposition: A | Discharge status: Home / Self Care | Payer: PPO | Source: Ambulatory Visit | Attending: Internal Medicine | Admitting: Internal Medicine

## 2018-03-22 DIAGNOSIS — Z1231 Encounter for screening mammogram for malignant neoplasm of breast: Secondary | ICD-10-CM

## 2018-04-02 ENCOUNTER — Ambulatory Visit (INDEPENDENT_AMBULATORY_CARE_PROVIDER_SITE_OTHER): Payer: PPO | Admitting: *Deleted

## 2018-04-02 DIAGNOSIS — E875 Hyperkalemia: Secondary | ICD-10-CM

## 2018-04-02 LAB — BASIC METABOLIC PANEL WITH GFR
BUN/Creatinine Ratio: 21 (calc) (ref 6–22)
BUN: 28 mg/dL — AB (ref 7–25)
CHLORIDE: 100 mmol/L (ref 98–110)
CO2: 26 mmol/L (ref 20–32)
Calcium: 9.9 mg/dL (ref 8.6–10.4)
Creat: 1.33 mg/dL — ABNORMAL HIGH (ref 0.60–0.88)
GFR, EST AFRICAN AMERICAN: 42 mL/min/{1.73_m2} — AB (ref >=60)
GFR, Est Non African American: 36 mL/min/{1.73_m2} — ABNORMAL LOW (ref >=60)
Glucose, Bld: 99 mg/dL (ref 65–99)
Potassium: 4.8 mmol/L (ref 3.5–5.3)
Sodium: 136 mmol/L (ref 135–146)

## 2018-04-02 NOTE — Progress Notes (Signed)
Patient is here for a NV to recheck a BMET due to elevated Potassium and decreased kidney function.  Patient states she did not increase her fluid intake, but drinks about 32 ounces of water plus other drinks in a day.

## 2018-04-16 NOTE — Progress Notes (Signed)
Patient ID: Ann [name], female   DOB: [birth year], 85 y.o.   MRN: [id number]  MEDICARE ANNUAL WELLNESS VISIT AND FOLLOW UP  Assessment:   Encounter for annual medicare visit  Essential hypertension Well controlled with telmisartan 20 mg daily - remains at goal after d/c of hctz - continue medications, DASH diet, exercise and monitor at home. Call if greater than 130/80.  -     telmisartan (MICARDIS) 20 MG tablet; Take 1 tablet daily for BP  Osteoporosis, unspecified osteoporosis type, unspecified pathological fracture presence Monitor Declines DEXA today, wouldn't do bisphosphonates Discussed high calcium diet, vitamin D, weight bearing   CKD Stage III  (GFR 36 ml/min) Increase fluids, avoid NSAIDS, monitor sugars, will monitor closely She prefers to avoid nephrology referral unless absolutely necessary   Hyperlipidemia, unspecified hyperlipidemia type -continue medications, check lipids at , decrease fatty foods, increase activity.   Medication management Monitor  Other abnormal glucose Monitor  Vitamin D deficiency Continue supplement  Primary osteoarthritis involving multiple joints Tylenol, avoid NSAIDs -     diclofenac sodium (VOLTAREN) 1 % GEL; Apply 4 g topically 4 (four) times daily.   Over 30 minutes of exam, counseling, chart review, and critical decision making was performed  Plan:   During the course of the visit the patient was educated and counseled about appropriate screening and preventive services including:    Pneumococcal vaccine   Influenza vaccine  Td vaccine  Prevnar 13  Screening electrocardiogram  Screening mammography  Bone densitometry screening  Colorectal cancer screening  Diabetes screening  Glaucoma screening  Nutrition counseling   Advanced directives: given info/requested copies   Subjective:   Ann [name] is a 85 y.o. female who presents for Medicare Annual Wellness Visit and follow up on renal  functions after HCTZ was discontinued in light of creatinine trending up and GFRs trending down   Lab Results  Component Value Date   GFRNONAA 36 (L) 04/02/2018   GFRNONAA 40 (L) 03/15/2018   GFRNONAA 42 (L) 12/07/2017    Lab Results  Component Value Date   CREATININE 1.33 (H) 04/02/2018   CREATININE 1.22 (H) 03/15/2018   CREATININE 1.18 (H) 12/07/2017   Her blood pressure has NOT been controlled at home, today their BP is BP: 124/62  She currently takes telmisartan 20 mg daily and does well with this. No changes since d/cing hctz, no dyspnea, cough, edema.  She does workout. She denies chest pain, shortness of breath, dizziness.   BMI is Body mass index is 21.91 kg/m., she has been working on diet and exercise. Wt Readings from Last 3 Encounters:  04/19/18 119 lb 12.8 oz (54.3 kg)  04/02/18 118 lb 6.4 oz (53.7 kg)  03/15/18 118 lb (53.5 kg)   She reports chronic L shoulder pain; saw ortho and was dx with arthritis 20 years ago. She takes tylenol with minimal benefit. She is a poor candidate for NSAIDs or opioids and is interested in topical therapy.    Medication Review Current Outpatient Medications on File Prior to Visit  Medication Sig Dispense Refill  . amLODipine (NORVASC) 5 MG tablet Take 1 tablet daily  for BP 90 tablet 1  . aspirin 81 MG tablet Take 81 mg by mouth daily.    . Biotin 2500 MCG CAPS Take by mouth daily.    . Cholecalciferol (VITAMIN D3) 50 MCG (2000 UT) TABS Taking 6000 units daily 30 tablet   . CINNAMON PO Take 2,000 mg by mouth daily.    Marland Kitchen  fish oil-omega-3 fatty acids 1000 MG capsule Take 2 g by mouth daily. 1400 mg    . OVER THE COUNTER MEDICATION Takes vitamin for her eyes 1 daily    . simvastatin (ZOCOR) 20 MG tablet TAKE ONE TABLET BY MOUTH EVERY EVENING 90 tablet 1  . telmisartan (MICARDIS) 80 MG tablet Take 1 tablet daily for BP 90 tablet 1  . TURMERIC PO Take 1,000 mg by mouth 2 (two) times daily.    . hydrochlorothiazide (HYDRODIURIL) 25 MG  tablet Take 1 tablet daily for BP & Fluid (Patient not taking: Reported on 04/19/2018) 90 tablet 1   No current facility-administered medications on file prior to visit.     Current Problems (verified) Patient Active Problem List   Diagnosis Date Noted  . Vitamin B12 deficiency 12/06/2017  . Osteoporosis 10/15/2015  . Body mass index (BMI) of 20.0-20.9 in adult 03/09/2015  . Prediabetes 01/01/2014  . Medication management 06/27/2013  . Essential hypertension   . Hyperlipidemia, mixed   . Vitamin D deficiency   . Degenerative joint disease   . CKD Stage III  (GFR 49 ml/min)   . Anemia of chronic renal failure, stage 3 (moderate) (HCC)     Screening Tests Immunization History  Administered Date(s) Administered  . Influenza Split 01/17/2013  . Influenza, High Dose Seasonal PF 01/02/2014, 01/16/2015, 01/02/2016, 01/19/2018  . Pneumococcal Conjugate-13 06/08/2015  . Pneumococcal Polysaccharide-23 06/04/2010  . Pneumococcal-Unspecified 01/31/1999  . Td 04/01/1995, 12/05/2005, 10/23/2016    Preventative care: Last colonoscopy:  2006 declines another due to age Last mammogram: 02/2018 DEXA: 09/2015  Osteoporosis - declines bisphosphonates Declines to order today, will discuss at next OV  Prior vaccinations: TD or Tdap: 2018  Influenza: 2019  Pneumococcal: 2012 Prevnar13: 2013 Shingles/Zostavax: Declined  Names of Other Physician/Practitioners you currently use: 1. [facility]- here for primary care 2. Dr. Elmer Picker, eye doctor, last visit last visit 2019, has scheduled next week 3. Dr. Autumn Messing, dentist, last visit 2020, goes q43m  Patient Care Team: Lucky Cowboy, MD as PCP - General (Internal Medicine)  Allergies Allergies  Allergen Reactions  . Ace Inhibitors     Hyperkalemia     SURGICAL HISTORY She  has no past surgical history on file. FAMILY HISTORY Her family history includes CVA in her father; Cancer in her  daughter. SOCIAL HISTORY She  reports that she has never smoked. She has never used smokeless tobacco. She reports current alcohol use of about 1.0 standard drinks of alcohol per week. She reports that she does not use drugs.  MEDICARE WELLNESS OBJECTIVES: Physical activity: Current Exercise Habits: Home exercise routine, Type of exercise: strength training/weights;treadmill, Time (Minutes): 20, Frequency (Times/Week): 7, Weekly Exercise (Minutes/Week): 140, Intensity: Mild, Exercise limited by: None identified Cardiac risk factors: Cardiac Risk Factors include: advanced age (>33men, >46 women);hypertension;dyslipidemia Depression/mood screen:   Depression screen [facility] 2/9 04/19/2018  Decreased Interest 0  Down, Depressed, Hopeless 0  PHQ - 2 Score 0    ADLs:  In your present state of health, do you have any difficulty performing the following activities: 04/19/2018 12/06/2017  Hearing? N N  Vision? N N  Difficulty concentrating or making decisions? N N  Walking or climbing stairs? N N  Dressing or bathing? N N  Doing errands, shopping? N N  Some recent data might be hidden     Cognitive Testing  Alert? Yes  Normal Appearance?Yes  Oriented to person? Yes  Place? Yes   Time? Yes  Recall of three objects?  Yes  Can perform simple calculations? Yes  Displays appropriate judgment?Yes  Can read the correct time from a watch face?Yes  EOL planning: Does Patient Have a Medical Advance Directive?: Yes Type of Advance Directive: Healthcare Power of Attorney, Living will Does patient want to make changes to medical advance directive?: No - Patient declined Copy of Healthcare Power of Attorney in Chart?: No - copy requested   Objective:   Today's Vitals   04/19/18 0935  BP: 124/62  Pulse: (!) 58  Temp: 97.7 F (36.5 C)  SpO2: 98%  Weight: 119 lb 12.8 oz (54.3 kg)  Height: 5\' 2"  (1.575 m)   Body mass index is 21.91 kg/m.  General appearance: alert, no distress, WD/WN,   female HEENT: normocephalic, sclerae anicteric, TMs pearly, nares patent, no discharge or erythema, pharynx normal Oral cavity: MMM, no lesions Neck: supple, no lymphadenopathy, no thyromegaly, no masses Heart: RRR, normal S1, S2, no murmurs Lungs: CTA bilaterally, no wheezes, rhonchi, or rales Abdomen: +bs, soft, non tender, non distended, no masses, no hepatomegaly, no splenomegaly Musculoskeletal: nontender, no swelling, no obvious deformity. She has limited ROM to L shoulder, abduction past 90 degrees is limited (per patient ongoing for multiple years, declined surgery) Extremities: no edema, no cyanosis, no clubbing Pulses: 2+ symmetric, upper and lower extremities, normal cap refill Neurological: alert, oriented x 3, CN2-12 intact, strength normal upper extremities and lower extremities, sensation normal throughout, DTRs 2+ throughout, no cerebellar signs, gait normal Psychiatric: normal affect, behavior normal, pleasant  Breast: defer Gyn: defer Rectal: defer   Medicare Attestation I have personally reviewed: The patient's medical and social history Their use of alcohol, tobacco or illicit drugs Their current medications and supplements The patient's functional ability including ADLs,fall risks, home safety risks, cognitive, and hearing and visual impairment Diet and physical activities Evidence for depression or mood disorders  The patient's weight, height, BMI, and visual acuity have been recorded in the chart.  I have made referrals, counseling, and provided education to the patient based on review of the above and I have provided the patient with a written personalized care plan for preventive services.     Dan MakerAshley C Dennis Hegeman, NP   04/19/2018

## 2018-04-19 ENCOUNTER — Encounter: Payer: Self-pay | Admitting: Adult Health

## 2018-04-19 ENCOUNTER — Ambulatory Visit (INDEPENDENT_AMBULATORY_CARE_PROVIDER_SITE_OTHER): Payer: PPO | Admitting: Adult Health

## 2018-04-19 VITALS — BP 124/62 | HR 58 | Temp 97.7°F | Ht 62.0 in | Wt 119.8 lb

## 2018-04-19 DIAGNOSIS — R7303 Prediabetes: Secondary | ICD-10-CM

## 2018-04-19 DIAGNOSIS — I10 Essential (primary) hypertension: Secondary | ICD-10-CM

## 2018-04-19 DIAGNOSIS — M810 Age-related osteoporosis without current pathological fracture: Secondary | ICD-10-CM | POA: Diagnosis not present

## 2018-04-19 DIAGNOSIS — Z6820 Body mass index (BMI) 20.0-20.9, adult: Secondary | ICD-10-CM

## 2018-04-19 DIAGNOSIS — Z0000 Encounter for general adult medical examination without abnormal findings: Secondary | ICD-10-CM

## 2018-04-19 DIAGNOSIS — E782 Mixed hyperlipidemia: Secondary | ICD-10-CM | POA: Diagnosis not present

## 2018-04-19 DIAGNOSIS — E559 Vitamin D deficiency, unspecified: Secondary | ICD-10-CM

## 2018-04-19 DIAGNOSIS — E538 Deficiency of other specified B group vitamins: Secondary | ICD-10-CM | POA: Diagnosis not present

## 2018-04-19 DIAGNOSIS — Z79899 Other long term (current) drug therapy: Secondary | ICD-10-CM

## 2018-04-19 DIAGNOSIS — D631 Anemia in chronic kidney disease: Secondary | ICD-10-CM

## 2018-04-19 DIAGNOSIS — M1990 Unspecified osteoarthritis, unspecified site: Secondary | ICD-10-CM | POA: Diagnosis not present

## 2018-04-19 DIAGNOSIS — N183 Chronic kidney disease, stage 3 unspecified: Secondary | ICD-10-CM

## 2018-04-19 DIAGNOSIS — M150 Primary generalized (osteo)arthritis: Secondary | ICD-10-CM | POA: Diagnosis not present

## 2018-04-19 DIAGNOSIS — R6889 Other general symptoms and signs: Secondary | ICD-10-CM

## 2018-04-19 DIAGNOSIS — Z0001 Encounter for general adult medical examination with abnormal findings: Secondary | ICD-10-CM | POA: Diagnosis not present

## 2018-04-19 DIAGNOSIS — M159 Polyosteoarthritis, unspecified: Secondary | ICD-10-CM

## 2018-04-19 LAB — BASIC METABOLIC PANEL WITH GFR
BUN/Creatinine Ratio: 18 (calc) (ref 6–22)
BUN: 18 mg/dL (ref 7–25)
CHLORIDE: 105 mmol/L (ref 98–110)
CO2: 25 mmol/L (ref 20–32)
Calcium: 10 mg/dL (ref 8.6–10.4)
Creat: 1.01 mg/dL — ABNORMAL HIGH (ref 0.60–0.88)
GFR, Est African American: 59 mL/min/{1.73_m2} — ABNORMAL LOW (ref >=60)
GFR, Est Non African American: 51 mL/min/{1.73_m2} — ABNORMAL LOW (ref >=60)
Glucose, Bld: 99 mg/dL (ref 65–99)
Potassium: 5.1 mmol/L (ref 3.5–5.3)
Sodium: 141 mmol/L (ref 135–146)

## 2018-04-19 LAB — CBC WITH DIFFERENTIAL/PLATELET
Absolute Monocytes: 439 cells/uL (ref 200–950)
Basophils Absolute: 41 cells/uL (ref 0–200)
Basophils Relative: 0.8 %
Eosinophils Absolute: 92 cells/uL (ref 15–500)
Eosinophils Relative: 1.8 %
HCT: 33 % — ABNORMAL LOW (ref 35.0–45.0)
Hemoglobin: 10.7 g/dL — ABNORMAL LOW (ref 11.7–15.5)
Lymphs Abs: 1545 cells/uL (ref 850–3900)
MCH: 27.6 pg (ref 27.0–33.0)
MCHC: 32.4 g/dL (ref 32.0–36.0)
MCV: 85.1 fL (ref 80.0–100.0)
MONOS PCT: 8.6 %
MPV: 11.6 fL (ref 7.5–12.5)
NEUTROS PCT: 58.5 %
Neutro Abs: 2984 cells/uL (ref 1500–7800)
PLATELETS: 203 10*3/uL (ref 140–400)
RBC: 3.88 10*6/uL (ref 3.80–5.10)
RDW: 13.3 % (ref 11.0–15.0)
Total Lymphocyte: 30.3 %
WBC: 5.1 10*3/uL (ref 3.8–10.8)

## 2018-04-19 MED ORDER — DICLOFENAC SODIUM 1 % TD GEL: 4.0000 g | Freq: Four times a day (QID) | TRANSDERMAL | 3 refills | Status: DC

## 2018-04-19 MED ORDER — TELMISARTAN 20 MG PO TABS: ORAL_TABLET | ORAL | 1 refills | Status: DC

## 2018-04-19 NOTE — Patient Instructions (Addendum)
[name] , Thank you for taking time to come for your Medicare Wellness Visit. I appreciate your ongoing commitment to your health goals. Please review the following plan we discussed and let me know if I can assist you in the future.   These are the goals we discussed: Goals    . DIET - INCREASE WATER INTAKE     65+ fluid ounces (4-5 bottles of water)    . Exercise 150 min/wk Moderate Activity     Focus on weight bearing exercises for bone health - squats, jumping, etc.     . HEMOGLOBIN A1C < 5.7    . LDL CALC < 130       This is a list of the screening recommended for you and due dates:  Health Maintenance  Topic Date Due  . Tetanus Vaccine  10/24/2026  . Flu Shot  Completed  . DEXA scan (bone density measurement)  Completed  . Pneumonia vaccines  Completed       Preventing Osteoporosis, Adult Osteoporosis is a condition that causes the bones to get weaker. With osteoporosis, the bones become thinner, and the normal spaces in bone tissue become larger. This can make the bones weak and cause them to break more easily. People who have osteoporosis are more likely to break their wrist, spine, or hip. Even a minor accident or injury can be enough to break weak bones. Osteoporosis can occur with aging. Your body constantly replaces old bone tissue with new tissue. As you get older, you may lose bone tissue more quickly, or it may be replaced more slowly. Osteoporosis is more likely to develop if you have poor nutrition or do not get enough calcium or vitamin D. Other lifestyle factors can also play a role. By making some diet and lifestyle changes, you can help to keep your bones healthy and help to prevent osteoporosis. What nutrition changes can be made? Nutrition plays an important role in maintaining healthy, strong bones.  Make sure you get enough calcium every day from food or from calcium supplements. ? If you are age 83 or younger, aim to get 1,000 mg of calcium every  day. ? If you are older than age 83, aim to get 1,200 mg of calcium every day.  Try to get enough vitamin D every day. ? If you are age 83 or younger, aim to get 600 international units (IU) every day. ? If you are older than age 83, aim to get 800 international units every day.  Follow a healthy diet. Eat plenty of foods that contain calcium and vitamin D. ? Calcium is in milk, cheese, yogurt, and other dairy products. Some fish and vegetables are also good sources of calcium. Many foods such as cereals and breads have had calcium added to them (are fortified). Check nutrition labels to see how much calcium is in a food or drink. ? Foods that contain vitamin D include milk, cereals, salmon, and tuna. Your body also makes vitamin D when you are out in the sun. Bare skin exposure to the sun on your face, arms, legs, or back for no more than 30 minutes a day, 2 times per week is more than enough. Beyond that, it is important to use sunblock to protect your skin from sunburn, which increases your risk for skin cancer. What lifestyle changes can be made? Making changes in your everyday life can also play an important role in preventing osteoporosis.  Stay active and get exercise every day.  Ask your health care provider what types of exercise are best for you.  Do not use any products that contain nicotine or tobacco, such as cigarettes and e-cigarettes. If you need help quitting, ask your health care provider.  Limit alcohol intake to no more than 1 drink a day for nonpregnant women and 2 drinks a day for men. One drink equals 12 oz of beer, 5 oz of wine, or 1 oz of hard liquor. Why are these changes important? Making these nutrition and lifestyle changes can:  Help you develop and maintain healthy, strong bones.  Prevent loss of bone mass and the problems that are caused by that loss, such as broken bones and delayed healing.  Make you feel better mentally and physically. What can happen if  changes are not made? Problems that can result from osteoporosis can be very serious. These may include:  A higher risk of broken bones that are painful and do not heal well.  Physical malformations, such as a collapsed spine or a hunched back.  Problems with movement. Where to find support If you need help making changes to prevent osteoporosis, talk with your health care provider. You can ask for a referral to a diet and nutrition specialist (dietitian) and a physical therapist. Where to find more information Learn more about osteoporosis from:  NIH Osteoporosis and Related Bone Diseases National Resource Center: www.niams.[employer]usBr[employer].ch  U.S. Office on Women's Health: SodaWaters.hu.html  National Osteoporosis Foundation: NetsBook.it Summary  Osteoporosis is a condition that causes weak bones that are more likely to break.  Eating a healthy diet and making sure you get enough calcium and vitamin D can help prevent osteoporosis.  Other ways to reduce your risk of osteoporosis include getting regular exercise and avoiding alcohol and products that contain nicotine or tobacco. This information is not intended to replace advice given to you by your health care provider. Make sure you discuss any questions you have with your health care provider. Document Released: 04/01/2015 Document Revised: 12/23/2016 Document Reviewed: 11/26/2015 Elsevier Interactive Patient Education  2019 Elsevier Inc.     Preventing Chronic Kidney Disease Chronic kidney disease (CKD) occurs when the kidneys are damaged for at least 3 months and do not function effectively. The kidneys are two organs that do many important jobs in the body, including:  Removing waste and extra fluids from the blood.  Regulating hormones, blood pressure, and blood chemistry. At first, you may  not notice any signs or symptoms of CKD. However, CKD gets worse over time (is progressive). You can prevent CKD or keep CKD from progressing by making certain changes to your lifestyle and nutrition. Risk factorsfor CKD include:  Being age 83 or older.  Being female.  Having any of the following: ? Diabetes. ? High blood pressure. ? Heart disease. ? An autoimmune disease. ? Frequent urinary tract infections. ? Polycystic kidney disease. ? A family history of kidney disease, heart disease, diabetes, or high blood pressure.  Having problems with urine flow that may be caused by: ? Cancer. ? Having kidney stones more than once. ? An enlarged prostate in males.  Being of African-American, Native American, Hispanic, Asian, or Pacific Islander descent.  Being obese.  Long-term use of NSAIDs.  Current or former tobacco use. What types of nutrition changes can I make? A balanced meal plan can help keep your kidneys healthy and prevent CKD. A balanced meal plan may involve:  Limiting salt (sodium) intake. You should have less than 1 tsp (2,300  mg) of sodium per day. If you have heart disease or high blood pressure, you should have less than  tsp (1,500 mg) of sodium per day.  Limiting alcohol intake to no more than 1 drink a day for nonpregnant women and 2 drinks a day for men. One drink equals 12 oz of beer, 5 oz of wine, or 1 oz of hard liquor. If you have diabetes, work with a Dealernutrition specialist (Museum/gallery exhibitions officerregistered dietitian) or a certified diabetes educator to develop a healthy eating plan. What types of lifestyle changes can I make? Lifestyle changes that can help prevent CKD include:  Talking with your health care provider about how much fluid you should drink each day.  Working with your health care provider to manage your blood pressure. This includes healthy eating, regular exercise, and taking any medicines that are prescribed for you.  Exercisingfor at least 30 minutes on  five or more days of the week, or as much as told by your health care provider.  Keeping your weight at a healthy level. If you are overweight or obese, lose weight as told by your health care provider.  Not smoking or using any tobacco products, such as cigarettes, chewing tobacco, and e-cigarettes. If you need help quitting, ask your health care provider.  Having a yearly physical exam.  Learning about your family's medical history. Talk to your relatives and siblings about diabetes, heart disease, and high blood pressure.  Using NSAIDs for pain only when necessary. Ask your health care provider about other pain medicines that do not increase your risk of developing CKD. If you have diabetes, managing your diabetes with a healthy meal plan and physical activity can help prevent CKD. Work with your health care provider to manage your condition. What can happen if changes are not made? If you do not make lifestyle and nutrition changes to protect your kidneys, you may develop CKD, which can lead to:  A low red blood cell count (anemia).  Heart disease.  Weak bones.  Nerve damage (neuropathy).  Stroke.  Kidney failure and dialysis. What can I do to lower my risk? Talk to your health care provider about your kidney health and your risk factors for CKD. The most important steps to take to lower your risk of CKD are:  Getting high blood pressure down to the target that your health care provider recommends.  Getting blood sugar (glucose) levels down to the target that your health care provider recommends.  Eating less sodium. What are my treatment options for CKD? Treatment for CKD may include:  Medicines that: ? Control high blood pressure, such as ACE inhibitors. ? Control blood glucose levels. ? Control cholesterol levels. ? Help your body get rid of excess water (diuretics). ? Help protect your bones.  Lifestyle changes. You may need to: ? Eat less salt. ? Eat less  protein. ? Follow a heart-healthy meal plan. ? Quit smoking. ? Avoid phosphorus. Phosphorous is found in dark-colored sodas and canned ice teas. ? Avoid potassium. Potassium is found in some juices, especially orange juice. ? Avoid alcohol.  Dialysis. This is when a machine filters your blood if your kidney fails.  Kidney transplant, if your kidney fails. Where can I get more information? Learn more about CKD and how to prevent CKD from:  The National Kidney Foundation: www.kidney.org  The American Association of Kidney Patients: ResidentialShow.iswww.aakp.org  The American Diabetes Association: www.diabetes.org Summary You may not notice symptoms of CKD until your kidneys have already been  damaged. The best way to prevent kidney damage is to know your risk factors and make nutrition and lifestyle changes before you develop symptoms of CKD. This information is not intended to replace advice given to you by your health care provider. Make sure you discuss any questions you have with your health care provider. Document Released: 04/13/2015 Document Revised: 11/28/2015 Document Reviewed: 02/12/2015 Elsevier Interactive Patient Education  2019 [employer].

## 2018-04-28 DIAGNOSIS — H35033 Hypertensive retinopathy, bilateral: Secondary | ICD-10-CM | POA: Diagnosis not present

## 2018-04-28 DIAGNOSIS — H43813 Vitreous degeneration, bilateral: Secondary | ICD-10-CM | POA: Diagnosis not present

## 2018-04-28 DIAGNOSIS — H353132 Nonexudative age-related macular degeneration, bilateral, intermediate dry stage: Secondary | ICD-10-CM | POA: Diagnosis not present

## 2018-04-28 DIAGNOSIS — H3561 Retinal hemorrhage, right eye: Secondary | ICD-10-CM | POA: Diagnosis not present

## 2018-05-02 ENCOUNTER — Other Ambulatory Visit: Payer: Self-pay | Admitting: Physician Assistant

## 2018-05-02 DIAGNOSIS — I10 Essential (primary) hypertension: Secondary | ICD-10-CM

## 2018-05-18 ENCOUNTER — Other Ambulatory Visit: Payer: Self-pay | Admitting: Internal Medicine

## 2018-06-05 ENCOUNTER — Other Ambulatory Visit: Payer: Self-pay | Admitting: Internal Medicine

## 2018-06-05 MED ORDER — HYDROCHLOROTHIAZIDE 25 MG PO TABS: ORAL_TABLET | ORAL | 3 refills | Status: DC

## 2018-06-21 ENCOUNTER — Ambulatory Visit: Payer: Self-pay | Admitting: Internal Medicine

## 2018-07-16 DIAGNOSIS — H353132 Nonexudative age-related macular degeneration, bilateral, intermediate dry stage: Secondary | ICD-10-CM | POA: Diagnosis not present

## 2018-08-01 ENCOUNTER — Encounter: Payer: Self-pay | Admitting: Internal Medicine

## 2018-08-01 NOTE — Progress Notes (Signed)
THIS ENCOUNTER IS A VIRTUAL VISIT DUE TO COVID-19 - PATIENT WAS NOT SEEN IN THE OFFICE.  PATIENT HAS CONSENTED TO VIRTUAL VISIT / TELEMEDICINE VISIT  This provider placed a call to [employer] A [name] using telephone, her appointment was changed to a virtual office visit to reduce the risk of exposure to the COVID-19 virus and to help Ann [name] remain healthy and safe. The virtual visit will also provide continuity of care. She verbalizes understanding.   Virtual Visit via telephone Note  I connected with  Ann [name]  on 08/02/18  by telephone.  I verified that I am speaking with the correct person using two identifiers.        I discussed the limitations of evaluation and management by telemedicine and the availability of in person appointments. The patient expressed understanding and agreed to proceed.  History of Present Illness:      This very nice 85 y.o. DWF presents for 3 month follow up with HTN, HLD, Pre-Diabetes and Vitamin D Deficiency.       Patient is treated for HTN (1997) & BP has been controlled at home. Today's BP: 131/68.Patient hasCKD3 (GFR 46) consequent of her HTN.  Patient has had no complaints of any cardiac type chest pain, palpitations, dyspnea / orthopnea / PND, dizziness, claudication, or dependent edema.      Hyperlipidemia is controlled with diet & meds. Patient denies myalgias or other med SE's. Last Lipids were not at goal: Lab Results  Component Value Date   CHOL 222 (H) 03/15/2018   HDL 67 03/15/2018   LDLCALC 134 (H) 03/15/2018   TRIG 100 03/15/2018   CHOLHDL 3.3 03/15/2018       Also, the patient has history of PreDiabetes (A1c 6.1% / 2010 & 5.7% / 2016) and has had no symptoms of reactive hypoglycemia, diabetic polys, paresthesias or visual blurring.  Last A1c was not at goal: Lab Results  Component Value Date   HGBA1C 5.8 (H) 03/15/2018       Further, the patient also has history of Vitamin D Deficiency("25" / 2008)  and supplements  vitamin D without any suspected side-effects. Last vitamin D was still low (goal 70-100): Lab Results  Component Value Date   VD25OH 48 03/15/2018   COVID Screen     The patient does not symptoms concerning for COVID-19 infection such as CP, fever, chills, cough, loss of smell or taste or new SHORTNESS OF BREATH that suggest any further testing/ screening at this time.  Social distancing reinforced today.  Current Outpatient Medications on File Prior to Visit  Medication Sig  . amLODipine (NORVASC) 5 MG tablet Take 5 mg by mouth daily.  Marland Kitchen. aspirin 81 MG tablet Take 81 mg by mouth daily.  . Biotin 2500 MCG CAPS Take by mouth daily.  . Cholecalciferol (VITAMIN D3) 50 MCG (2000 UT) TABS Taking 6000 units daily  . CINNAMON PO Take 2,000 mg by mouth daily.  . Cyanocobalamin (VITAMIN B-12 PO) 1 tablet daily.  . fish oil-omega-3 fatty acids 1000 MG capsule Take 2 g by mouth daily. 1400 mg  . OVER THE COUNTER MEDICATION Takes vitamin for her eyes 1 daily  . simvastatin (ZOCOR) 20 MG tablet TAKE ONE TABLET BY MOUTH EVERY EVENING  . telmisartan (MICARDIS) 20 MG tablet Take 1 tablet daily for BP  . TURMERIC PO Take 1,000 mg by mouth 2 (two) times daily.  . hydrochlorothiazide (HYDRODIURIL) 25 MG tablet Take 1 tablet daily for BP &  Fluid (Patient not taking: Reported on 08/02/2018)   No current facility-administered medications on file prior to visit.    Allergies  Allergen Reactions  . Ace Inhibitors     Hyperkalemia    PMHx:   Past Medical History:  Diagnosis Date  . Anemia of chronic renal failure   . Chronic renal insufficiency   . Degenerative joint disease   . HTN (hypertension)   . Hyperlipidemia   . Shingles   . Vitamin D deficiency    Immunization History  Administered Date(s) Administered  . Influenza Split 01/17/2013  . Influenza, High Dose Seasonal PF 01/02/2014, 01/16/2015, 01/02/2016, 01/19/2018  . Pneumococcal Conjugate-13 06/08/2015  . Pneumococcal Polysaccharide-23  06/04/2010  . Pneumococcal-Unspecified 01/31/1999  . Td 04/01/1995, 12/05/2005, 10/23/2016   FHx:    Reviewed / unchanged  SHx:    Reviewed / unchanged   Systems Review:  Constitutional: Denies fever, chills, wt changes, headaches, insomnia, fatigue, night sweats, change in appetite. Eyes: Denies redness, blurred vision, diplopia, discharge, itchy, watery eyes.  ENT: Denies discharge, congestion, post nasal drip, epistaxis, sore throat, earache, hearing loss, dental pain, tinnitus, vertigo, sinus pain, snoring.  CV: Denies chest pain, palpitations, irregular heartbeat, syncope, dyspnea, diaphoresis, orthopnea, PND, claudication or edema. Respiratory: denies cough, dyspnea, DOE, pleurisy, hoarseness, laryngitis, wheezing.  Gastrointestinal: Denies dysphagia, odynophagia, heartburn, reflux, water brash, abdominal pain or cramps, nausea, vomiting, bloating, diarrhea, constipation, hematemesis, melena, hematochezia  or hemorrhoids. Genitourinary: Denies dysuria, frequency, urgency, nocturia, hesitancy, discharge, hematuria or flank pain. Musculoskeletal: Denies arthralgias, myalgias, stiffness, jt. swelling, pain, limping or strain/sprain.  Skin: Denies pruritus, rash, hives, warts, acne, eczema or change in skin lesion(s). Neuro: No weakness, tremor, incoordination, spasms, paresthesia or pain. Psychiatric: Denies confusion, memory loss or sensory loss. Endo: Denies change in weight, skin or hair change.  Heme/Lymph: No excessive bleeding, bruising or enlarged lymph nodes.  Physical Exam  BP 131/68   Pulse (!) 58   Wt 118 lb 4.8 oz (53.7 kg)   BMI 21.64 kg/m   Appears  well nourished, well groomed  and in no distress.  Eyes: PERRLA, EOMs, conjunctiva no swelling or erythema. Sinuses: No frontal/maxillary tenderness ENT/Mouth: EAC's clear, TM's nl w/o erythema, bulging. Nares clear w/o erythema, swelling, exudates. Oropharynx clear without erythema or exudates. Oral hygiene is good.  Tongue normal, non obstructing. Hearing intact.  Neck: Supple. Thyroid not palpable. Car 2+/2+ without bruits, nodes or JVD. Chest: Respirations nl with BS clear & equal w/o rales, rhonchi, wheezing or stridor.  Cor: Heart sounds normal w/ regular rate and rhythm without sig. murmurs, gallops, clicks or rubs. Peripheral pulses normal and equal  without edema.  Abdomen: Soft & bowel sounds normal. Non-tender w/o guarding, rebound, hernias, masses or organomegaly.  Lymphatics: Unremarkable.  Musculoskeletal: Full ROM all peripheral extremities, joint stability, 5/5 strength and normal gait.  Skin: Warm, dry without exposed rashes, lesions or ecchymosis apparent.  Neuro: Cranial nerves intact, reflexes equal bilaterally. Sensory-motor testing grossly intact. Tendon reflexes grossly intact.  Pysch: Alert & oriented x 3.  Insight and judgement nl & appropriate. No ideations.  Assessment and Plan:  1. Essential hypertension  - Continue medication, monitor blood pressure at home.  - Continue DASH diet.  Reminder to go to the ER if any CP,  SOB, nausea, dizziness, severe HA, changes vision/speech.  2. Hyperlipidemia, mixed  - Continue diet/meds, exercise,& lifestyle modifications.  - Continue monitor periodic cholesterol/liver & renal functions   3. Abnormal glucose  - Continue diet, exercise  -  Lifestyle modifications.  - Monitor appropriate labs.  4. Vitamin D deficiency  - Continue supplementation.  5. Prediabetes  - Continue diet, exercise  - Lifestyle modifications.  - Monitor appropriate labs.       Discussed  regular exercise, BP monitoring, weight control to achieve/maintain BMI less than 25 and discussed med and SE's. Recommended labs to assess and monitor clinical status with further disposition pending results of labs. Over 30 minutes of exam, counseling, chart review was performed.          I discussed the assessment and treatment plan with the patient. The patient was  provided an opportunity to ask questions and all were answered. The patient agreed with the plan and demonstrated an understanding of the instructions.        The patient was advised to call back or seek an in-person evaluation if the symptoms worsen or if the condition fails to improve as anticipated.       I provided  minutes of non-face-to-face time during this encounter and over 40 minutes of exam, counseling, chart review and  complex critical decision making was performed    Marinus Maw, MD

## 2018-08-01 NOTE — Patient Instructions (Signed)

## 2018-08-02 ENCOUNTER — Ambulatory Visit: Payer: Self-pay | Admitting: Internal Medicine

## 2018-08-02 ENCOUNTER — Encounter: Payer: Self-pay | Admitting: Internal Medicine

## 2018-08-02 ENCOUNTER — Ambulatory Visit: Payer: PPO | Admitting: Internal Medicine

## 2018-08-02 ENCOUNTER — Other Ambulatory Visit: Payer: Self-pay

## 2018-08-02 VITALS — BP 131/68 | HR 58 | Wt 118.3 lb

## 2018-08-02 DIAGNOSIS — R7303 Prediabetes: Secondary | ICD-10-CM | POA: Diagnosis not present

## 2018-08-02 DIAGNOSIS — E782 Mixed hyperlipidemia: Secondary | ICD-10-CM

## 2018-08-02 DIAGNOSIS — E559 Vitamin D deficiency, unspecified: Secondary | ICD-10-CM

## 2018-08-02 DIAGNOSIS — I10 Essential (primary) hypertension: Secondary | ICD-10-CM | POA: Diagnosis not present

## 2018-08-02 DIAGNOSIS — R7309 Other abnormal glucose: Secondary | ICD-10-CM

## 2018-08-13 ENCOUNTER — Other Ambulatory Visit: Payer: Self-pay | Admitting: Internal Medicine

## 2018-09-13 DIAGNOSIS — H353132 Nonexudative age-related macular degeneration, bilateral, intermediate dry stage: Secondary | ICD-10-CM | POA: Diagnosis not present

## 2018-10-13 DIAGNOSIS — H353132 Nonexudative age-related macular degeneration, bilateral, intermediate dry stage: Secondary | ICD-10-CM | POA: Diagnosis not present

## 2018-11-11 ENCOUNTER — Ambulatory Visit (INDEPENDENT_AMBULATORY_CARE_PROVIDER_SITE_OTHER): Payer: PPO | Admitting: Internal Medicine

## 2018-11-11 ENCOUNTER — Encounter: Payer: Self-pay | Admitting: Internal Medicine

## 2018-11-11 ENCOUNTER — Other Ambulatory Visit: Payer: Self-pay

## 2018-11-11 VITALS — BP 126/58 | HR 72 | Temp 97.6°F | Resp 16 | Ht 62.0 in | Wt 120.0 lb

## 2018-11-11 DIAGNOSIS — Z79899 Other long term (current) drug therapy: Secondary | ICD-10-CM | POA: Diagnosis not present

## 2018-11-11 DIAGNOSIS — R7303 Prediabetes: Secondary | ICD-10-CM | POA: Diagnosis not present

## 2018-11-11 DIAGNOSIS — E559 Vitamin D deficiency, unspecified: Secondary | ICD-10-CM | POA: Diagnosis not present

## 2018-11-11 DIAGNOSIS — E782 Mixed hyperlipidemia: Secondary | ICD-10-CM | POA: Diagnosis not present

## 2018-11-11 DIAGNOSIS — I10 Essential (primary) hypertension: Secondary | ICD-10-CM

## 2018-11-11 DIAGNOSIS — R7309 Other abnormal glucose: Secondary | ICD-10-CM

## 2018-11-11 NOTE — Progress Notes (Signed)
History of Present Illness:      This very nice 85 y.o. WWF presents for 3 month follow up with HTN, HLD, Pre-Diabetes and Vitamin D Deficiency.       Patient is treated for HTN (1997) & BP has been controlled at home. She has CKD3 consequent of her HTN. Today's BP is at goal - 126/58. Patient has had no complaints of any cardiac type chest pain, palpitations, dyspnea / orthopnea / PND, dizziness, claudication, or dependent edema.  Hyperlipidemia is controlled with diet & meds. Patient denies myalgias or other med SE's. Last Lipids were not at goal: Lab Results  Component Value Date   CHOL 222 (H) 03/15/2018   HDL 67 03/15/2018   LDLCALC 134 (H) 03/15/2018   TRIG 100 03/15/2018   CHOLHDL 3.3 03/15/2018       Also, the patient has history of PreDiabetes  (A1c 6.1% / 2010 then 5.7% / 2016) and has had no symptoms of reactive hypoglycemia, diabetic polys, paresthesias or visual blurring.  Last A1c was not at goal: Lab Results  Component Value Date   HGBA1C 5.9 (H) 11/11/2018       Further, the patient also has history of Vitamin D Deficiency ("25" / 2008)  and supplements vitamin D without any suspected side-effects. Last vitamin D was still low (goal 70-100): Lab Results  Component Value Date   VD25OH 48 03/15/2018   Current Outpatient Medications on File Prior to Visit  Medication Sig  . amLODipine (NORVASC) 5 MG tablet Take 5 mg by mouth daily.  Marland Kitchen. aspirin 81 MG tablet Take 81 mg by mouth daily.  . Biotin 2500 MCG CAPS Take by mouth daily.  . Cholecalciferol (VITAMIN D3) 50 MCG (2000 UT) TABS Taking 6000 units daily  . CINNAMON PO Take 2,000 mg by mouth daily.  . Cyanocobalamin (VITAMIN B-12 PO) 1 tablet daily.  . fish oil-omega-3 fatty acids 1000 MG capsule Take 2 g by mouth daily. 1400 mg  . hydrochlorothiazide (HYDRODIURIL) 25 MG tablet Take 1 tablet Daily for Fluid Retention  . olmesartan (BENICAR) 20 MG tablet Take 1 tablet Daily for BP  . OVER THE COUNTER MEDICATION  Takes vitamin for her eyes 1 daily  . simvastatin (ZOCOR) 20 MG tablet Take 1 tablet every Night for Cholestrol  . telmisartan (MICARDIS) 20 MG tablet Take 1 tablet daily for BP  . TURMERIC PO Take 1,000 mg by mouth 2 (two) times daily.   No current facility-administered medications on file prior to visit.    Allergies  Allergen Reactions  . Ace Inhibitors     Hyperkalemia    PMHx:   Past Medical History:  Diagnosis Date  . Anemia of chronic renal failure   . Chronic renal insufficiency   . Degenerative joint disease   . HTN (hypertension)   . Hyperlipidemia   . Shingles   . Vitamin D deficiency    Immunization History  Administered Date(s) Administered  . Influenza Split 01/17/2013  . Influenza, High Dose Seasonal PF 01/02/2014, 01/16/2015, 01/02/2016, 01/19/2018  . Pneumococcal Conjugate-13 06/08/2015  . Pneumococcal Polysaccharide-23 06/04/2010  . Pneumococcal-Unspecified 01/31/1999  . Td 04/01/1995, 12/05/2005, 10/23/2016   No past surgical history on file.  FHx:    Reviewed / unchanged  SHx:    Reviewed / unchanged   Systems Review:  Constitutional: Denies fever, chills, wt changes, headaches, insomnia, fatigue, night sweats, change in appetite. Eyes: Denies redness, blurred vision, diplopia, discharge, itchy, watery eyes.  ENT: Denies discharge, congestion, post nasal drip, epistaxis, sore throat, earache, hearing loss, dental pain, tinnitus, vertigo, sinus pain, snoring.  CV: Denies chest pain, palpitations, irregular heartbeat, syncope, dyspnea, diaphoresis, orthopnea, PND, claudication or edema. Respiratory: denies cough, dyspnea, DOE, pleurisy, hoarseness, laryngitis, wheezing.  Gastrointestinal: Denies dysphagia, odynophagia, heartburn, reflux, water brash, abdominal pain or cramps, nausea, vomiting, bloating, diarrhea, constipation, hematemesis, melena, hematochezia  or hemorrhoids. Genitourinary: Denies dysuria, frequency, urgency, nocturia, hesitancy,  discharge, hematuria or flank pain. Musculoskeletal: Denies arthralgias, myalgias, stiffness, jt. swelling, pain, limping or strain/sprain.  Skin: Denies pruritus, rash, hives, warts, acne, eczema or change in skin lesion(s). Neuro: No weakness, tremor, incoordination, spasms, paresthesia or pain. Psychiatric: Denies confusion, memory loss or sensory loss. Endo: Denies change in weight, skin or hair change.  Heme/Lymph: No excessive bleeding, bruising or enlarged lymph nodes.  Physical Exam  BP (!) 126/58   Pulse 72   Temp 97.6 F (36.4 C)   Resp 16   Ht 5\' 2"  (1.575 m)   Wt 120 lb (54.4 kg)   BMI 21.95 kg/m   Appears  well nourished, well groomed  and in no distress.  Eyes: PERRLA, EOMs, conjunctiva no swelling or erythema. Sinuses: No frontal/maxillary tenderness ENT/Mouth: EAC's clear, TM's nl w/o erythema, bulging. Nares clear w/o erythema, swelling, exudates. Oropharynx clear without erythema or exudates. Oral hygiene is good. Tongue normal, non obstructing. Hearing intact.  Neck: Supple. Thyroid not palpable. Car 2+/2+ without bruits, nodes or JVD. Chest: Respirations nl with BS clear & equal w/o rales, rhonchi, wheezing or stridor.  Cor: Heart sounds normal w/ regular rate and rhythm without sig. murmurs, gallops, clicks or rubs. Peripheral pulses normal and equal  without edema.  Abdomen: Soft & bowel sounds normal. Non-tender w/o guarding, rebound, hernias, masses or organomegaly.  Lymphatics: Unremarkable.  Musculoskeletal: Full ROM all peripheral extremities, joint stability, 5/5 strength and normal gait.  Skin: Warm, dry without exposed rashes, lesions or ecchymosis apparent.  Neuro: Cranial nerves intact, reflexes equal bilaterally. Sensory-motor testing grossly intact. Tendon reflexes grossly intact.  Pysch: Alert & oriented x 3.  Insight and judgement nl & appropriate. No ideations.  Assessment and Plan:  1. Essential hypertension  - Continue medication, monitor  blood pressure at home.  - Continue DASH diet.  Reminder to go to the ER if any CP,  SOB, nausea, dizziness, severe HA, changes vision/speech.  - CBC with Differential/Platelet - COMPLETE METABOLIC PANEL WITH GFR - Magnesium  2. Hyperlipidemia, mixed  - Continue diet/meds, exercise,& lifestyle modifications.  - Continue monitor periodic cholesterol/liver & renal functions   - TSH  3. Abnormal glucose  - Continue diet, exercise  - Lifestyle modifications.  - Monitor appropriate labs.  - Hemoglobin A1c - Insulin, random  4. Vitamin D deficiency  - Continue supplementation.  - VITAMIN D 25 Hydroxyl  5. Prediabetes  - Hemoglobin A1c  6. Medication management  - CBC with Differential/Platelet - COMPLETE METABOLIC PANEL WITH GFR - Magnesium - TSH - Hemoglobin A1c - Insulin, random - VITAMIN D 25 Hydroxyl       Discussed  regular exercise, BP monitoring, weight control to achieve/maintain BMI less than 25 and discussed med and SE's. Recommended labs to assess and monitor clinical status with further disposition pending results of labs.  I discussed the assessment and treatment plan with the patient. The patient was provided an opportunity to ask questions and all were answered. The patient agreed with the plan and demonstrated an understanding of the instructions.  I provided over 30 minutes of exam, counseling, chart review and  complex critical decision making.  Kirtland Bouchard, MD

## 2018-11-11 NOTE — Patient Instructions (Signed)

## 2018-11-12 DIAGNOSIS — H353132 Nonexudative age-related macular degeneration, bilateral, intermediate dry stage: Secondary | ICD-10-CM | POA: Diagnosis not present

## 2018-11-12 LAB — CBC WITH DIFFERENTIAL/PLATELET
Absolute Monocytes: 423 cells/uL (ref 200–950)
Basophils Absolute: 28 cells/uL (ref 0–200)
Basophils Relative: 0.6 %
Eosinophils Absolute: 61 cells/uL (ref 15–500)
Eosinophils Relative: 1.3 %
HCT: 34.1 % — ABNORMAL LOW (ref 35.0–45.0)
Hemoglobin: 10.8 g/dL — ABNORMAL LOW (ref 11.7–15.5)
Lymphs Abs: 1546 cells/uL (ref 850–3900)
MCH: 27.1 pg (ref 27.0–33.0)
MCHC: 31.7 g/dL — ABNORMAL LOW (ref 32.0–36.0)
MCV: 85.7 fL (ref 80.0–100.0)
MPV: 12.2 fL (ref 7.5–12.5)
Monocytes Relative: 9 %
Neutro Abs: 2641 cells/uL (ref 1500–7800)
Neutrophils Relative %: 56.2 %
Platelets: 209 10*3/uL (ref 140–400)
RBC: 3.98 10*6/uL (ref 3.80–5.10)
RDW: 13.4 % (ref 11.0–15.0)
Total Lymphocyte: 32.9 %
WBC: 4.7 10*3/uL (ref 3.8–10.8)

## 2018-11-12 LAB — COMPLETE METABOLIC PANEL WITH GFR
AG Ratio: 2 (calc) (ref 1.0–2.5)
ALT: 8 U/L (ref 6–29)
AST: 16 U/L (ref 10–35)
Albumin: 4.5 g/dL (ref 3.6–5.1)
Alkaline phosphatase (APISO): 67 U/L (ref 37–153)
BUN/Creatinine Ratio: 20 (calc) (ref 6–22)
BUN: 21 mg/dL (ref 7–25)
CO2: 25 mmol/L (ref 20–32)
Calcium: 9.9 mg/dL (ref 8.6–10.4)
Chloride: 102 mmol/L (ref 98–110)
Creat: 1.06 mg/dL — ABNORMAL HIGH (ref 0.60–0.88)
GFR, Est African American: 55 mL/min/{1.73_m2} — ABNORMAL LOW (ref >=60)
GFR, Est Non African American: 48 mL/min/{1.73_m2} — ABNORMAL LOW (ref >=60)
Globulin: 2.3 g/dL (calc) (ref 1.9–3.7)
Glucose, Bld: 108 mg/dL — ABNORMAL HIGH (ref 65–99)
Potassium: 4.6 mmol/L (ref 3.5–5.3)
Sodium: 139 mmol/L (ref 135–146)
Total Bilirubin: 0.4 mg/dL (ref 0.2–1.2)
Total Protein: 6.8 g/dL (ref 6.1–8.1)

## 2018-11-12 LAB — MAGNESIUM: Magnesium: 2 mg/dL (ref 1.5–2.5)

## 2018-11-12 LAB — HEMOGLOBIN A1C
Hgb A1c MFr Bld: 5.9 % of total Hgb — ABNORMAL HIGH (ref <5.7)
Mean Plasma Glucose: 123 (calc)
eAG (mmol/L): 6.8 (calc)

## 2018-11-12 LAB — INSULIN, RANDOM: Insulin: 5.6 u[IU]/mL

## 2018-11-12 LAB — VITAMIN D 25 HYDROXY (VIT D DEFICIENCY, FRACTURES): Vit D, 25-Hydroxy: 72 ng/mL (ref 30–100)

## 2018-11-12 LAB — TSH: TSH: 1.56 mIU/L (ref 0.40–4.50)

## 2018-11-13 ENCOUNTER — Encounter: Payer: Self-pay | Admitting: Internal Medicine

## 2018-12-12 DIAGNOSIS — H353132 Nonexudative age-related macular degeneration, bilateral, intermediate dry stage: Secondary | ICD-10-CM | POA: Diagnosis not present

## 2019-01-05 ENCOUNTER — Ambulatory Visit (INDEPENDENT_AMBULATORY_CARE_PROVIDER_SITE_OTHER): Payer: PPO

## 2019-01-05 ENCOUNTER — Encounter: Payer: Self-pay | Admitting: Internal Medicine

## 2019-01-05 ENCOUNTER — Other Ambulatory Visit: Payer: Self-pay

## 2019-01-05 VITALS — Temp 97.5°F

## 2019-01-05 DIAGNOSIS — Z23 Encounter for immunization: Secondary | ICD-10-CM | POA: Diagnosis not present

## 2019-01-05 NOTE — Progress Notes (Signed)
Reports for HD FLU. 

## 2019-01-11 DIAGNOSIS — H353132 Nonexudative age-related macular degeneration, bilateral, intermediate dry stage: Secondary | ICD-10-CM | POA: Diagnosis not present

## 2019-02-10 DIAGNOSIS — H353132 Nonexudative age-related macular degeneration, bilateral, intermediate dry stage: Secondary | ICD-10-CM | POA: Diagnosis not present

## 2019-02-16 NOTE — Progress Notes (Signed)
Annual Screening/Preventative Visit & Comprehensive Evaluation &  Examination     This very nice 85 y.o. WWF presents for a Screening /Preventative Visit & comprehensive evaluation and management of multiple medical co-morbidities.  Patient has been followed for HTN, HLD, Prediabetes  and Vitamin D Deficiency.     Patient is c/o today of issues with poor bladder control with urgency & incontinance.      HTN predates since 1997. Patient does have HT CKD 3.  Patient's BP has been controlled at home and patient denies any cardiac symptoms as chest pain, palpitations, shortness of breath, dizziness or ankle swelling. Today's BP is at goal - 136/60.      Patient's hyperlipidemia is not at goal  with diet and medications. Patient denies myalgias or other medication SE's. Last lipids were not at goal:   Lab Results  Component Value Date   CHOL 202 (H) 02/17/2019   HDL 63 02/17/2019   LDLCALC 115 (H) 02/17/2019   TRIG 125 02/17/2019   CHOLHDL 3.2 02/17/2019      Patient has hx/o prediabetes  (A1c 6.1% / 2010 & 5.7% / 2016)  and patient denies reactive hypoglycemic symptoms, visual blurring, diabetic polys or paresthesias. Last A1c was not at goal:  Lab Results  Component Value Date   HGBA1C 5.8 (H) 02/17/2019      Finally, patient has history of Vitamin D Deficiency ("25" / 2008)  and last Vitamin D was at goal:  Lab Results  Component Value Date   VD25OH 61 02/17/2019   Current Outpatient Medications on File Prior to Visit  Medication Sig  . amLODipine (NORVASC) 5 MG tablet Take 5 mg by mouth daily.  Marland Kitchen aspirin 81 MG tablet Take 81 mg by mouth daily.  . Biotin 2500 MCG CAPS Take by mouth daily.  . Cholecalciferol (VITAMIN D3) 50 MCG (2000 UT) TABS Taking 6000 units daily  . CINNAMON PO Take 2,000 mg by mouth daily.  . Cyanocobalamin (VITAMIN B-12 PO) 1 tablet daily.  . fish oil-omega-3 fatty acids 1000 MG capsule Take 2 g by mouth daily. 1400 mg  . hydrochlorothiazide (HYDRODIURIL)  25 MG tablet Take 1 tablet Daily for Fluid Retention  . olmesartan (BENICAR) 20 MG tablet Take 1 tablet Daily for BP  . OVER THE COUNTER MEDICATION Takes vitamin for her eyes 1 daily  . simvastatin (ZOCOR) 20 MG tablet Take 1 tablet every Night for Cholestrol  . telmisartan (MICARDIS) 20 MG tablet Take 1 tablet daily for BP  . TURMERIC PO Take 1,000 mg by mouth 2 (two) times daily.   No current facility-administered medications on file prior to visit.    Allergies  Allergen Reactions  . Ace Inhibitors     Hyperkalemia    Past Medical History:  Diagnosis Date  . Anemia of chronic renal failure   . Chronic renal insufficiency   . Degenerative joint disease   . HTN (hypertension)   . Hyperlipidemia   . Shingles   . Vitamin D deficiency    Health Maintenance  Topic Date Due  . TETANUS/TDAP  10/24/2026  . INFLUENZA VACCINE  Completed  . DEXA SCAN  Completed  . PNA vac Low Risk Adult  Completed   Immunization History  Administered Date(s) Administered  . Influenza Split 01/17/2013  . Influenza, High Dose Seasonal PF 01/02/2014, 01/16/2015, 01/02/2016, 01/19/2018, 01/05/2019  . Pneumococcal Conjugate-13 06/08/2015  . Pneumococcal Polysaccharide-23 06/04/2010  . Pneumococcal-Unspecified 01/31/1999  . Td 04/01/1995, 12/05/2005, 10/23/2016   Last Colon -  2006 - Dr Olevia Perches - f/u deferred to age.   Last MGM - 03/22/2018 - patient  Has received recall letter & is aware to reschedule   History reviewed. No pertinent surgical history.   Family History  Problem Relation Age of Onset  . CVA Father   . Cancer Daughter        breast   Social History   Tobacco Use  . Smoking status: Never Smoker  . Smokeless tobacco: Never Used  Substance Use Topics  . Alcohol use: Yes    Alcohol/week: 1.0 standard drinks    Types: 1 Glasses of wine per week    Comment: occasional wine  . Drug use: No    ROS Constitutional: Denies fever, chills, weight loss/gain, headaches, insomnia,   night sweats, and change in appetite. Does c/o fatigue. Eyes: Denies redness, blurred vision, diplopia, discharge, itchy, watery eyes.  ENT: Denies discharge, congestion, post nasal drip, epistaxis, sore throat, earache, hearing loss, dental pain, Tinnitus, Vertigo, Sinus pain, snoring.  Cardio: Denies chest pain, palpitations, irregular heartbeat, syncope, dyspnea, diaphoresis, orthopnea, PND, claudication, edema Respiratory: denies cough, dyspnea, DOE, pleurisy, hoarseness, laryngitis, wheezing.  Gastrointestinal: Denies dysphagia, heartburn, reflux, water brash, pain, cramps, nausea, vomiting, bloating, diarrhea, constipation, hematemesis, melena, hematochezia, jaundice, hemorrhoids Genitourinary: Denies dysuria, frequency, urgency, nocturia, hesitancy, discharge, hematuria, flank pain Breast: Breast lumps, nipple discharge, bleeding.  Musculoskeletal: Denies arthralgia, myalgia, stiffness, Jt. Swelling, pain, limp, and strain/sprain. Denies falls. Skin: Denies puritis, rash, hives, warts, acne, eczema, changing in skin lesion Neuro: No weakness, tremor, incoordination, spasms, paresthesia, pain Psychiatric: Denies confusion, memory loss, sensory loss. Denies Depression. Endocrine: Denies change in weight, skin, hair change, nocturia, and paresthesia, diabetic polys, visual blurring, hyper / hypo glycemic episodes.  Heme/Lymph: No excessive bleeding, bruising, enlarged lymph nodes.  Physical Exam  BP 136/60   Pulse 72   Temp (!) 97.4 F (36.3 C)   Resp 16   Ht 5' 1.75" (1.568 m)   Wt 119 lb (54 kg)   BMI 21.94 kg/m   General Appearance: Well nourished, well groomed and in no apparent distress.  Eyes: PERRLA, EOMs, conjunctiva no swelling or erythema, normal fundi and vessels. Sinuses: No frontal/maxillary tenderness ENT/Mouth: EACs patent / TMs  nl. Nares clear without erythema, swelling, mucoid exudates. Oral hygiene is good. No erythema, swelling, or exudate. Tongue normal,  non-obstructing. Tonsils not swollen or erythematous. Hearing normal.  Neck: Supple, thyroid not palpable. No bruits, nodes or JVD. Respiratory: Respiratory effort normal.  BS equal and clear bilateral without rales, rhonci, wheezing or stridor. Cardio: Heart sounds are normal with regular rate and rhythm and no murmurs, rubs or gallops. Peripheral pulses are normal and equal bilaterally without edema. No aortic or femoral bruits. Chest: symmetric with normal excursions and percussion. Breasts: Symmetric, without lumps, nipple discharge, retractions, or fibrocystic changes.  Abdomen: Flat, soft with bowel sounds active. Nontender, no guarding, rebound, hernias, masses, or organomegaly.  Lymphatics: Non tender without lymphadenopathy.  Genitourinary:  Musculoskeletal: Full ROM all peripheral extremities, joint stability, 5/5 strength, and normal gait. Skin: Warm and dry without rashes, lesions, cyanosis, clubbing or  ecchymosis.  Neuro: Cranial nerves intact, reflexes equal bilaterally. Normal muscle tone, no cerebellar symptoms. Sensation intact.  Pysch: Alert and oriented X 3, normal affect, Insight and Judgment appropriate.   Assessment and Plan  1. Annual Preventative Screening Examination  2. Essential hypertension  - EKG 12-Lead - Urinalysis, Routine w reflex microscopic - Microalbumin / Creatinine Urine Ratio - CBC with Diff -  COMPLETE METABOLIC PANEL WITH GFR - Magnesium - TSH  3. Hyperlipidemia, mixed  - EKG 12-Lead - Lipid Profile - TSH  4. Abnormal glucose  - EKG 12-Lead - Hemoglobin A1c ([facility]) - Insulin, random  5. Vitamin D deficiency  - Vitamin D (25 hydroxy)  6. Prediabetes  - EKG 12-Lead - Hemoglobin A1c ([facility]) - Insulin, random  7. Screening for colorectal cancer  8. Screening for ischemic heart disease  - EKG 12-Lead  9. Family history of cerebrovascular event  - EKG 12-Lead  10. Medication management  - Urinalysis, Routine w reflex  microscopic - Microalbumin / Creatinine Urine Ratio - CBC with Diff - COMPLETE METABOLIC PANEL WITH GFR - Magnesium - Lipid Profile - TSH - Hemoglobin A1c ([facility]) - Insulin, random - Vitamin D (25 hydroxy)     Patient given Sx's Myrbetriq 25 mg (#14) and 50 mg (#14) to try for bladder control     Patient was counseled in prudent diet to achieve/maintain BMI less than 25 for weight control, BP monitoring, regular exercise and medications. Discussed med's effects and SE's. Screening labs and tests as requested with regular follow-up as recommended. Over 40 minutes of exam, counseling, chart review and high complex critical decision making was performed.   Marinus MawWilliam D Merrit Waugh, MD

## 2019-02-17 ENCOUNTER — Other Ambulatory Visit: Payer: Self-pay

## 2019-02-17 ENCOUNTER — Encounter: Payer: Self-pay | Admitting: Internal Medicine

## 2019-02-17 ENCOUNTER — Ambulatory Visit (INDEPENDENT_AMBULATORY_CARE_PROVIDER_SITE_OTHER): Payer: PPO | Admitting: Internal Medicine

## 2019-02-17 VITALS — BP 136/60 | HR 72 | Temp 97.4°F | Resp 16 | Ht 61.75 in | Wt 119.0 lb

## 2019-02-17 DIAGNOSIS — I10 Essential (primary) hypertension: Secondary | ICD-10-CM | POA: Diagnosis not present

## 2019-02-17 DIAGNOSIS — Z0001 Encounter for general adult medical examination with abnormal findings: Secondary | ICD-10-CM

## 2019-02-17 DIAGNOSIS — E559 Vitamin D deficiency, unspecified: Secondary | ICD-10-CM | POA: Diagnosis not present

## 2019-02-17 DIAGNOSIS — Z1211 Encounter for screening for malignant neoplasm of colon: Secondary | ICD-10-CM

## 2019-02-17 DIAGNOSIS — Z0000 Encounter for general adult medical examination without abnormal findings: Secondary | ICD-10-CM | POA: Diagnosis not present

## 2019-02-17 DIAGNOSIS — Z79899 Other long term (current) drug therapy: Secondary | ICD-10-CM | POA: Diagnosis not present

## 2019-02-17 DIAGNOSIS — R7309 Other abnormal glucose: Secondary | ICD-10-CM

## 2019-02-17 DIAGNOSIS — Z136 Encounter for screening for cardiovascular disorders: Secondary | ICD-10-CM

## 2019-02-17 DIAGNOSIS — Z8249 Family history of ischemic heart disease and other diseases of the circulatory system: Secondary | ICD-10-CM

## 2019-02-17 DIAGNOSIS — R7303 Prediabetes: Secondary | ICD-10-CM | POA: Diagnosis not present

## 2019-02-17 DIAGNOSIS — E782 Mixed hyperlipidemia: Secondary | ICD-10-CM

## 2019-02-17 NOTE — Patient Instructions (Signed)
Vit D  & Vit C 1,000 mg   are recommended to help protect  against the Covid-19 and other Corona viruses.    Also it's recommended  to take  Zinc 50 mg  to help  protect against the Covid-19   and best place to get  is also on [email or web address]  and don't pay more than 6-8 cents /pill !  ================================ Coronavirus (COVID-19) Are you at risk?  Are you at risk for the Coronavirus (COVID-19)?  To be considered HIGH RISK for Coronavirus (COVID-19), you have to meet the following criteria:  . Traveled to [place], [place], [place], [place] or [place]; or in the [place] to [place], [place], [place]  . or [place]; and have fever, cough, and shortness of breath within the last 2 weeks of travel OR . Been in close contact with a person diagnosed with COVID-19 within the last 2 weeks and have  . fever, cough,and shortness of breath .  . IF YOU DO NOT MEET THESE CRITERIA, YOU ARE CONSIDERED LOW RISK FOR COVID-19.  What to do if you are HIGH RISK for COVID-19?  Marland Kitchen If you are having a medical emergency, call 911. . Seek medical care right away. Before you go to a doctor's office, urgent care or emergency department, .  call ahead and tell them about your recent travel, contact with someone diagnosed with COVID-19  .  and your symptoms.  . You should receive instructions from your physician's office regarding next steps of care.  . When you arrive at healthcare provider, tell the healthcare staff immediately you have returned from  . visiting [place], [place], [place], [place] or [place]; or traveled in the [place] to [place], [place],  . [place] or [place] in the last two weeks or you have been in close contact with a person diagnosed with  . COVID-19 in the last 2 weeks.   . Tell the health care staff about your symptoms: fever, cough and shortness of breath. . After you have been seen by a medical provider, you will be either: o Tested for (COVID-19) and  discharged home on quarantine except to seek medical care if  o symptoms worsen, and asked to  - Stay home and avoid contact with others until you get your results (4-5 days)  - Avoid travel on public transportation if possible (such as bus, train, or airplane) or o Sent to the Emergency Department by EMS for evaluation, COVID-19 testing  and  o possible admission depending on your condition and test results.  What to do if you are LOW RISK for COVID-19?  Reduce your risk of any infection by using the same precautions used for avoiding the common cold or flu:  Marland Kitchen Wash your hands often with soap and warm water for at least 20 seconds.  If soap and water are not readily available,  . use an alcohol-based hand sanitizer with at least 60% alcohol.  . If coughing or sneezing, cover your mouth and nose by coughing or sneezing into the elbow areas of your shirt or coat, .  into a tissue or into your sleeve (not your hands). . Avoid shaking hands with others and consider head nods or verbal greetings only. . Avoid touching your eyes, nose, or mouth with unwashed hands.  . Avoid close contact with people who are sick. . Avoid places or events with large numbers of people in one location, like concerts or sporting events. Marland Kitchen  Carefully consider travel plans you have or are making. . If you are planning any travel outside or inside the [place], visit the CDC's Travelers' Health webpage for the latest health notices. . If you have some symptoms but not all symptoms, continue to monitor at home and seek medical attention  . if your symptoms worsen. . If you are having a medical emergency, call 911.   . >>>>>>>>>>>>>>>>>>>>>>>>>>>>>>>>> . We Do NOT Approve of  [facility], [employer]  Our Patients  To Do Home Visits & We Do NOT Approve of [employer] > > > > > > > > > > > > > > > > > > > > > > > > > > > > > > > > > > > > > > >  Preventive Care for Adults  A healthy lifestyle  and preventive care can promote health and wellness. Preventive health guidelines for women include the following key practices.  A routine yearly physical is a good way to check with your health care provider about your health and preventive screening. It is a chance to share any concerns and updates on your health and to receive a thorough exam.  Visit your dentist for a routine exam and preventive care every 6 months. Brush your teeth twice a day and floss once a day. Good oral hygiene prevents tooth decay and gum disease.  The frequency of eye exams is based on your age, health, family medical history, use of contact lenses, and other factors. Follow your health care provider's recommendations for frequency of eye exams.  Eat a healthy diet. Foods like vegetables, fruits, whole grains, low-fat dairy products, and lean protein foods contain the nutrients you need without too many calories. Decrease your intake of foods high in solid fats, added sugars, and salt. Eat the right amount of calories for you. Get information about a proper diet from your health care provider, if necessary.  Regular physical exercise is one of the most important things you can do for your health. Most adults should get at least 150 minutes of moderate-intensity exercise (any activity that increases your heart rate and causes you to sweat) each week. In addition, most adults need muscle-strengthening exercises on 2 or more days a week.  Maintain a healthy weight. The body mass index (BMI) is a screening tool to identify possible weight problems. It provides an estimate of body fat based on height and weight. Your health care provider can find your BMI and can help you achieve or maintain a healthy weight. For adults 20 years and older:  A BMI below 18.5 is considered underweight.  A BMI of 18.5 to 24.9 is normal.  A BMI of 25 to 29.9 is considered overweight.  A BMI of 30 and above is considered obese.  Maintain  normal blood lipids and cholesterol levels by exercising and minimizing your intake of saturated fat. Eat a balanced diet with plenty of fruit and vegetables. If your lipid or cholesterol levels are high, you are over 50, or you are at high risk for heart disease, you may need your cholesterol levels checked more frequently. Ongoing high lipid and cholesterol levels should be treated with medicines if diet and exercise are not working.  If you smoke, find out from your health care provider how to quit. If you do not use tobacco, do not start.  Lung cancer screening is recommended for adults aged 32-80 years who are at high risk for developing lung cancer  because of a history of smoking. A yearly low-dose CT scan of the lungs is recommended for people who have at least a 30-pack-year history of smoking and are a current smoker or have quit within the past 15 years. A pack year of smoking is smoking an average of 1 pack of cigarettes a day for 1 year (for example: 1 pack a day for 30 years or 2 packs a day for 15 years). Yearly screening should continue until the smoker has stopped smoking for at least 15 years. Yearly screening should be stopped for people who develop a health problem that would prevent them from having lung cancer treatment.  Avoid use of street drugs. Do not share needles with anyone. Ask for help if you need support or instructions about stopping the use of drugs.  High blood pressure causes heart disease and increases the risk of stroke.  Ongoing high blood pressure should be treated with medicines if weight loss and exercise do not work.  If you are 29-56 years old, ask your health care provider if you should take aspirin to prevent strokes.  Diabetes screening involves taking a blood sample to check your fasting blood sugar level. This should be done once every 3 years, after age 53, if you are within normal weight and without risk factors for diabetes. Testing should be considered  at a younger age or be carried out more frequently if you are overweight and have at least 1 risk factor for diabetes.  Breast cancer screening is essential preventive care for women. You should practice "breast self-awareness." This means understanding the normal appearance and feel of your breasts and may include breast self-examination. Any changes detected, no matter how small, should be reported to a health care provider. Women in their 48s and 30s should have a clinical breast exam (CBE) by a health care provider as part of a regular health exam every 1 to 3 years. After age 92, women should have a CBE every year. Starting at age 49, women should consider having a mammogram (breast X-ray test) every year. Women who have a family history of breast cancer should talk to their health care provider about genetic screening. Women at a high risk of breast cancer should talk to their health care providers about having an MRI and a mammogram every year.  Breast cancer gene (BRCA)-related cancer risk assessment is recommended for women who have family members with BRCA-related cancers. BRCA-related cancers include breast, ovarian, tubal, and peritoneal cancers. Having family members with these cancers may be associated with an increased risk for harmful changes (mutations) in the breast cancer genes BRCA1 and BRCA2. Results of the assessment will determine the need for genetic counseling and BRCA1 and BRCA2 testing.  Routine pelvic exams to screen for cancer are no longer recommended for nonpregnant women who are considered low risk for cancer of the pelvic organs (ovaries, uterus, and vagina) and who do not have symptoms. Ask your health care provider if a screening pelvic exam is right for you.  If you have had past treatment for cervical cancer or a condition that could lead to cancer, you need Pap tests and screening for cancer for at least 20 years after your treatment. If Pap tests have been discontinued,  your risk factors (such as having a new sexual partner) need to be reassessed to determine if screening should be resumed. Some women have medical problems that increase the chance of getting cervical cancer. In these cases, your health care  provider may recommend more frequent screening and Pap tests.    Colorectal cancer can be detected and often prevented. Most routine colorectal cancer screening begins at the age of 12 years and continues through age 30 years. However, your health care provider may recommend screening at an earlier age if you have risk factors for colon cancer. On a yearly basis, your health care provider may provide home test kits to check for hidden blood in the stool. Use of a small camera at the end of a tube, to directly examine the colon (sigmoidoscopy or colonoscopy), can detect the earliest forms of colorectal cancer. Talk to your health care provider about this at age 20, when routine screening begins.  Direct exam of the colon should be repeated every 5-10 years through age 68 years, unless early forms of pre-cancerous polyps or small growths are found.  Osteoporosis is a disease in which the bones lose minerals and strength with aging. This can result in serious bone fractures or breaks. The risk of osteoporosis can be identified using a bone density scan. Women ages 11 years and over and women at risk for fractures or osteoporosis should discuss screening with their health care providers. Ask your health care provider whether you should take a calcium supplement or vitamin D to reduce the rate of osteoporosis.  Menopause can be associated with physical symptoms and risks. Hormone replacement therapy is available to decrease symptoms and risks. You should talk to your health care provider about whether hormone replacement therapy is right for you.  Use sunscreen. Apply sunscreen liberally and repeatedly throughout the day. You should seek shade when your shadow is shorter  than you. Protect yourself by wearing long sleeves, pants, a wide-brimmed hat, and sunglasses year round, whenever you are outdoors.  Once a month, do a whole body skin exam, using a mirror to look at the skin on your back. Tell your health care provider of new moles, moles that have irregular borders, moles that are larger than a pencil eraser, or moles that have changed in shape or color.  Stay current with required vaccines (immunizations).  Influenza vaccine. All adults should be immunized every year.  Tetanus, diphtheria, and acellular pertussis (Td, Tdap) vaccine. Pregnant women should receive 1 dose of Tdap vaccine during each pregnancy. The dose should be obtained regardless of the length of time since the last dose. Immunization is preferred during the 27th-36th week of gestation. An adult who has not previously received Tdap or who does not know her vaccine status should receive 1 dose of Tdap. This initial dose should be followed by tetanus and diphtheria toxoids (Td) booster doses every 10 years. Adults with an unknown or incomplete history of completing a 3-dose immunization series with Td-containing vaccines should begin or complete a primary immunization series including a Tdap dose. Adults should receive a Td booster every 10 years.    Zoster vaccine. One dose is recommended for adults aged 25 years or older unless certain conditions are present.    Pneumococcal 13-valent conjugate (PCV13) vaccine. When indicated, a person who is uncertain of her immunization history and has no record of immunization should receive the PCV13 vaccine. An adult aged 77 years or older who has certain medical conditions and has not been previously immunized should receive 1 dose of PCV13 vaccine. This PCV13 should be followed with a dose of pneumococcal polysaccharide (PPSV23) vaccine. The PPSV23 vaccine dose should be obtained at least 1 or more year(s) after the dose of  PCV13 vaccine. An adult aged 68  years or older who has certain medical conditions and previously received 1 or more doses of PPSV23 vaccine should receive 1 dose of PCV13. The PCV13 vaccine dose should be obtained 1 or more years after the last PPSV23 vaccine dose.    Pneumococcal polysaccharide (PPSV23) vaccine. When PCV13 is also indicated, PCV13 should be obtained first. All adults aged 61 years and older should be immunized. An adult younger than age 57 years who has certain medical conditions should be immunized. Any person who resides in a nursing home or long-term care facility should be immunized. An adult smoker should be immunized. People with an immunocompromised condition and certain other conditions should receive both PCV13 and PPSV23 vaccines. People with human immunodeficiency virus (HIV) infection should be immunized as soon as possible after diagnosis. Immunization during chemotherapy or radiation therapy should be avoided. Routine use of PPSV23 vaccine is not recommended for American Indians, [place]atives, or people younger than 65 years unless there are medical conditions that require PPSV23 vaccine. When indicated, people who have unknown immunization and have no record of immunization should receive PPSV23 vaccine. One-time revaccination 5 years after the first dose of PPSV23 is recommended for people aged 19-64 years who have chronic kidney failure, nephrotic syndrome, asplenia, or immunocompromised conditions. People who received 1-2 doses of PPSV23 before age 30 years should receive another dose of PPSV23 vaccine at age 85 years or later if at least 5 years have passed since the previous dose. Doses of PPSV23 are not needed for people immunized with PPSV23 at or after age 57 years.   Preventive Services / Frequency  Ages 77 years and over  Blood pressure check.  Lipid and cholesterol check.  Lung cancer screening. / Every year if you are aged 69-80 years and have a 30-pack-year history of smoking and  currently smoke or have quit within the past 15 years. Yearly screening is stopped once you have quit smoking for at least 15 years or develop a health problem that would prevent you from having lung cancer treatment.  Clinical breast exam.** / Every year after age 1 years.   BRCA-related cancer risk assessment.** / For women who have family members with a BRCA-related cancer (breast, ovarian, tubal, or peritoneal cancers).  Mammogram.** / Every year beginning at age 74 years and continuing for as long as you are in good health. Consult with your health care provider.  Pap test.** / Every 3 years starting at age 59 years through age 36 or 61 years with 3 consecutive normal Pap tests. Testing can be stopped between 65 and 70 years with 3 consecutive normal Pap tests and no abnormal Pap or HPV tests in the past 10 years.  Fecal occult blood test (FOBT) of stool. / Every year beginning at age 69 years and continuing until age 70 years. You may not need to do this test if you get a colonoscopy every 10 years.  Flexible sigmoidoscopy or colonoscopy.** / Every 5 years for a flexible sigmoidoscopy or every 10 years for a colonoscopy beginning at age 58 years and continuing until age 64 years.  Hepatitis C blood test.** / For all people born from [birth year] through 1963 and any individual with known risks for hepatitis C.  Osteoporosis screening.** / A one-time screening for women ages 70 years and over and women at risk for fractures or osteoporosis.  Skin self-exam. / Monthly.  Influenza vaccine. / Every year.  Tetanus, diphtheria, and acellular  pertussis (Tdap/Td) vaccine.** / 1 dose of Td every 10 years.  Zoster vaccine.** / 1 dose for adults aged 60 years or older.  Pneumococcal 13-valent conjugate (PCV13) vaccine.** / Consult your health care provider.  Pneumococcal polysaccharide (PPSV23) vaccine.** / 1 dose for all adults aged 65 years and older. Screening for abdominal aortic aneurysm (AAA)   by ultrasound is recommended for people who have history of high blood pressure or who are current or former smokers. ++++++++++++++++++++ Recommend Adult Low Dose Aspirin or  coated  Aspirin 81 mg daily  To reduce risk of Colon Cancer 40 %,  Skin Cancer 26 % ,  Melanoma 46%  and  Pancreatic cancer 60% ++++++++++++++++++++ Vitamin D goal  is between 70-100.  Please make sure that you are taking your Vitamin D as directed.  It is very important as a natural anti-inflammatory  helping hair, skin, and nails, as well as reducing stroke and heart attack risk.  It helps your bones and helps with mood. It also decreases numerous cancer risks so please take it as directed.  Low Vit D is associated with a 200-300% higher risk for CANCER  and 200-300% higher risk for HEART   ATTACK  &  STROKE.   ...................................... It is also associated with higher death rate at younger ages,  autoimmune diseases like Rheumatoid arthritis, Lupus, Multiple Sclerosis.    Also many other serious conditions, like depression, Alzheimer's Dementia, infertility, muscle aches, fatigue, fibromyalgia - just to name a few. ++++++++++++++++++ Recommend the book "The END of DIETING" by Dr Joel Fuhrman  & the book "The END of DIABETES " by Dr Joel Fuhrman At [employer] - get book & Audio CD's    Being diabetic has a  300% increased risk for heart attack, stroke, cancer, and alzheimer- type vascular dementia. It is very important that you work harder with diet by avoiding all foods that are white. Avoid white rice (brown & wild rice is OK), white potatoes (sweetpotatoes in moderation is OK), White bread or wheat bread or anything made out of white flour like bagels, donuts, rolls, buns, biscuits, cakes, pastries, cookies, pizza crust, and pasta (made from white flour & egg whites) - vegetarian pasta or spinach or wheat pasta is OK. Multigrain breads like Arnold's or [employer], or multigrain sandwich thins  or flatbreads.  Diet, exercise and weight loss can reverse and cure diabetes in the early stages.  Diet, exercise and weight loss is very important in the control and prevention of complications of diabetes which affects every system in your body, ie. Brain - dementia/stroke, eyes - glaucoma/blindness, heart - heart attack/heart failure, kidneys - dialysis, stomach - gastric paralysis, intestines - malabsorption, nerves - severe painful neuritis, circulation - gangrene & loss of a leg(s), and finally cancer and Alzheimers.    I recommend avoid fried & greasy foods,  sweets/candy, white rice (brown or wild rice or Quinoa is OK), white potatoes (sweet potatoes are OK) - anything made from white flour - bagels, doughnuts, rolls, buns, biscuits,white and wheat breads, pizza crust and traditional pasta made of white flour & egg white(vegetarian pasta or spinach or wheat pasta is OK).  Multi-grain bread is OK - like multi-grain flat bread or sandwich thins. Avoid alcohol in excess. Exercise is also important.    Eat all the vegetables you want - avoid meat, especially red meat and dairy - especially cheese.  Cheese is the most concentrated form of trans-fats which is the worst thing to clog   up our arteries. Veggie cheese is OK which can be found in the fresh produce section at [employer] or [employer] or [employer]  +++++++++++++++++++ DASH Eating Plan  DASH stands for "Dietary Approaches to Stop Hypertension."   The DASH eating plan is a healthy eating plan that has been shown to reduce high blood pressure (hypertension). Additional health benefits may include reducing the risk of type 2 diabetes mellitus, heart disease, and stroke. The DASH eating plan may also help with weight loss. WHAT DO I NEED TO KNOW ABOUT THE DASH EATING PLAN? For the DASH eating plan, you will follow these general guidelines:  Choose foods with a percent daily value for sodium of less than 5% (as listed on the food  label).  Use salt-free seasonings or herbs instead of table salt or sea salt.  Check with your health care provider or pharmacist before using salt substitutes.  Eat lower-sodium products, often labeled as "lower sodium" or "no salt added."  Eat fresh foods.  Eat more vegetables, fruits, and low-fat dairy products.  Choose whole grains. Look for the word "whole" as the first word in the ingredient list.  Choose fish   Limit sweets, desserts, sugars, and sugary drinks.  Choose heart-healthy fats.  Eat veggie cheese   Eat more home-cooked food and less restaurant, buffet, and fast food.  Limit fried foods.  Cook foods using methods other than frying.  Limit canned vegetables. If you do use them, rinse them well to decrease the sodium.  When eating at a restaurant, ask that your food be prepared with less salt, or no salt if possible.                      WHAT FOODS CAN I EAT? Read Dr Fara Olden Fuhrman's books on The End of Dieting & The End of Diabetes  Grains Whole grain or whole wheat bread. Brown rice. Whole grain or whole wheat pasta. Quinoa, bulgur, and whole grain cereals. Low-sodium cereals. Corn or whole wheat flour tortillas. Whole grain cornbread. Whole grain crackers. Low-sodium crackers.  Vegetables Fresh or frozen vegetables (raw, steamed, roasted, or grilled). Low-sodium or reduced-sodium tomato and vegetable juices. Low-sodium or reduced-sodium tomato sauce and paste. Low-sodium or reduced-sodium canned vegetables.   Fruits All fresh, canned (in natural juice), or frozen fruits.  Protein Products  All fish and seafood.  Dried beans, peas, or lentils. Unsalted nuts and seeds. Unsalted canned beans.  Dairy Low-fat dairy products, such as skim or 1% milk, 2% or reduced-fat cheeses, low-fat ricotta or cottage cheese, or plain low-fat yogurt. Low-sodium or reduced-sodium cheeses.  Fats and Oils Tub margarines without trans fats. Light or reduced-fat mayonnaise  and salad dressings (reduced sodium). Avocado. Safflower, olive, or canola oils. Natural peanut or almond butter.  Other Unsalted popcorn and pretzels. The items listed above may not be a complete list of recommended foods or beverages. Contact your dietitian for more options.  +++++++++++++++  WHAT FOODS ARE NOT RECOMMENDED? Grains/ White flour or wheat flour White bread. White pasta. White rice. Refined cornbread. Bagels and croissants. Crackers that contain trans fat.  Vegetables  Creamed or fried vegetables. Vegetables in a . Regular canned vegetables. Regular canned tomato sauce and paste. Regular tomato and vegetable juices.  Fruits Dried fruits. Canned fruit in light or heavy syrup. Fruit juice.  Meat and Other Protein Products Meat in general - RED meat & White meat.  Fatty cuts of meat. Ribs, chicken wings, all processed meats as  bacon, sausage, bologna, salami, fatback, hot dogs, bratwurst and packaged luncheon meats.  Dairy Whole or 2% milk, cream, half-and-half, and cream cheese. Whole-fat or sweetened yogurt. Full-fat cheeses or blue cheese. Non-dairy creamers and whipped toppings. Processed cheese, cheese spreads, or cheese curds.  Condiments Onion and garlic salt, seasoned salt, table salt, and sea salt. Canned and packaged gravies. Worcestershire sauce. Tartar sauce. Barbecue sauce. Teriyaki sauce. Soy sauce, including reduced sodium. Steak sauce. Fish sauce. Oyster sauce. Cocktail sauce. Horseradish. Ketchup and mustard. Meat flavorings and tenderizers. Bouillon cubes. Hot sauce. Tabasco sauce. Marinades. Taco seasonings. Relishes.  Fats and Oils Butter, stick margarine, lard, shortening and bacon fat. Coconut, palm kernel, or palm oils. Regular salad dressings.  Pickles and olives. Salted popcorn and pretzels.  The items listed above may not be a complete list of foods and beverages to avoid.

## 2019-02-18 LAB — MICROALBUMIN / CREATININE URINE RATIO
Creatinine, Urine: 177 mg/dL (ref 20–275)
Microalb Creat Ratio: 16 mcg/mg creat (ref <30)
Microalb, Ur: 2.9 mg/dL

## 2019-02-18 LAB — CBC WITH DIFFERENTIAL/PLATELET
Absolute Monocytes: 365 cells/uL (ref 200–950)
Basophils Absolute: 32 cells/uL (ref 0–200)
Basophils Relative: 0.7 %
Eosinophils Absolute: 32 cells/uL (ref 15–500)
Eosinophils Relative: 0.7 %
HCT: 35.2 % (ref 35.0–45.0)
Hemoglobin: 11.1 g/dL — ABNORMAL LOW (ref 11.7–15.5)
Lymphs Abs: 1395 cells/uL (ref 850–3900)
MCH: 27.1 pg (ref 27.0–33.0)
MCHC: 31.5 g/dL — ABNORMAL LOW (ref 32.0–36.0)
MCV: 85.9 fL (ref 80.0–100.0)
MPV: 12.2 fL (ref 7.5–12.5)
Monocytes Relative: 8.1 %
Neutro Abs: 2678 cells/uL (ref 1500–7800)
Neutrophils Relative %: 59.5 %
Platelets: 222 10*3/uL (ref 140–400)
RBC: 4.1 10*6/uL (ref 3.80–5.10)
RDW: 13.6 % (ref 11.0–15.0)
Total Lymphocyte: 31 %
WBC: 4.5 10*3/uL (ref 3.8–10.8)

## 2019-02-18 LAB — URINALYSIS, ROUTINE W REFLEX MICROSCOPIC
Bacteria, UA: NONE SEEN /HPF
Bilirubin Urine: NEGATIVE
Glucose, UA: NEGATIVE
Hgb urine dipstick: NEGATIVE
Hyaline Cast: NONE SEEN /LPF
Ketones, ur: NEGATIVE
Nitrite: NEGATIVE
Protein, ur: NEGATIVE
RBC / HPF: NONE SEEN /HPF (ref 0–2)
Specific Gravity, Urine: 1.019 (ref 1.001–1.03)
Squamous Epithelial / HPF: NONE SEEN /HPF (ref <=5)
pH: 5.5 (ref 5.0–8.0)

## 2019-02-18 LAB — COMPLETE METABOLIC PANEL WITH GFR
AG Ratio: 2 (calc) (ref 1.0–2.5)
ALT: 10 U/L (ref 6–29)
AST: 19 U/L (ref 10–35)
Albumin: 4.7 g/dL (ref 3.6–5.1)
Alkaline phosphatase (APISO): 68 U/L (ref 37–153)
BUN/Creatinine Ratio: 20 (calc) (ref 6–22)
BUN: 21 mg/dL (ref 7–25)
CO2: 26 mmol/L (ref 20–32)
Calcium: 10 mg/dL (ref 8.6–10.4)
Chloride: 102 mmol/L (ref 98–110)
Creat: 1.07 mg/dL — ABNORMAL HIGH (ref 0.60–0.88)
GFR, Est African American: 54 mL/min/{1.73_m2} — ABNORMAL LOW (ref >=60)
GFR, Est Non African American: 47 mL/min/{1.73_m2} — ABNORMAL LOW (ref >=60)
Globulin: 2.4 g/dL (calc) (ref 1.9–3.7)
Glucose, Bld: 109 mg/dL — ABNORMAL HIGH (ref 65–99)
Potassium: 4.7 mmol/L (ref 3.5–5.3)
Sodium: 138 mmol/L (ref 135–146)
Total Bilirubin: 0.4 mg/dL (ref 0.2–1.2)
Total Protein: 7.1 g/dL (ref 6.1–8.1)

## 2019-02-18 LAB — LIPID PANEL
Cholesterol: 202 mg/dL — ABNORMAL HIGH (ref <200)
HDL: 63 mg/dL (ref >=50)
LDL Cholesterol (Calc): 115 mg/dL (calc) — ABNORMAL HIGH
Non-HDL Cholesterol (Calc): 139 mg/dL (calc) — ABNORMAL HIGH (ref <130)
Total CHOL/HDL Ratio: 3.2 (calc) (ref <5.0)
Triglycerides: 125 mg/dL (ref <150)

## 2019-02-18 LAB — MAGNESIUM: Magnesium: 2 mg/dL (ref 1.5–2.5)

## 2019-02-18 LAB — HEMOGLOBIN A1C
Hgb A1c MFr Bld: 5.8 % of total Hgb — ABNORMAL HIGH (ref <5.7)
Mean Plasma Glucose: 120 (calc)
eAG (mmol/L): 6.6 (calc)

## 2019-02-18 LAB — TSH: TSH: 1.89 mIU/L (ref 0.40–4.50)

## 2019-02-18 LAB — VITAMIN D 25 HYDROXY (VIT D DEFICIENCY, FRACTURES): Vit D, 25-Hydroxy: 61 ng/mL (ref 30–100)

## 2019-02-18 LAB — INSULIN, RANDOM: Insulin: 8.9 u[IU]/mL

## 2019-02-19 ENCOUNTER — Encounter: Payer: Self-pay | Admitting: Internal Medicine

## 2019-02-21 NOTE — Progress Notes (Signed)
02/21/2019-Patient is aware of lab results. Ann [name]

## 2019-02-22 ENCOUNTER — Other Ambulatory Visit: Payer: Self-pay | Admitting: Internal Medicine

## 2019-03-12 DIAGNOSIS — H353132 Nonexudative age-related macular degeneration, bilateral, intermediate dry stage: Secondary | ICD-10-CM | POA: Diagnosis not present

## 2019-04-11 DIAGNOSIS — H353132 Nonexudative age-related macular degeneration, bilateral, intermediate dry stage: Secondary | ICD-10-CM | POA: Diagnosis not present

## 2019-04-29 ENCOUNTER — Ambulatory Visit: Payer: Self-pay | Admitting: Adult Health

## 2019-05-11 DIAGNOSIS — H04123 Dry eye syndrome of bilateral lacrimal glands: Secondary | ICD-10-CM | POA: Diagnosis not present

## 2019-05-11 DIAGNOSIS — H43813 Vitreous degeneration, bilateral: Secondary | ICD-10-CM | POA: Diagnosis not present

## 2019-05-11 DIAGNOSIS — H35033 Hypertensive retinopathy, bilateral: Secondary | ICD-10-CM | POA: Diagnosis not present

## 2019-05-11 DIAGNOSIS — H353132 Nonexudative age-related macular degeneration, bilateral, intermediate dry stage: Secondary | ICD-10-CM | POA: Diagnosis not present

## 2019-05-20 DIAGNOSIS — N183 Chronic kidney disease, stage 3 unspecified: Secondary | ICD-10-CM | POA: Insufficient documentation

## 2019-05-20 NOTE — Progress Notes (Signed)
Patient ID: Ann [name], female   DOB: 1930-09-16, 86 y.o.   MRN: [id number]  MEDICARE ANNUAL WELLNESS VISIT AND FOLLOW UP  Assessment:   Encounter for annual medicare visit  Essential hypertension Well controlled with current medications - continue medications, DASH diet, exercise and monitor at home. Call if greater than 130/80.   Osteoporosis, unspecified osteoporosis type, unspecified pathological fracture presence Monitor Declines DEXA today, wouldn't do bisphosphonates Discussed high calcium diet, vitamin D, weight bearing   CKD Stage III  (GFR 30-59 ml/min) Increase fluids, avoid NSAIDS, monitor sugars, will monitor closely She prefers to avoid nephrology referral unless absolutely necessary   Anemia r/t CKD III Monitor CBC  Hyperlipidemia, unspecified hyperlipidemia type -continue medications, check lipids at , decrease fatty foods, increase activity.   Prediabetes Discussed disease and risks Discussed diet/exercise, weight management  A1C q42m, defer today, check serum glucose, monitor weight trend  Medication management Monitor  Other abnormal glucose Monitor  Vitamin D deficiency Continue supplement  Primary osteoarthritis involving multiple joints Tylenol, avoid NSAIDs -     diclofenac sodium (VOLTAREN) 1 % GEL; Apply 4 g topically 4 (four) times daily.  Urinary incontinence Declines workup or other medication trials;  monitor    Over 30 minutes of exam, counseling, chart review, and critical decision making was performed  Plan:   During the course of the visit the patient was educated and counseled about appropriate screening and preventive services including:    Pneumococcal vaccine   Influenza vaccine  Td vaccine  Prevnar 13  Screening electrocardiogram  Screening mammography  Bone densitometry screening  Colorectal cancer screening  Diabetes screening  Glaucoma screening  Nutrition counseling   Advanced directives:  given info/requested copies   Subjective:   Ann [name] is a 86 y.o. female who presents for Medicare Annual Wellness Visit and 3 month follow up for htn, prediabetes, cholesterol, vitamin D deficiency.   She reports new onset of episodes of incontinence intermittently in the last year; she reports was given ? myrbetriq samples to try but stopped taking after a few days after hearing negative things about the medication "dementia." She is not interested in pursuing any further workup or treatment at this time.   BMI is Body mass index is 21.76 kg/m., she has been working on diet and exercise, rides stationary bike 20 min most days.  Wt Readings from Last 3 Encounters:  05/23/19 118 lb (53.5 kg)  02/17/19 119 lb (54 kg)  11/11/18 120 lb (54.4 kg)   Her blood pressure has been controlled at home, today their BP is BP: 126/66  She currently takes telmisartan 20 mg, hctz 25 mg, amlodipine 5 mg BID daily and does well with this. She does workout. She denies chest pain, shortness of breath, dizziness.    She is on cholesterol medication (simvastatin 20 mg daily) and denies myalgias. Her cholesterol is not at goal. The cholesterol last visit was:   Lab Results  Component Value Date   CHOL 202 (H) 02/17/2019   HDL 63 02/17/2019   LDLCALC 115 (H) 02/17/2019   TRIG 125 02/17/2019   CHOLHDL 3.2 02/17/2019    She has been working on diet and exercise for prediabetes, and denies increased appetite, nausea, paresthesia of the feet, polydipsia, polyuria and visual disturbances. Last A1C in the office was:  Lab Results  Component Value Date   HGBA1C 5.8 (H) 02/17/2019    She has CKD IIIa monitored at this office:  Lab Results  Component Value  Date   GFRNONAA 57 (L) 02/17/2019   Patient is on Vitamin D supplement.   Lab Results  Component Value Date   VD25OH 61 02/17/2019       Medication Review Current Outpatient Medications on File Prior to Visit  Medication Sig Dispense Refill   . amLODipine (NORVASC) 5 MG tablet Take 1 tablet 2 x /day ofr  BP 180 tablet 3  . aspirin 81 MG tablet Take 81 mg by mouth daily.    . Biotin 2500 MCG CAPS Take by mouth daily.    . Cholecalciferol (VITAMIN D3) 50 MCG (2000 UT) TABS Taking 6000 units daily 30 tablet   . CINNAMON PO Take 2,000 mg by mouth daily.    . Cyanocobalamin (VITAMIN B-12 PO) 1 tablet daily.    . fish oil-omega-3 fatty acids 1000 MG capsule Take 2 g by mouth daily. 1400 mg    . hydrochlorothiazide (HYDRODIURIL) 25 MG tablet Take 1 tablet Daily for Fluid Retention 90 tablet 3  . olmesartan (BENICAR) 20 MG tablet Take 1 tablet Daily for BP 90 tablet 3  . OVER THE COUNTER MEDICATION Takes vitamin for her eyes 1 daily    . telmisartan (MICARDIS) 20 MG tablet Take 1 tablet daily for BP 90 tablet 1  . TURMERIC PO Take 1,000 mg by mouth 2 (two) times daily.    . simvastatin (ZOCOR) 20 MG tablet Take 1 tablet every Night for Cholestrol 90 tablet 3   No current facility-administered medications on file prior to visit.    Current Problems (verified) Patient Active Problem List   Diagnosis Date Noted  . CKD (chronic kidney disease) stage 3, GFR 30-59 ml/min 05/20/2019  . Vitamin B12 deficiency 12/06/2017  . Osteoporosis 10/15/2015  . Body mass index (BMI) of 20.0-20.9 in adult 03/09/2015  . Prediabetes 01/01/2014  . Medication management 06/27/2013  . Essential hypertension   . Hyperlipidemia, mixed   . Vitamin D deficiency   . Degenerative joint disease   . Anemia of chronic renal failure, stage 3 (moderate)     Screening Tests Immunization History  Administered Date(s) Administered  . Influenza Split 01/17/2013  . Influenza, High Dose Seasonal PF 01/02/2014, 01/16/2015, 01/02/2016, 01/19/2018, 01/05/2019  . Pneumococcal Conjugate-13 06/08/2015  . Pneumococcal Polysaccharide-23 06/04/2010  . Pneumococcal-Unspecified 01/31/1999  . Td 04/01/1995, 12/05/2005, 10/23/2016    Preventative care: Last colonoscopy:   2006 declines another due to age Last mammogram: 02/2018, declines at this time, will get later this year DEXA: 09/2015  Osteoporosis - declines bisphosphonates Declines follow up today  Prior vaccinations: TD or Tdap: 2018  Influenza: 12/2018  Pneumococcal: 2012 Prevnar13: 2013 Shingles/Zostavax: Declined  Names of Other Physician/Practitioners you currently use: 1. [facility]- here for primary care 2. Dr. Herbert Deaner, eye doctor, last visit last visit 2020 3. Dr. Ennis Forts, dentist, last visit 2020, goes q83m  Patient Care Team: Unk Pinto, MD as PCP - General (Internal Medicine)  Allergies Allergies  Allergen Reactions  . Ace Inhibitors     Hyperkalemia     SURGICAL HISTORY She  has no past surgical history on file. FAMILY HISTORY Her family history includes CVA in her father; Cancer in her daughter. SOCIAL HISTORY She  reports that she has never smoked. She has never used smokeless tobacco. She reports current alcohol use of about 1.0 standard drinks of alcohol per week. She reports that she does not use drugs.  MEDICARE WELLNESS OBJECTIVES: Physical activity: Current Exercise Habits: Home exercise routine, Type  of exercise: Other - see comments(rides bicycle), Time (Minutes): 20, Frequency (Times/Week): 6, Weekly Exercise (Minutes/Week): 120, Intensity: Mild Cardiac risk factors: Cardiac Risk Factors include: advanced age (>73men, >6 women);dyslipidemia;hypertension Depression/mood screen:   Depression screen [facility] 2/9 05/23/2019  Decreased Interest 0  Down, Depressed, Hopeless 0  PHQ - 2 Score 0    ADLs:  In your present state of health, do you have any difficulty performing the following activities: 05/23/2019 02/19/2019  Hearing? N N  Vision? N N  Difficulty concentrating or making decisions? N N  Walking or climbing stairs? N N  Dressing or bathing? N N  Doing errands, shopping? N N  Some recent data might be hidden      Cognitive Testing  Alert? Yes  Normal Appearance?Yes  Oriented to person? Yes  Place? Yes   Time? Yes  Recall of three objects?  Yes  Can perform simple calculations? Yes  Displays appropriate judgment?Yes  Can read the correct time from a watch face?Yes  EOL planning: Does Patient Have a Medical Advance Directive?: Yes Type of Advance Directive: Healthcare Power of Attorney, Living will Does patient want to make changes to medical advance directive?: No - Patient declined Copy of Healthcare Power of Attorney in Chart?: No - copy requested   Objective:   Today's Vitals   05/23/19 0959  BP: 126/66  Pulse: 66  Temp: (!) 96.8 F (36 C)  SpO2: 99%  Weight: 118 lb (53.5 kg)   Body mass index is 21.76 kg/m.  General appearance: alert, no distress, WD/WN,  female HEENT: normocephalic, sclerae anicteric, TMs pearly, nares patent, no discharge or erythema, pharynx normal Oral cavity: MMM, no lesions Neck: supple, no lymphadenopathy, no thyromegaly, no masses Heart: RRR, normal S1, S2, no murmurs Lungs: CTA bilaterally, no wheezes, rhonchi, or rales Abdomen: +bs, soft, non tender, non distended, no masses, no hepatomegaly, no splenomegaly Musculoskeletal: nontender, no swelling, no obvious deformity. She has limited ROM to L shoulder, abduction past 90 degrees is limited (per patient ongoing for multiple years, declined surgery) Extremities: no edema, no cyanosis, no clubbing Pulses: 2+ symmetric, upper and lower extremities, normal cap refill Neurological: alert, oriented x 3, CN2-12 intact, strength normal upper extremities and lower extremities, sensation normal throughout, DTRs 2+ throughout, no cerebellar signs, gait normal Psychiatric: normal affect, behavior normal, pleasant  Breast: defer Gyn: defer Rectal: defer   Medicare Attestation I have personally reviewed: The patient's medical and social history Their use of alcohol, tobacco or illicit drugs Their current  medications and supplements The patient's functional ability including ADLs,fall risks, home safety risks, cognitive, and hearing and visual impairment Diet and physical activities Evidence for depression or mood disorders  The patient's weight, height, BMI, and visual acuity have been recorded in the chart.  I have made referrals, counseling, and provided education to the patient based on review of the above and I have provided the patient with a written personalized care plan for preventive services.     Dan Maker, NP   05/23/2019

## 2019-05-23 ENCOUNTER — Other Ambulatory Visit: Payer: Self-pay

## 2019-05-23 ENCOUNTER — Ambulatory Visit (INDEPENDENT_AMBULATORY_CARE_PROVIDER_SITE_OTHER): Payer: PPO | Admitting: Adult Health

## 2019-05-23 ENCOUNTER — Encounter: Payer: Self-pay | Admitting: Adult Health

## 2019-05-23 VITALS — BP 126/66 | HR 66 | Temp 96.8°F | Wt 118.0 lb

## 2019-05-23 DIAGNOSIS — N1831 Chronic kidney disease, stage 3a: Secondary | ICD-10-CM | POA: Diagnosis not present

## 2019-05-23 DIAGNOSIS — Z79899 Other long term (current) drug therapy: Secondary | ICD-10-CM | POA: Diagnosis not present

## 2019-05-23 DIAGNOSIS — E538 Deficiency of other specified B group vitamins: Secondary | ICD-10-CM | POA: Diagnosis not present

## 2019-05-23 DIAGNOSIS — R7303 Prediabetes: Secondary | ICD-10-CM | POA: Diagnosis not present

## 2019-05-23 DIAGNOSIS — E782 Mixed hyperlipidemia: Secondary | ICD-10-CM | POA: Diagnosis not present

## 2019-05-23 DIAGNOSIS — M810 Age-related osteoporosis without current pathological fracture: Secondary | ICD-10-CM

## 2019-05-23 DIAGNOSIS — E559 Vitamin D deficiency, unspecified: Secondary | ICD-10-CM | POA: Diagnosis not present

## 2019-05-23 DIAGNOSIS — D631 Anemia in chronic kidney disease: Secondary | ICD-10-CM | POA: Diagnosis not present

## 2019-05-23 DIAGNOSIS — M8949 Other hypertrophic osteoarthropathy, multiple sites: Secondary | ICD-10-CM | POA: Diagnosis not present

## 2019-05-23 DIAGNOSIS — R6889 Other general symptoms and signs: Secondary | ICD-10-CM

## 2019-05-23 DIAGNOSIS — Z6820 Body mass index (BMI) 20.0-20.9, adult: Secondary | ICD-10-CM

## 2019-05-23 DIAGNOSIS — Z0001 Encounter for general adult medical examination with abnormal findings: Secondary | ICD-10-CM | POA: Diagnosis not present

## 2019-05-23 DIAGNOSIS — R32 Unspecified urinary incontinence: Secondary | ICD-10-CM | POA: Diagnosis not present

## 2019-05-23 DIAGNOSIS — M159 Polyosteoarthritis, unspecified: Secondary | ICD-10-CM

## 2019-05-23 DIAGNOSIS — I10 Essential (primary) hypertension: Secondary | ICD-10-CM | POA: Diagnosis not present

## 2019-05-23 DIAGNOSIS — Z0000 Encounter for general adult medical examination without abnormal findings: Secondary | ICD-10-CM

## 2019-05-23 LAB — COMPLETE METABOLIC PANEL WITH GFR
AG Ratio: 1.9 (calc) (ref 1.0–2.5)
ALT: 9 U/L (ref 6–29)
AST: 16 U/L (ref 10–35)
Albumin: 4.5 g/dL (ref 3.6–5.1)
Alkaline phosphatase (APISO): 58 U/L (ref 37–153)
BUN/Creatinine Ratio: 23 (calc) — ABNORMAL HIGH (ref 6–22)
BUN: 27 mg/dL — ABNORMAL HIGH (ref 7–25)
CO2: 26 mmol/L (ref 20–32)
Calcium: 9.9 mg/dL (ref 8.6–10.4)
Chloride: 103 mmol/L (ref 98–110)
Creat: 1.16 mg/dL — ABNORMAL HIGH (ref 0.60–0.88)
GFR, Est African American: 49 mL/min/{1.73_m2} — ABNORMAL LOW (ref >=60)
GFR, Est Non African American: 43 mL/min/{1.73_m2} — ABNORMAL LOW (ref >=60)
Globulin: 2.4 g/dL (calc) (ref 1.9–3.7)
Glucose, Bld: 98 mg/dL (ref 65–99)
Potassium: 5.1 mmol/L (ref 3.5–5.3)
Sodium: 140 mmol/L (ref 135–146)
Total Bilirubin: 0.4 mg/dL (ref 0.2–1.2)
Total Protein: 6.9 g/dL (ref 6.1–8.1)

## 2019-05-23 LAB — CBC WITH DIFFERENTIAL/PLATELET
Absolute Monocytes: 473 cells/uL (ref 200–950)
Basophils Absolute: 39 cells/uL (ref 0–200)
Basophils Relative: 0.7 %
Eosinophils Absolute: 83 cells/uL (ref 15–500)
Eosinophils Relative: 1.5 %
HCT: 35.4 % (ref 35.0–45.0)
Hemoglobin: 11.2 g/dL — ABNORMAL LOW (ref 11.7–15.5)
Lymphs Abs: 1271 cells/uL (ref 850–3900)
MCH: 27 pg (ref 27.0–33.0)
MCHC: 31.6 g/dL — ABNORMAL LOW (ref 32.0–36.0)
MCV: 85.3 fL (ref 80.0–100.0)
MPV: 11.7 fL (ref 7.5–12.5)
Monocytes Relative: 8.6 %
Neutro Abs: 3636 cells/uL (ref 1500–7800)
Neutrophils Relative %: 66.1 %
Platelets: 226 10*3/uL (ref 140–400)
RBC: 4.15 10*6/uL (ref 3.80–5.10)
RDW: 13.4 % (ref 11.0–15.0)
Total Lymphocyte: 23.1 %
WBC: 5.5 10*3/uL (ref 3.8–10.8)

## 2019-05-23 LAB — LIPID PANEL
Cholesterol: 212 mg/dL — ABNORMAL HIGH (ref <200)
HDL: 65 mg/dL (ref >=50)
LDL Cholesterol (Calc): 125 mg/dL (calc) — ABNORMAL HIGH
Non-HDL Cholesterol (Calc): 147 mg/dL (calc) — ABNORMAL HIGH (ref <130)
Total CHOL/HDL Ratio: 3.3 (calc) (ref <5.0)
Triglycerides: 112 mg/dL (ref <150)

## 2019-05-23 LAB — TSH: TSH: 1.96 mIU/L (ref 0.40–4.50)

## 2019-05-23 LAB — MAGNESIUM: Magnesium: 2.1 mg/dL (ref 1.5–2.5)

## 2019-05-23 NOTE — Patient Instructions (Addendum)
Ann [name] , Thank you for taking time to come for your Medicare Wellness Visit. I appreciate your ongoing commitment to your health goals. Please review the following plan we discussed and let me know if I can assist you in the future.   These are the goals we discussed: Goals    . DIET - INCREASE WATER INTAKE     65+ fluid ounces (4-5 bottles of water)    . Exercise 150 min/wk Moderate Activity     Focus on weight bearing exercises for bone health - squats, jumping, etc.     . HEMOGLOBIN A1C < 5.7    . LDL CALC < 130       This is a list of the screening recommended for you and due dates:  Health Maintenance  Topic Date Due  . Tetanus Vaccine  10/24/2026  . Flu Shot  Completed  . DEXA scan (bone density measurement)  Completed  . Pneumonia vaccines  Completed      Urinary Incontinence  Urinary incontinence refers to a condition in which a person is unable to control where and when to pass urine. A person with this condition will urinate when he or she does not mean to (involuntarily). What are the causes? This condition may be caused by:  Medicines.  Infections.  Constipation.  Overactive bladder muscles.  Weak bladder muscles.  Weak pelvic floor muscles. These muscles provide support for the bladder, intestine, and, in women, the uterus.  Enlarged prostate in men. The prostate is a gland near the bladder. When it gets too big, it can pinch the urethra. With the urethra blocked, the bladder can weaken and lose the ability to empty properly.  Surgery.  Emotional factors, such as anxiety, stress, or post-traumatic stress disorder (PTSD).  Pelvic organ prolapse. This happens in women when organs shift out of place and into the vagina. This shift can prevent the bladder and urethra from working properly. What increases the risk? The following factors may make you more likely to develop this condition:  Older age.  Obesity and physical  inactivity.  Pregnancy and childbirth.  Menopause.  Diseases that affect the nerves or spinal cord (neurological diseases).  Long-term (chronic) coughing. This can increase pressure on the bladder and pelvic floor muscles. What are the signs or symptoms? Symptoms may vary depending on the type of urinary incontinence you have. They include:  A sudden urge to urinate, but passing urine involuntarily before you can get to a bathroom (urge incontinence).  Suddenly passing urine with any activity that forces urine to pass, such as coughing, laughing, exercise, or sneezing (stress incontinence).  Needing to urinate often, but urinating only a small amount, or constantly dribbling urine (overflow incontinence).  Urinating because you cannot get to the bathroom in time due to a physical disability, such as arthritis or injury, or communication and thinking problems, such as Alzheimer disease (functional incontinence). How is this diagnosed? This condition may be diagnosed based on:  Your medical history.  A physical exam.  Tests, such as: ? Urine tests. ? X-rays of your kidney and bladder. ? Ultrasound. ? CT scan. ? Cystoscopy. In this procedure, a health care provider inserts a tube with a light and camera (cystoscope) through the urethra and into the bladder in order to check for problems. ? Urodynamic testing. These tests assess how well the bladder, urethra, and sphincter can store and release urine. There are different types of urodynamic tests, and they vary depending on what  the test is measuring. To help diagnose your condition, your health care provider may recommend that you keep a log of when you urinate and how much you urinate. How is this treated? Treatment for this condition depends on the type of incontinence that you have and its cause. Treatment may include:  Lifestyle changes, such as: ? Quitting smoking. ? Maintaining a healthy weight. ? Staying active. Try to get  150 minutes of moderate-intensity exercise every week. Ask your health care provider which activities are safe for you. ? Eating a healthy diet.  Avoid high-fat foods, like fried foods.  Avoid refined carbohydrates like white bread and white rice.  Limit how much alcohol and caffeine you drink.  Increase your fiber intake. Foods such as fresh fruits, vegetables, beans, and whole grains are healthy sources of fiber.  Pelvic floor muscle exercises.  Bladder training, such as lengthening the amount of time between bathroom breaks, or using the bathroom at regular intervals.  Using techniques to suppress bladder urges. This can include distraction techniques or controlled breathing exercises.  Medicines to relax the bladder muscles and prevent bladder spasms.  Medicines to help slow or prevent the growth of a man's prostate.  Botox injections. These can help relax the bladder muscles.  Using pulses of electricity to help change bladder reflexes (electrical nerve stimulation).  For women, using a medical device to prevent urine leaks. This is a small, tampon-like, disposable device that is inserted into the urethra.  Injecting collagen or carbon beads (bulking agents) into the urinary sphincter. These can help thicken tissue and close the bladder opening.  Surgery. Follow these instructions at home: Lifestyle  Limit alcohol and caffeine. These can fill your bladder quickly and irritate it.  Keep yourself clean to help prevent odors and skin damage. Ask your doctor about special skin creams and cleansers that can protect the skin from urine.  Consider wearing pads or adult diapers. Make sure to change them regularly, and always change them right after experiencing incontinence. General instructions  Take over-the-counter and prescription medicines only as told by your health care provider.  Use the bathroom about every 3-4 hours, even if you do not feel the need to urinate. Try to  empty your bladder completely every time. After urinating, wait a minute. Then try to urinate again.  Make sure you are in a relaxed position while urinating.  If your incontinence is caused by nerve problems, keep a log of the medicines you take and the times you go to the bathroom.  Keep all follow-up visits as told by your health care provider. This is important. Contact a health care provider if:  You have pain that gets worse.  Your incontinence gets worse. Get help right away if:  You have a fever or chills.  You are unable to urinate.  You have redness in your groin area or down your legs. Summary  Urinary incontinence refers to a condition in which a person is unable to control where and when to pass urine.  This condition may be caused by medicines, infection, weak bladder muscles, weak pelvic floor muscles, enlargement of the prostate (in men), or surgery.  The following factors increase your risk for developing this condition: older age, obesity, pregnancy and childbirth, menopause, neurological diseases, and chronic coughing.  There are several types of urinary incontinence. They include urge incontinence, stress incontinence, overflow incontinence, and functional incontinence.  This condition is usually treated first with lifestyle and behavioral changes, such as quitting  smoking, eating a healthier diet, and doing regular pelvic floor exercises. Other treatment options include medicines, bulking agents, medical devices, electrical nerve stimulation, or surgery. This information is not intended to replace advice given to you by your health care provider. Make sure you discuss any questions you have with your health care provider. Document Revised: 03/27/2017 Document Reviewed: 06/26/2016 Elsevier Patient Education  2020 Elsevier Inc.    Mirabegron extended-release tablets What is this medicine? MIRABEGRON (MIR a BEG ron) is used to treat overactive bladder. This  medicine reduces the amount of bathroom visits. It may also help to control wetting accidents. It may be used alone, but sometimes may be given with other treatments. This medicine may be used for other purposes; ask your health care provider or pharmacist if you have questions. COMMON BRAND NAME(S): Myrbetriq What should I tell my health care provider before I take this medicine? They need to know if you have any of these conditions:  high blood pressure  kidney disease  liver disease  problems urinating  prostate disease  an unusual or allergic reaction to mirabegron, other medicines, foods, dyes, or preservatives  pregnant or trying to get pregnant  breast-feeding How should I use this medicine? Take this medicine by mouth with a glass of water. Follow the directions on the prescription label. Do not cut, crush or chew this medicine. You can take it with or without food. If it upsets your stomach, take it with food. Take your medicine at regular intervals. Do not take it more often than directed. Do not stop taking except on your doctor's advice. Talk to your pediatrician regarding the use of this medicine in children. Special care may be needed. Overdosage: If you think you have taken too much of this medicine contact a poison control center or emergency room at once. NOTE: This medicine is only for you. Do not share this medicine with others. What if I miss a dose? If you miss a dose, take it as soon as you can. If it is almost time for your next dose, take only that dose. Do not take double or extra doses. What may interact with this medicine?  codeine  desipramine  digoxin  flecainide  MAOIs like Carbex, Eldepryl, Marplan, Nardil, and Parnate  methadone  metoprolol  pimozide  propafenone  thioridazine  warfarin This list may not describe all possible interactions. Give your health care provider a list of all the medicines, herbs, non-prescription drugs, or  dietary supplements you use. Also tell them if you smoke, drink alcohol, or use illegal drugs. Some items may interact with your medicine. What should I watch for while using this medicine? Visit your doctor or health care professional for regular checks on your progress. Check your blood pressure as directed. Ask your doctor or health care professional what your blood pressure should be and when you should contact him or her. You may need to limit your intake of tea, coffee, caffeinated sodas, or alcohol. These drinks may make your symptoms worse. What side effects may I notice from receiving this medicine? Side effects that you should report to your doctor or health care professional as soon as possible:  allergic reactions like skin rash, itching or hives, swelling of the face, lips, or tongue  high blood pressure  fast, irregular heartbeat  redness, blistering, peeling or loosening of the skin, including inside the mouth  signs of infection like fever or chills; pain or difficulty passing urine  trouble passing urine or  change in the amount of urine Side effects that usually do not require medical attention (report to your doctor or health care professional if they continue or are bothersome):  constipation  dry mouth  headache  runny nose  stomach upset This list may not describe all possible side effects. Call your doctor for medical advice about side effects. You may report side effects to FDA at 1-800-FDA-1088. Where should I keep my medicine? Keep out of the reach of children. Store at room temperature between 15 and 30 degrees C (59 and 86 degrees F). Throw away any unused medicine after the expiration date. NOTE: This sheet is a summary. It may not cover all possible information. If you have questions about this medicine, talk to your doctor, pharmacist, or health care provider.  2020 Elsevier/Gold Standard (2016-08-07 11:33:21)

## 2019-05-24 ENCOUNTER — Other Ambulatory Visit: Payer: Self-pay

## 2019-05-24 ENCOUNTER — Other Ambulatory Visit: Payer: Self-pay | Admitting: Adult Health

## 2019-05-24 MED ORDER — ROSUVASTATIN CALCIUM 5 MG PO TABS: 5.0000 mg | ORAL_TABLET | Freq: Every day | ORAL | 1 refills | Status: DC

## 2019-05-24 MED ORDER — SIMVASTATIN 40 MG PO TABS: ORAL_TABLET | ORAL | 1 refills | Status: DC

## 2019-06-10 DIAGNOSIS — H353132 Nonexudative age-related macular degeneration, bilateral, intermediate dry stage: Secondary | ICD-10-CM | POA: Diagnosis not present

## 2019-06-25 ENCOUNTER — Other Ambulatory Visit: Payer: Self-pay | Admitting: Internal Medicine

## 2019-06-28 ENCOUNTER — Other Ambulatory Visit: Payer: Self-pay

## 2019-07-10 DIAGNOSIS — H353132 Nonexudative age-related macular degeneration, bilateral, intermediate dry stage: Secondary | ICD-10-CM | POA: Diagnosis not present

## 2019-07-24 ENCOUNTER — Other Ambulatory Visit: Payer: Self-pay | Admitting: Adult Health

## 2019-08-09 DIAGNOSIS — H353132 Nonexudative age-related macular degeneration, bilateral, intermediate dry stage: Secondary | ICD-10-CM | POA: Diagnosis not present

## 2019-09-05 ENCOUNTER — Encounter: Payer: Self-pay | Admitting: Internal Medicine

## 2019-09-05 NOTE — Progress Notes (Signed)
History of Present Illness:       This very nice 86 y.o. WWF presents for 6 month follow up with HTN, HLD, Pre-Diabetes and Vitamin D Deficiency. Patient having ongoing issues with stress & urge incontinence. She was intolerant to Myrbetric sx's given 6 months ago        Patient is treated for HTN (1997)  & BP has been controlled at home. Patient has CKD3b (GFR 43) consequent of her HTCVD.  Today's BP is at goal - 116/62. Patient has had no complaints of any cardiac type chest pain, palpitations, dyspnea / orthopnea / PND, dizziness, claudication, or dependent edema.      Hyperlipidemia is not controlled with diet & Rosuivastatin. Patient denies myalgias or other med SE's. Last Lipids were not at goal:  Lab Results  Component Value Date   CHOL 212 (H) 05/23/2019   HDL 65 05/23/2019   LDLCALC 125 (H) 05/23/2019   TRIG 112 05/23/2019   CHOLHDL 3.3 05/23/2019    Also, the patient has history of PreDiabetes(A1c 6.1% / 2010&5.7% / 2016)  and has had no symptoms of reactive hypoglycemia, diabetic polys, paresthesias or visual blurring.  Last A1c was not at goal:  Lab Results  Component Value Date   HGBA1C 5.8 (H) 02/17/2019       Further, the patient also has history of Vitamin D Deficiency ("25" / 2008)  and supplements vitamin D without any suspected side-effects. Last vitamin D was at goal:  Lab Results  Component Value Date   VD25OH 61 02/17/2019    Current Outpatient Medications on File Prior to Visit  Medication Sig  . amLODipine (NORVASC) 5 MG tablet Take 1 tablet 2 x /day ofr  BP  . aspirin 81 MG tablet Take 81 mg by mouth daily.  . Biotin 2500 MCG CAPS Take by mouth daily.  . Cholecalciferol (VITAMIN D3) 50 MCG (2000 UT) TABS Taking 6000 units daily  . CINNAMON PO Take 2,000 mg by mouth daily.  . Cyanocobalamin (VITAMIN B-12 PO) 1 tablet daily.  . fish oil-omega-3 fatty acids 1000 MG capsule Take 2 g by mouth daily. 1400 mg  . hydrochlorothiazide (HYDRODIURIL)  25 MG tablet Take 1 tablet Daily for Fluid Retention  . olmesartan (BENICAR) 20 MG tablet Take 1 tablet Daily for BP  . OVER THE COUNTER MEDICATION Takes vitamin for her eyes 1 daily  . rosuvastatin (CRESTOR) 5 MG tablet Take 1 tablet Daily for Cholesterol  . TURMERIC PO Take 1,000 mg by mouth 2 (two) times daily.  Marland Kitchen telmisartan (MICARDIS) 20 MG tablet Take 1 tablet daily for BP   No current facility-administered medications on file prior to visit.    Allergies  Allergen Reactions  . Ace Inhibitors     Hyperkalemia     PMHx:   Past Medical History:  Diagnosis Date  . Anemia of chronic renal failure   . Chronic renal insufficiency   . Degenerative joint disease   . HTN (hypertension)   . Hyperlipidemia   . Shingles   . Vitamin D deficiency     Immunization History  Administered Date(s) Administered  . Influenza Split 01/17/2013  . Influenza, High Dose Seasonal PF 01/02/2014, 01/16/2015, 01/02/2016, 01/19/2018, 01/05/2019  . Pneumococcal Conjugate-13 06/08/2015  . Pneumococcal Polysaccharide-23 06/04/2010  . Pneumococcal-Unspecified 01/31/1999  . Td 04/01/1995, 12/05/2005, 10/23/2016    History reviewed. No pertinent surgical history.  FHx:    Reviewed / unchanged  SHx:    Reviewed /  unchanged   Systems Review:  Constitutional: Denies fever, chills, wt changes, headaches, insomnia, fatigue, night sweats, change in appetite. Eyes: Denies redness, blurred vision, diplopia, discharge, itchy, watery eyes.  ENT: Denies discharge, congestion, post nasal drip, epistaxis, sore throat, earache, hearing loss, dental pain, tinnitus, vertigo, sinus pain, snoring.  CV: Denies chest pain, palpitations, irregular heartbeat, syncope, dyspnea, diaphoresis, orthopnea, PND, claudication or edema. Respiratory: denies cough, dyspnea, DOE, pleurisy, hoarseness, laryngitis, wheezing.  Gastrointestinal: Denies dysphagia, odynophagia, heartburn, reflux, water brash, abdominal pain or cramps,  nausea, vomiting, bloating, diarrhea, constipation, hematemesis, melena, hematochezia  or hemorrhoids. Genitourinary: Denies dysuria, frequency, urgency, nocturia, hesitancy, discharge, hematuria or flank pain. Musculoskeletal: Denies arthralgias, myalgias, stiffness, jt. swelling, pain, limping or strain/sprain.  Skin: Denies pruritus, rash, hives, warts, acne, eczema or change in skin lesion(s). Neuro: No weakness, tremor, incoordination, spasms, paresthesia or pain. Psychiatric: Denies confusion, memory loss or sensory loss. Endo: Denies change in weight, skin or hair change.  Heme/Lymph: No excessive bleeding, bruising or enlarged lymph nodes.  Physical Exam  BP 116/62   Pulse 60   Temp (!) 97.2 F (36.2 C)   Resp 16   Ht 5' 1.75" (1.568 m)   Wt 117 lb 12.8 oz (53.4 kg)   BMI 21.72 kg/m   Appears  well nourished, well groomed  and in no distress.  Eyes: PERRLA, EOMs, conjunctiva no swelling or erythema. Sinuses: No frontal/maxillary tenderness ENT/Mouth: EAC's clear, TM's nl w/o erythema, bulging. Nares clear w/o erythema, swelling, exudates. Oropharynx clear without erythema or exudates. Oral hygiene is good. Tongue normal, non obstructing. Hearing intact.  Neck: Supple. Thyroid not palpable. Car 2+/2+ without bruits, nodes or JVD. Chest: Respirations nl with BS clear & equal w/o rales, rhonchi, wheezing or stridor.  Cor: Heart sounds normal w/ regular rate and rhythm without sig. murmurs, gallops, clicks or rubs. Peripheral pulses normal and equal  without edema.  Abdomen: Soft & bowel sounds normal. Non-tender w/o guarding, rebound, hernias, masses or organomegaly.  Lymphatics: Unremarkable.  Musculoskeletal: Full ROM all peripheral extremities, joint stability, 5/5 strength and normal gait.  Skin: Warm, dry without exposed rashes, lesions or ecchymosis apparent.  Neuro: Cranial nerves intact, reflexes equal bilaterally. Sensory-motor testing grossly intact. Tendon reflexes  grossly intact.  Pysch: Alert & oriented x 3.  Insight and judgement nl & appropriate. No ideations.  Assessment and Plan:  1. Essential hypertension  - Continue medication, monitor blood pressure at home.  - Continue DASH diet.  Reminder to go to the ER if any CP,  SOB, nausea, dizziness, severe HA, changes vision/speech.  - CBC with Differential/Platelet - COMPLETE METABOLIC PANEL WITH GFR - Magnesium - TSH  2. Hyperlipidemia, mixed   - Continue diet/meds, exercise,& lifestyle modifications.  - Continue monitor periodic cholesterol/liver & renal functions   - Lipid panel - TSH  3. Abnormal glucose  - Continue diet, exercise  - Lifestyle modifications.  - Monitor appropriate labs.  - Hemoglobin A1c - Insulin, random  4. Vitamin D deficiency  - Continue supplementation.  - VITAMIN D 25 Hydroxy  5. Medication management  - CBC with Differential/Platelet - COMPLETE METABOLIC PANEL WITH GFR - Magnesium - Lipid panel - TSH - Hemoglobin A1c - Insulin, random - VITAMIN D 25 Hydroxy        Discussed  regular exercise, BP monitoring, weight control to achieve/maintain BMI less than 25 and discussed med and SE's. Recommended labs to assess and monitor clinical status with further disposition pending results of labs.  I  discussed the assessment and treatment plan with the patient. The patient was provided an opportunity to ask questions and all were answered. The patient agreed with the plan and demonstrated an understanding of the instructions.  I provided over 30 minutes of exam, counseling, chart review and  complex critical decision making.         Given Rx Oxybutynin 10 mg ER. The patient was advised to call back or seek an in-person evaluation if the symptoms worsen or if the condition fails to improve as anticipated.   Marinus Maw, MD

## 2019-09-05 NOTE — Patient Instructions (Signed)

## 2019-09-06 ENCOUNTER — Other Ambulatory Visit: Payer: Self-pay

## 2019-09-06 ENCOUNTER — Ambulatory Visit (INDEPENDENT_AMBULATORY_CARE_PROVIDER_SITE_OTHER): Payer: PPO | Admitting: Internal Medicine

## 2019-09-06 VITALS — BP 116/62 | HR 60 | Temp 97.2°F | Resp 16 | Ht 61.75 in | Wt 117.8 lb

## 2019-09-06 DIAGNOSIS — N3942 Incontinence without sensory awareness: Secondary | ICD-10-CM

## 2019-09-06 DIAGNOSIS — E559 Vitamin D deficiency, unspecified: Secondary | ICD-10-CM

## 2019-09-06 DIAGNOSIS — E782 Mixed hyperlipidemia: Secondary | ICD-10-CM | POA: Diagnosis not present

## 2019-09-06 DIAGNOSIS — Z79899 Other long term (current) drug therapy: Secondary | ICD-10-CM | POA: Diagnosis not present

## 2019-09-06 DIAGNOSIS — R7309 Other abnormal glucose: Secondary | ICD-10-CM

## 2019-09-06 DIAGNOSIS — I10 Essential (primary) hypertension: Secondary | ICD-10-CM | POA: Diagnosis not present

## 2019-09-06 MED ORDER — OXYBUTYNIN CHLORIDE ER 10 MG PO TB24: ORAL_TABLET | ORAL | 0 refills | Status: DC

## 2019-09-07 LAB — COMPLETE METABOLIC PANEL WITH GFR
AG Ratio: 1.9 (calc) (ref 1.0–2.5)
ALT: 10 U/L (ref 6–29)
AST: 17 U/L (ref 10–35)
Albumin: 4.5 g/dL (ref 3.6–5.1)
Alkaline phosphatase (APISO): 71 U/L (ref 37–153)
BUN/Creatinine Ratio: 22 (calc) (ref 6–22)
BUN: 24 mg/dL (ref 7–25)
CO2: 27 mmol/L (ref 20–32)
Calcium: 10.4 mg/dL (ref 8.6–10.4)
Chloride: 103 mmol/L (ref 98–110)
Creat: 1.09 mg/dL — ABNORMAL HIGH (ref 0.60–0.88)
GFR, Est African American: 53 mL/min/{1.73_m2} — ABNORMAL LOW (ref >=60)
GFR, Est Non African American: 46 mL/min/{1.73_m2} — ABNORMAL LOW (ref >=60)
Globulin: 2.4 g/dL (calc) (ref 1.9–3.7)
Glucose, Bld: 107 mg/dL — ABNORMAL HIGH (ref 65–99)
Potassium: 5.8 mmol/L — ABNORMAL HIGH (ref 3.5–5.3)
Sodium: 142 mmol/L (ref 135–146)
Total Bilirubin: 0.4 mg/dL (ref 0.2–1.2)
Total Protein: 6.9 g/dL (ref 6.1–8.1)

## 2019-09-07 LAB — CBC WITH DIFFERENTIAL/PLATELET
Absolute Monocytes: 377 cells/uL (ref 200–950)
Basophils Absolute: 39 cells/uL (ref 0–200)
Basophils Relative: 0.8 %
Eosinophils Absolute: 88 cells/uL (ref 15–500)
Eosinophils Relative: 1.8 %
HCT: 34.6 % — ABNORMAL LOW (ref 35.0–45.0)
Hemoglobin: 10.6 g/dL — ABNORMAL LOW (ref 11.7–15.5)
Lymphs Abs: 1343 cells/uL (ref 850–3900)
MCH: 26.9 pg — ABNORMAL LOW (ref 27.0–33.0)
MCHC: 30.6 g/dL — ABNORMAL LOW (ref 32.0–36.0)
MCV: 87.8 fL (ref 80.0–100.0)
MPV: 12.2 fL (ref 7.5–12.5)
Monocytes Relative: 7.7 %
Neutro Abs: 3053 cells/uL (ref 1500–7800)
Neutrophils Relative %: 62.3 %
Platelets: 202 10*3/uL (ref 140–400)
RBC: 3.94 10*6/uL (ref 3.80–5.10)
RDW: 13.1 % (ref 11.0–15.0)
Total Lymphocyte: 27.4 %
WBC: 4.9 10*3/uL (ref 3.8–10.8)

## 2019-09-07 LAB — HEMOGLOBIN A1C
Hgb A1c MFr Bld: 5.7 % of total Hgb — ABNORMAL HIGH (ref <5.7)
Mean Plasma Glucose: 117 (calc)
eAG (mmol/L): 6.5 (calc)

## 2019-09-07 LAB — LIPID PANEL
Cholesterol: 193 mg/dL (ref <200)
HDL: 69 mg/dL (ref >=50)
LDL Cholesterol (Calc): 105 mg/dL (calc) — ABNORMAL HIGH
Non-HDL Cholesterol (Calc): 124 mg/dL (calc) (ref <130)
Total CHOL/HDL Ratio: 2.8 (calc) (ref <5.0)
Triglycerides: 99 mg/dL (ref <150)

## 2019-09-07 LAB — MAGNESIUM: Magnesium: 2.3 mg/dL (ref 1.5–2.5)

## 2019-09-07 LAB — TSH: TSH: 1.9 mIU/L (ref 0.40–4.50)

## 2019-09-07 LAB — VITAMIN D 25 HYDROXY (VIT D DEFICIENCY, FRACTURES): Vit D, 25-Hydroxy: 62 ng/mL (ref 30–100)

## 2019-09-07 LAB — INSULIN, RANDOM: Insulin: 6.5 u[IU]/mL

## 2019-09-08 DIAGNOSIS — H353132 Nonexudative age-related macular degeneration, bilateral, intermediate dry stage: Secondary | ICD-10-CM | POA: Diagnosis not present

## 2019-09-13 ENCOUNTER — Other Ambulatory Visit: Payer: Self-pay | Admitting: Internal Medicine

## 2019-09-13 DIAGNOSIS — Z1231 Encounter for screening mammogram for malignant neoplasm of breast: Secondary | ICD-10-CM

## 2019-10-08 DIAGNOSIS — H353132 Nonexudative age-related macular degeneration, bilateral, intermediate dry stage: Secondary | ICD-10-CM | POA: Diagnosis not present

## 2019-10-12 ENCOUNTER — Ambulatory Visit
Admission: RE | Admit: 2019-10-12 | Discharge: 2019-10-12 | Disposition: A | Discharge status: Home / Self Care | Payer: PPO | Source: Ambulatory Visit | Attending: Internal Medicine | Admitting: Internal Medicine

## 2019-10-12 ENCOUNTER — Other Ambulatory Visit: Payer: Self-pay

## 2019-10-12 DIAGNOSIS — Z1231 Encounter for screening mammogram for malignant neoplasm of breast: Secondary | ICD-10-CM | POA: Diagnosis not present

## 2019-11-07 DIAGNOSIS — H353132 Nonexudative age-related macular degeneration, bilateral, intermediate dry stage: Secondary | ICD-10-CM | POA: Diagnosis not present

## 2019-11-08 ENCOUNTER — Other Ambulatory Visit: Payer: Self-pay | Admitting: Internal Medicine

## 2019-12-03 ENCOUNTER — Other Ambulatory Visit: Payer: Self-pay | Admitting: Internal Medicine

## 2019-12-03 DIAGNOSIS — N3942 Incontinence without sensory awareness: Secondary | ICD-10-CM

## 2019-12-04 ENCOUNTER — Other Ambulatory Visit: Payer: Self-pay | Admitting: Internal Medicine

## 2019-12-06 NOTE — Progress Notes (Signed)
FOLLOW UP  Assessment and Plan:   Hypertension Well controlled with current medications  Monitor blood pressure at home; patient to call if consistently greater than 130/80 Continue DASH diet.   Reminder to go to the ER if any CP, SOB, nausea, dizziness, severe HA, changes vision/speech, left arm numbness and tingling and jaw pain.  Cholesterol Currently above goal; not treated aggressively by medication due to age Continue to encourage low cholesterol diet and exercise.  Check lipid panel.   Prediabetes Continue diet and exercise.  Perform daily foot/skin check, notify office of any concerning changes.  Check A1C  CKD 3 Increase fluids, avoid NSAIDS, monitor sugars, will monitor  Vitamin D Def Below goal at last visit; continue supplementation to maintain goal of 70-100 Check Vit D level  Continue diet and meds as discussed. Further disposition pending results of labs. Discussed med's effects and SE's.   Over 30 minutes of exam, counseling, chart review, and critical decision making was performed.   Future Appointments  Date Time Provider Department Center  03/19/2020 11:00 AM Lucky Cowboy, MD [facility] None  05/24/2020 10:00 AM Judd Gaudier, NP [facility] None    ----------------------------------------------------------------------------------------------------------------------  HPI 86 y.o. female  presents for 3 month follow up on hypertension, cholesterol, prediabetes, weight and vitamin D deficiency.   BMI is Body mass index is 22.35 kg/m., she has been working on diet and exercise, does strength exercises daily, does yard work, stationary bike some days [employer] from Last 3 Encounters:  12/07/19 121 lb 3.2 oz (55 kg)  09/06/19 117 lb 12.8 oz (53.4 kg)  05/23/19 118 lb (53.5 kg)   Her blood pressure has been controlled at home, today their BP is BP: 130/68  She does workout. She denies chest pain, shortness of breath, dizziness.   She is on  cholesterol medication, simvastatin 20 mg daily, also on omega 3. Her cholesterol is not at goal. The cholesterol last visit was:   Lab Results  Component Value Date   CHOL 193 09/06/2019   HDL 69 09/06/2019   LDLCALC 105 (H) 09/06/2019   TRIG 99 09/06/2019   CHOLHDL 2.8 09/06/2019    She has been working on diet and exercise for prediabetes, and denies foot ulcerations, increased appetite, nausea, paresthesia of the feet, polydipsia, polyuria, visual disturbances, vomiting and weight loss. Last A1C in the office was:  Lab Results  Component Value Date   HGBA1C 5.7 (H) 09/06/2019    She has CKD 3 and consequent anemia of chronic disease:  Lab Results  Component Value Date   GFRNONAA 46 (L) 09/06/2019   Lab Results  Component Value Date   WBC 4.9 09/06/2019   HGB 10.6 (L) 09/06/2019   HCT 34.6 (L) 09/06/2019   MCV 87.8 09/06/2019   PLT 202 09/06/2019   Patient is on Vitamin D supplement and near goal at recent check:   Lab Results  Component Value Date   VD25OH 62 09/06/2019      Current Medications:  Current Outpatient Medications on File Prior to Visit  Medication Sig  . amLODipine (NORVASC) 5 MG tablet Take 1 tablet 2 x /day ofr  BP  . aspirin 81 MG tablet Take 81 mg by mouth daily.  . Biotin 2500 MCG CAPS Take by mouth daily.  . Cholecalciferol (VITAMIN D3) 50 MCG (2000 UT) TABS Taking 6000 units daily  . CINNAMON PO Take 2,000 mg by mouth daily.  . Cyanocobalamin (VITAMIN B-12 PO) 1 tablet daily.  . fish oil-omega-3  fatty acids 1000 MG capsule Take 2 g by mouth daily. 1400 mg  . hydrochlorothiazide (HYDRODIURIL) 25 MG tablet Take 1 tablet Daily for Fluid Retention  . olmesartan (BENICAR) 20 MG tablet Take   1 tablet     Daily     for BP  . OVER THE COUNTER MEDICATION Takes vitamin for her eyes 1 daily  . oxybutynin (DITROPAN-XL) 10 MG 24 hr tablet Take 1 tablet    Daily     for Bladder Control  . rosuvastatin (CRESTOR) 5 MG tablet TAKE ONE TABLET DAILY FOR  CHOLESTEROL  . TURMERIC PO Take 1,000 mg by mouth 2 (two) times daily.   No current facility-administered medications on file prior to visit.     Allergies:  Allergies  Allergen Reactions  . Ace Inhibitors     Hyperkalemia      Medical History:  Past Medical History:  Diagnosis Date  . Anemia of chronic renal failure   . Chronic renal insufficiency   . Degenerative joint disease   . HTN (hypertension)   . Hyperlipidemia   . Shingles   . Vitamin D deficiency    Family history- Reviewed and unchanged Social history- Reviewed and unchanged   Review of Systems:  Review of Systems  Constitutional: Negative for malaise/fatigue and weight loss.  HENT: Negative for hearing loss and tinnitus.   Eyes: Negative for blurred vision and double vision.  Respiratory: Negative for cough, shortness of breath and wheezing.   Cardiovascular: Negative for chest pain, palpitations, orthopnea, claudication and leg swelling.  Gastrointestinal: Negative for abdominal pain, blood in stool, constipation, diarrhea, heartburn, melena, nausea and vomiting.  Genitourinary: Negative.   Musculoskeletal: Negative for joint pain and myalgias.  Skin: Negative for rash.  Neurological: Negative for dizziness, tingling, sensory change, weakness and headaches.  Endo/Heme/Allergies: Negative for polydipsia.  Psychiatric/Behavioral: Negative.   All other systems reviewed and are negative.     Physical Exam: BP 130/68   Pulse 69   Temp (!) 97.5 F (36.4 C)   Wt 121 lb 3.2 oz (55 kg)   SpO2 99%   BMI 22.35 kg/m  Wt Readings from Last 3 Encounters:  12/07/19 121 lb 3.2 oz (55 kg)  09/06/19 117 lb 12.8 oz (53.4 kg)  05/23/19 118 lb (53.5 kg)   General Appearance: Well nourished, in no apparent distress. Eyes: PERRLA, EOMs, conjunctiva no swelling or erythema Sinuses: No Frontal/maxillary tenderness ENT/Mouth: Ext aud canals clear, TMs without erythema, bulging. No erythema, swelling, or exudate  on post pharynx.  Tonsils not swollen or erythematous. Hearing normal.  Neck: Supple, thyroid normal.  Respiratory: Respiratory effort normal, BS equal bilaterally without rales, rhonchi, wheezing or stridor.  Cardio: RRR with no MRGs. Brisk peripheral pulses without edema.  Abdomen: Soft, + BS.  Non tender, no guarding, rebound, hernias, masses. Lymphatics: Non tender without lymphadenopathy.  Musculoskeletal: Full ROM, 5/5 strength, Normal gait Skin: Warm, dry without rashes, lesions, ecchymosis.  Neuro: Cranial nerves intact. No cerebellar symptoms.  Psych: Awake and oriented X 3, normal affect, Insight and Judgment appropriate.    Quentin Mulling, PA-C 10:06 AM [facility]

## 2019-12-07 ENCOUNTER — Ambulatory Visit (INDEPENDENT_AMBULATORY_CARE_PROVIDER_SITE_OTHER): Payer: PPO | Admitting: Physician Assistant

## 2019-12-07 ENCOUNTER — Encounter: Payer: Self-pay | Admitting: Physician Assistant

## 2019-12-07 ENCOUNTER — Other Ambulatory Visit: Payer: Self-pay

## 2019-12-07 VITALS — BP 130/68 | HR 69 | Temp 97.5°F | Wt 121.2 lb

## 2019-12-07 DIAGNOSIS — N1831 Chronic kidney disease, stage 3a: Secondary | ICD-10-CM | POA: Diagnosis not present

## 2019-12-07 DIAGNOSIS — I10 Essential (primary) hypertension: Secondary | ICD-10-CM

## 2019-12-07 DIAGNOSIS — E782 Mixed hyperlipidemia: Secondary | ICD-10-CM

## 2019-12-07 DIAGNOSIS — Z79899 Other long term (current) drug therapy: Secondary | ICD-10-CM

## 2019-12-07 DIAGNOSIS — D631 Anemia in chronic kidney disease: Secondary | ICD-10-CM | POA: Diagnosis not present

## 2019-12-07 DIAGNOSIS — D649 Anemia, unspecified: Secondary | ICD-10-CM | POA: Diagnosis not present

## 2019-12-07 DIAGNOSIS — E559 Vitamin D deficiency, unspecified: Secondary | ICD-10-CM | POA: Diagnosis not present

## 2019-12-07 DIAGNOSIS — H353132 Nonexudative age-related macular degeneration, bilateral, intermediate dry stage: Secondary | ICD-10-CM | POA: Diagnosis not present

## 2019-12-07 DIAGNOSIS — R7309 Other abnormal glucose: Secondary | ICD-10-CM

## 2019-12-07 MED ORDER — AMLODIPINE BESYLATE 5 MG PO TABS: ORAL_TABLET | ORAL | 3 refills | Status: DC

## 2019-12-09 LAB — COMPLETE METABOLIC PANEL WITH GFR
AG Ratio: 1.8 (calc) (ref 1.0–2.5)
ALT: 8 U/L (ref 6–29)
AST: 16 U/L (ref 10–35)
Albumin: 4.4 g/dL (ref 3.6–5.1)
Alkaline phosphatase (APISO): 68 U/L (ref 37–153)
BUN/Creatinine Ratio: 23 (calc) — ABNORMAL HIGH (ref 6–22)
BUN: 24 mg/dL (ref 7–25)
CO2: 26 mmol/L (ref 20–32)
Calcium: 9.9 mg/dL (ref 8.6–10.4)
Chloride: 106 mmol/L (ref 98–110)
Creat: 1.04 mg/dL — ABNORMAL HIGH (ref 0.60–0.88)
GFR, Est African American: 56 mL/min/{1.73_m2} — ABNORMAL LOW (ref >=60)
GFR, Est Non African American: 49 mL/min/{1.73_m2} — ABNORMAL LOW (ref >=60)
Globulin: 2.4 g/dL (calc) (ref 1.9–3.7)
Glucose, Bld: 97 mg/dL (ref 65–99)
Potassium: 5.6 mmol/L — ABNORMAL HIGH (ref 3.5–5.3)
Sodium: 141 mmol/L (ref 135–146)
Total Bilirubin: 0.4 mg/dL (ref 0.2–1.2)
Total Protein: 6.8 g/dL (ref 6.1–8.1)

## 2019-12-09 LAB — CBC WITH DIFFERENTIAL/PLATELET
Absolute Monocytes: 340 cells/uL (ref 200–950)
Basophils Absolute: 39 cells/uL (ref 0–200)
Basophils Relative: 0.9 %
Eosinophils Absolute: 52 cells/uL (ref 15–500)
Eosinophils Relative: 1.2 %
HCT: 33.2 % — ABNORMAL LOW (ref 35.0–45.0)
Hemoglobin: 10.5 g/dL — ABNORMAL LOW (ref 11.7–15.5)
Lymphs Abs: 1303 cells/uL (ref 850–3900)
MCH: 27.3 pg (ref 27.0–33.0)
MCHC: 31.6 g/dL — ABNORMAL LOW (ref 32.0–36.0)
MCV: 86.2 fL (ref 80.0–100.0)
MPV: 12.6 fL — ABNORMAL HIGH (ref 7.5–12.5)
Monocytes Relative: 7.9 %
Neutro Abs: 2567 cells/uL (ref 1500–7800)
Neutrophils Relative %: 59.7 %
Platelets: 194 10*3/uL (ref 140–400)
RBC: 3.85 10*6/uL (ref 3.80–5.10)
RDW: 13.6 % (ref 11.0–15.0)
Total Lymphocyte: 30.3 %
WBC: 4.3 10*3/uL (ref 3.8–10.8)

## 2019-12-09 LAB — TEST AUTHORIZATION

## 2019-12-09 LAB — LIPID PANEL
Cholesterol: 180 mg/dL (ref <200)
HDL: 63 mg/dL (ref >=50)
LDL Cholesterol (Calc): 96 mg/dL (calc)
Non-HDL Cholesterol (Calc): 117 mg/dL (calc) (ref <130)
Total CHOL/HDL Ratio: 2.9 (calc) (ref <5.0)
Triglycerides: 112 mg/dL (ref <150)

## 2019-12-09 LAB — TSH: TSH: 1.8 mIU/L (ref 0.40–4.50)

## 2019-12-09 LAB — FERRITIN: Ferritin: 76 ng/mL (ref 16–288)

## 2019-12-09 LAB — IRON: Iron: 89 ug/dL (ref 45–160)

## 2019-12-09 LAB — HEMOGLOBIN A1C
Hgb A1c MFr Bld: 5.8 % of total Hgb — ABNORMAL HIGH (ref <5.7)
Mean Plasma Glucose: 120 (calc)
eAG (mmol/L): 6.6 (calc)

## 2019-12-09 LAB — MAGNESIUM: Magnesium: 2 mg/dL (ref 1.5–2.5)

## 2020-01-06 DIAGNOSIS — H353132 Nonexudative age-related macular degeneration, bilateral, intermediate dry stage: Secondary | ICD-10-CM | POA: Diagnosis not present

## 2020-01-10 ENCOUNTER — Other Ambulatory Visit: Payer: Self-pay

## 2020-01-10 ENCOUNTER — Ambulatory Visit (INDEPENDENT_AMBULATORY_CARE_PROVIDER_SITE_OTHER): Payer: PPO

## 2020-01-10 VITALS — Temp 97.3°F

## 2020-01-10 DIAGNOSIS — Z23 Encounter for immunization: Secondary | ICD-10-CM

## 2020-02-04 ENCOUNTER — Other Ambulatory Visit: Payer: Self-pay | Admitting: Internal Medicine

## 2020-02-05 DIAGNOSIS — H353132 Nonexudative age-related macular degeneration, bilateral, intermediate dry stage: Secondary | ICD-10-CM | POA: Diagnosis not present

## 2020-03-05 ENCOUNTER — Other Ambulatory Visit: Payer: Self-pay | Admitting: Internal Medicine

## 2020-03-06 DIAGNOSIS — H353132 Nonexudative age-related macular degeneration, bilateral, intermediate dry stage: Secondary | ICD-10-CM | POA: Diagnosis not present

## 2020-03-18 ENCOUNTER — Encounter: Payer: Self-pay | Admitting: Internal Medicine

## 2020-03-18 NOTE — Patient Instructions (Signed)
+++++++++++++++++++++++++++++++++++++++++++++++ S T O P      B I O T I N  +++++++++++++++++++++++++++++++++++++++++++++++ Due to recent changes in healthcare laws, you may see the results of your imaging and laboratory studies on MyChart before your provider has had a chance to review them.  We understand that in some cases there may be results that are confusing or concerning to you. Not all laboratory results come back in the same time frame and the provider may be waiting for multiple results in order to interpret others.  Please give us 48 hours in order for your provider to thoroughly review all the results before contacting the office for clarification of your results.     Vit D  & Vit C 1,000 mg   are recommended to help protect  against the Covid-19 and other Corona viruses.    Also it's recommended  to take  Zinc 50 mg  to help  protect against the Covid-19   and best place to get  is also on [employer].com  and don't pay more than 6-8 cents /pill !  ================================ Coronavirus (COVID-19) Are you at risk?  Are you at risk for the Coronavirus (COVID-19)?  To be considered HIGH RISK for Coronavirus (COVID-19), you have to meet the following criteria:  . Traveled to [place], [place], [place], [place] or [place]; or in the [place] to [place], [place], [place]  . or [place]; and have fever, cough, and shortness of breath within the last 2 weeks of travel OR . Been in close contact with a person diagnosed with COVID-19 within the last 2 weeks and have  . fever, cough,and shortness of breath .  . IF YOU DO NOT MEET THESE CRITERIA, YOU ARE CONSIDERED LOW RISK FOR COVID-19.  What to do if you are HIGH RISK for COVID-19?  . If you are having a medical emergency, call 911. . Seek medical care right away. Before you go to a doctor's office, urgent care or emergency department, .  call ahead and tell them about your recent travel, contact with someone  diagnosed with COVID-19  .  and your symptoms.  . You should receive instructions from your physician's office regarding next steps of care.  . When you arrive at healthcare provider, tell the healthcare staff immediately you have returned from  . visiting [place], [place], [place], [place] or [place]; or traveled in the [place] to [place], [place],  . [place] or [place] in the last two weeks or you have been in close contact with a person diagnosed with  . COVID-19 in the last 2 weeks.   . Tell the health care staff about your symptoms: fever, cough and shortness of breath. . After you have been seen by a medical provider, you will be either: o Tested for (COVID-19) and discharged home on quarantine except to seek medical care if  o symptoms worsen, and asked to  - Stay home and avoid contact with others until you get your results (4-5 days)  - Avoid travel on public transportation if possible (such as bus, train, or airplane) or o Sent to the Emergency Department by EMS for evaluation, COVID-19 testing  and  o possible admission depending on your condition and test results.  What to do if you are LOW RISK for COVID-19?  Reduce your risk of any infection by using the same precautions used for avoiding the common cold or flu:  . Wash your hands often   with soap and warm water for at least 20 seconds.  If soap and water are not readily available,  . use an alcohol-based hand sanitizer with at least 60% alcohol.  . If coughing or sneezing, cover your mouth and nose by coughing or sneezing into the elbow areas of your shirt or coat, .  into a tissue or into your sleeve (not your hands). . Avoid shaking hands with others and consider head nods or verbal greetings only. . Avoid touching your eyes, nose, or mouth with unwashed hands.  . Avoid close contact with people who are sick. . Avoid places or events with large numbers of people in one location, like concerts or sporting  events. . Carefully consider travel plans you have or are making. . If you are planning any travel outside or inside the [place], visit the CDC's Travelers' Health webpage for the latest health notices. . If you have some symptoms but not all symptoms, continue to monitor at home and seek medical attention  . if your symptoms worsen. . If you are having a medical emergency, call 911.   . >>>>>>>>>>>>>>>>>>>>>>>>>>>>>>>>> . We Do NOT Approve of  [facility] [place] Soliciting Our Patients  To Do Home Visits & We Do NOT Approve of [employer] > > > > > > > > > > > > > > > > > > > > > > > > > > > > > > > > > > > > > > >  Preventive Care for Adults  A healthy lifestyle and preventive care can promote health and wellness. Preventive health guidelines for women include the following key practices.  A routine yearly physical is a good way to check with your health care provider about your health and preventive screening. It is a chance to share any concerns and updates on your health and to receive a thorough exam.  Visit your dentist for a routine exam and preventive care every 6 months. Brush your teeth twice a day and floss once a day. Good oral hygiene prevents tooth decay and gum disease.  The frequency of eye exams is based on your age, health, family medical history, use of contact lenses, and other factors. Follow your health care provider's recommendations for frequency of eye exams.  Eat a healthy diet. Foods like vegetables, fruits, whole grains, low-fat dairy products, and lean protein foods contain the nutrients you need without too many calories. Decrease your intake of foods high in solid fats, added sugars, and salt. Eat the right amount of calories for you. Get information about a proper diet from your health care provider, if necessary.  Regular physical exercise is one of the most important things you can do for your health. Most adults should get at least 150  minutes of moderate-intensity exercise (any activity that increases your heart rate and causes you to sweat) each week. In addition, most adults need muscle-strengthening exercises on 2 or more days a week.  Maintain a healthy weight. The body mass index (BMI) is a screening tool to identify possible weight problems. It provides an estimate of body fat based on height and weight. Your health care provider can find your BMI and can help you achieve or maintain a healthy weight. For adults 20 years and older:  A BMI below 18.5 is considered underweight.  A BMI of 18.5 to 24.9 is normal.  A BMI of 25 to 29.9 is considered overweight.  A BMI of 30 and above   is considered obese.  Maintain normal blood lipids and cholesterol levels by exercising and minimizing your intake of saturated fat. Eat a balanced diet with plenty of fruit and vegetables. If your lipid or cholesterol levels are high, you are over 50, or you are at high risk for heart disease, you may need your cholesterol levels checked more frequently. Ongoing high lipid and cholesterol levels should be treated with medicines if diet and exercise are not working.  If you smoke, find out from your health care provider how to quit. If you do not use tobacco, do not start.  Lung cancer screening is recommended for adults aged 55-80 years who are at high risk for developing lung cancer because of a history of smoking. A yearly low-dose CT scan of the lungs is recommended for people who have at least a 30-pack-year history of smoking and are a current smoker or have quit within the past 15 years. A pack year of smoking is smoking an average of 1 pack of cigarettes a day for 1 year (for example: 1 pack a day for 30 years or 2 packs a day for 15 years). Yearly screening should continue until the smoker has stopped smoking for at least 15 years. Yearly screening should be stopped for people who develop a health problem that would prevent them from having  lung cancer treatment.  Avoid use of street drugs. Do not share needles with anyone. Ask for help if you need support or instructions about stopping the use of drugs.  High blood pressure causes heart disease and increases the risk of stroke.  Ongoing high blood pressure should be treated with medicines if weight loss and exercise do not work.  If you are 55-79 years old, ask your health care provider if you should take aspirin to prevent strokes.  Diabetes screening involves taking a blood sample to check your fasting blood sugar level. This should be done once every 3 years, after age 45, if you are within normal weight and without risk factors for diabetes. Testing should be considered at a younger age or be carried out more frequently if you are overweight and have at least 1 risk factor for diabetes.  Breast cancer screening is essential preventive care for women. You should practice "breast self-awareness." This means understanding the normal appearance and feel of your breasts and may include breast self-examination. Any changes detected, no matter how small, should be reported to a health care provider. Women in their 20s and 30s should have a clinical breast exam (CBE) by a health care provider as part of a regular health exam every 1 to 3 years. After age 40, women should have a CBE every year. Starting at age 40, women should consider having a mammogram (breast X-ray test) every year. Women who have a family history of breast cancer should talk to their health care provider about genetic screening. Women at a high risk of breast cancer should talk to their health care providers about having an MRI and a mammogram every year.  Breast cancer gene (BRCA)-related cancer risk assessment is recommended for women who have family members with BRCA-related cancers. BRCA-related cancers include breast, ovarian, tubal, and peritoneal cancers. Having family members with these cancers may be associated  with an increased risk for harmful changes (mutations) in the breast cancer genes BRCA1 and BRCA2. Results of the assessment will determine the need for genetic counseling and BRCA1 and BRCA2 testing.  Routine pelvic exams to screen for cancer are   no longer recommended for nonpregnant women who are considered low risk for cancer of the pelvic organs (ovaries, uterus, and vagina) and who do not have symptoms. Ask your health care provider if a screening pelvic exam is right for you.  If you have had past treatment for cervical cancer or a condition that could lead to cancer, you need Pap tests and screening for cancer for at least 20 years after your treatment. If Pap tests have been discontinued, your risk factors (such as having a new sexual partner) need to be reassessed to determine if screening should be resumed. Some women have medical problems that increase the chance of getting cervical cancer. In these cases, your health care provider may recommend more frequent screening and Pap tests.    Colorectal cancer can be detected and often prevented. Most routine colorectal cancer screening begins at the age of 50 years and continues through age 75 years. However, your health care provider may recommend screening at an earlier age if you have risk factors for colon cancer. On a yearly basis, your health care provider may provide home test kits to check for hidden blood in the stool. Use of a small camera at the end of a tube, to directly examine the colon (sigmoidoscopy or colonoscopy), can detect the earliest forms of colorectal cancer. Talk to your health care provider about this at age 50, when routine screening begins.  Direct exam of the colon should be repeated every 5-10 years through age 75 years, unless early forms of pre-cancerous polyps or small growths are found.  Osteoporosis is a disease in which the bones lose minerals and strength with aging. This can result in serious bone fractures or  breaks. The risk of osteoporosis can be identified using a bone density scan. Women ages 65 years and over and women at risk for fractures or osteoporosis should discuss screening with their health care providers. Ask your health care provider whether you should take a calcium supplement or vitamin D to reduce the rate of osteoporosis.  Menopause can be associated with physical symptoms and risks. Hormone replacement therapy is available to decrease symptoms and risks. You should talk to your health care provider about whether hormone replacement therapy is right for you.  Use sunscreen. Apply sunscreen liberally and repeatedly throughout the day. You should seek shade when your shadow is shorter than you. Protect yourself by wearing long sleeves, pants, a wide-brimmed hat, and sunglasses year round, whenever you are outdoors.  Once a month, do a whole body skin exam, using a mirror to look at the skin on your back. Tell your health care provider of new moles, moles that have irregular borders, moles that are larger than a pencil eraser, or moles that have changed in shape or color.  Stay current with required vaccines (immunizations).  Influenza vaccine. All adults should be immunized every year.  Tetanus, diphtheria, and acellular pertussis (Td, Tdap) vaccine. Pregnant women should receive 1 dose of Tdap vaccine during each pregnancy. The dose should be obtained regardless of the length of time since the last dose. Immunization is preferred during the 27th-36th week of gestation. An adult who has not previously received Tdap or who does not know her vaccine status should receive 1 dose of Tdap. This initial dose should be followed by tetanus and diphtheria toxoids (Td) booster doses every 10 years. Adults with an unknown or incomplete history of completing a 3-dose immunization series with Td-containing vaccines should begin or complete a primary   should begin or complete a primary immunization series including a Tdap dose. Adults should receive a Td booster every 10  years.    Zoster vaccine. One dose is recommended for adults aged 70 years or older unless certain conditions are present.    Pneumococcal 13-valent conjugate (PCV13) vaccine. When indicated, a person who is uncertain of her immunization history and has no record of immunization should receive the PCV13 vaccine. An adult aged 36 years or older who has certain medical conditions and has not been previously immunized should receive 1 dose of PCV13 vaccine. This PCV13 should be followed with a dose of pneumococcal polysaccharide (PPSV23) vaccine. The PPSV23 vaccine dose should be obtained at least 1 or more year(s) after the dose of PCV13 vaccine. An adult aged 2 years or older who has certain medical conditions and previously received 1 or more doses of PPSV23 vaccine should receive 1 dose of PCV13. The PCV13 vaccine dose should be obtained 1 or more years after the last PPSV23 vaccine dose.    Pneumococcal polysaccharide (PPSV23) vaccine. When PCV13 is also indicated, PCV13 should be obtained first. All adults aged 17 years and older should be immunized. An adult younger than age 56 years who has certain medical conditions should be immunized. Any person who resides in a nursing home or long-term care facility should be immunized. An adult smoker should be immunized. People with an immunocompromised condition and certain other conditions should receive both PCV13 and PPSV23 vaccines. People with human immunodeficiency virus (HIV) infection should be immunized as soon as possible after diagnosis. Immunization during chemotherapy or radiation therapy should be avoided. Routine use of PPSV23 vaccine is not recommended for American Indians, [place] Natives, or people younger than 65 years unless there are medical conditions that require PPSV23 vaccine. When indicated, people who have unknown immunization and have no record of immunization should receive PPSV23 vaccine. One-time revaccination 5 years after the  first dose of PPSV23 is recommended for people aged 19-64 years who have chronic kidney failure, nephrotic syndrome, asplenia, or immunocompromised conditions. People who received 1-2 doses of PPSV23 before age 60 years should receive another dose of PPSV23 vaccine at age 47 years or later if at least 5 years have passed since the previous dose. Doses of PPSV23 are not needed for people immunized with PPSV23 at or after age 31 years.   Preventive Services / Frequency  Ages 2 years and over  Blood pressure check.  Lipid and cholesterol check.  Lung cancer screening. / Every year if you are aged 32-80 years and have a 30-pack-year history of smoking and currently smoke or have quit within the past 15 years. Yearly screening is stopped once you have quit smoking for at least 15 years or develop a health problem that would prevent you from having lung cancer treatment.  Clinical breast exam.** / Every year after age 41 years.   BRCA-related cancer risk assessment.** / For women who have family members with a BRCA-related cancer (breast, ovarian, tubal, or peritoneal cancers).  Mammogram.** / Every year beginning at age 20 years and continuing for as long as you are in good health. Consult with your health care provider.  Pap test.** / Every 3 years starting at age 106 years through age 17 or 24 years with 3 consecutive normal Pap tests. Testing can be stopped between 65 and 70 years with 3 consecutive normal Pap tests and no abnormal Pap or HPV tests in the past 10 years.  Fecal occult  Every year beginning at age 50 years and continuing until age 75 years. You may not need to do this test if you get a colonoscopy every 10 years.  Flexible sigmoidoscopy or colonoscopy.** / Every 5 years for a flexible sigmoidoscopy or every 10 years for a colonoscopy beginning at age 50 years and continuing until age 75 years.  Hepatitis C blood  test.** / For all people born from 1945 through 1965 and any individual with known risks for hepatitis C.  Osteoporosis screening.** / A one-time screening for women ages 65 years and over and women at risk for fractures or osteoporosis.  Skin self-exam. / Monthly.  Influenza vaccine. / Every year.  Tetanus, diphtheria, and acellular pertussis (Tdap/Td) vaccine.** / 1 dose of Td every 10 years.  Zoster vaccine.** / 1 dose for adults aged 60 years or older.  Pneumococcal 13-valent conjugate (PCV13) vaccine.** / Consult your health care provider.  Pneumococcal polysaccharide (PPSV23) vaccine.** / 1 dose for all adults aged 65 years and older. Screening for abdominal aortic aneurysm (AAA)  by ultrasound is recommended for people who have history of high blood pressure or who are current or former smokers. ++++++++++++++++++++ Recommend Adult Low Dose Aspirin or  coated  Aspirin 81 mg daily  To reduce risk of Colon Cancer 40 %,  Skin Cancer 26 % ,  Melanoma 46%  and  Pancreatic cancer 60% ++++++++++++++++++++ Vitamin D goal  is between 70-100.  Please make sure that you are taking your Vitamin D as directed.  It is very important as a natural anti-inflammatory  helping hair, skin, and nails, as well as reducing stroke and heart attack risk.  It helps your bones and helps with mood. It also decreases numerous cancer risks so please take it as directed.  Low Vit D is associated with a 200-300% higher risk for CANCER  and 200-300% higher risk for HEART   ATTACK  &  STROKE.   ...................................... It is also associated with higher death rate at younger ages,  autoimmune diseases like Rheumatoid arthritis, Lupus, Multiple Sclerosis.    Also many other serious conditions, like depression, Alzheimer's Dementia, infertility, muscle aches, fatigue, fibromyalgia - just to name a few. ++++++++++++++++++ Recommend the book "The END of DIETING" by Dr Joel Fuhrman  & the book  "The END of DIABETES " by Dr Joel Fuhrman At [employer] - get book & Audio CD's    Being diabetic has a  300% increased risk for heart attack, stroke, cancer, and alzheimer- type vascular dementia. It is very important that you work harder with diet by avoiding all foods that are white. Avoid white rice (brown & wild rice is OK), white potatoes (sweetpotatoes in moderation is OK), White bread or wheat bread or anything made out of white flour like bagels, donuts, rolls, buns, biscuits, cakes, pastries, cookies, pizza crust, and pasta (made from white flour & egg whites) - vegetarian pasta or spinach or wheat pasta is OK. Multigrain breads like Arnold's or Pepperidge [employer], or multigrain sandwich thins or flatbreads.  Diet, exercise and weight loss can reverse and cure diabetes in the early stages.  Diet, exercise and weight loss is very important in the control and prevention of complications of diabetes which affects every system in your body, ie. Brain - dementia/stroke, eyes - glaucoma/blindness, heart - heart attack/heart failure, kidneys - dialysis, stomach - gastric paralysis, intestines - malabsorption, nerves - severe painful neuritis, circulation - gangrene & loss of a leg(s), and finally cancer and   a leg(s), and finally cancer and Alzheimers.    I recommend avoid fried & greasy foods,  sweets/candy, white rice (brown or wild rice or Quinoa is OK), white potatoes (sweet potatoes are OK) - anything made from white flour - bagels, doughnuts, rolls, buns, biscuits,white and wheat breads, pizza crust and traditional pasta made of white flour & egg white(vegetarian pasta or spinach or wheat pasta is OK).  Multi-grain bread is OK - like multi-grain flat bread or sandwich thins. Avoid alcohol in excess. Exercise is also important.    Eat all the vegetables you want - avoid meat, especially red meat and dairy - especially cheese.  Cheese is the most concentrated form of trans-fats which is the worst thing to clog up our arteries. Veggie cheese is OK which can be found in the  fresh produce section at [employer] or [employer] or [employer]  +++++++++++++++++++ DASH Eating Plan  DASH stands for "Dietary Approaches to Stop Hypertension."   The DASH eating plan is a healthy eating plan that has been shown to reduce high blood pressure (hypertension). Additional health benefits may include reducing the risk of type 2 diabetes mellitus, heart disease, and stroke. The DASH eating plan may also help with weight loss. WHAT DO I NEED TO KNOW ABOUT THE DASH EATING PLAN? For the DASH eating plan, you will follow these general guidelines:  Choose foods with a percent daily value for sodium of less than 5% (as listed on the food label).  Use salt-free seasonings or herbs instead of table salt or sea salt.  Check with your health care provider or pharmacist before using salt substitutes.  Eat lower-sodium products, often labeled as "lower sodium" or "no salt added."  Eat fresh foods.  Eat more vegetables, fruits, and low-fat dairy products.  Choose whole grains. Look for the word "whole" as the first word in the ingredient list.  Choose fish   Limit sweets, desserts, sugars, and sugary drinks.  Choose heart-healthy fats.  Eat veggie cheese   Eat more home-cooked food and less restaurant, buffet, and fast food.  Limit fried foods.  Cook foods using methods other than frying.  Limit canned vegetables. If you do use them, rinse them well to decrease the sodium.  When eating at a restaurant, ask that your food be prepared with less salt, or no salt if possible.                      WHAT FOODS CAN I EAT? Read Dr Fara Olden Fuhrman's books on The End of Dieting & The End of Diabetes  Grains Whole grain or whole wheat bread. Brown rice. Whole grain or whole wheat pasta. Quinoa, bulgur, and whole grain cereals. Low-sodium cereals. Corn or whole wheat flour tortillas. Whole grain cornbread. Whole grain crackers. Low-sodium crackers.  Vegetables Fresh or frozen  vegetables (raw, steamed, roasted, or grilled). Low-sodium or reduced-sodium tomato and vegetable juices. Low-sodium or reduced-sodium tomato sauce and paste. Low-sodium or reduced-sodium canned vegetables.   Fruits All fresh, canned (in natural juice), or frozen fruits.  Protein Products  All fish and seafood.  Dried beans, peas, or lentils. Unsalted nuts and seeds. Unsalted canned beans.  Dairy Low-fat dairy products, such as skim or 1% milk, 2% or reduced-fat cheeses, low-fat ricotta or cottage cheese, or plain low-fat yogurt. Low-sodium or reduced-sodium cheeses.  Fats and Oils Tub margarines without trans fats. Light or reduced-fat mayonnaise and salad dressings (reduced sodium). Avocado. Safflower, olive, or canola oils.  Natural peanut or almond butter.  Other Unsalted popcorn and pretzels. The items listed above may not be a complete list of recommended foods or beverages. Contact your dietitian for more options.  +++++++++++++++  WHAT FOODS ARE NOT RECOMMENDED? Grains/ White flour or wheat flour White bread. White pasta. White rice. Refined cornbread. Bagels and croissants. Crackers that contain trans fat.  Vegetables  Creamed or fried vegetables. Vegetables in a . Regular canned vegetables. Regular canned tomato sauce and paste. Regular tomato and vegetable juices.  Fruits Dried fruits. Canned fruit in light or heavy syrup. Fruit juice.  Meat and Other Protein Products Meat in general - RED meat & White meat.  Fatty cuts of meat. Ribs, chicken wings, all processed meats as bacon, sausage, bologna, salami, fatback, hot dogs, bratwurst and packaged luncheon meats.  Dairy Whole or 2% milk, cream, half-and-half, and cream cheese. Whole-fat or sweetened yogurt. Full-fat cheeses or blue cheese. Non-dairy creamers and whipped toppings. Processed cheese, cheese spreads, or cheese curds.  Condiments Onion and garlic salt, seasoned salt, table salt, and sea salt. Canned and  packaged gravies. Worcestershire sauce. Tartar sauce. Barbecue sauce. Teriyaki sauce. Soy sauce, including reduced sodium. Steak sauce. Fish sauce. Oyster sauce. Cocktail sauce. Horseradish. Ketchup and mustard. Meat flavorings and tenderizers. Bouillon cubes. Hot sauce. Tabasco sauce. Marinades. Taco seasonings. Relishes.  Fats and Oils Butter, stick margarine, lard, shortening and bacon fat. Coconut, palm kernel, or palm oils. Regular salad dressings.  Pickles and olives. Salted popcorn and pretzels.  The items listed above may not be a complete list of foods and beverages to avoid.

## 2020-03-18 NOTE — Progress Notes (Addendum)
Annual Screening/Preventative Visit & Comprehensive Evaluation &  Examination      This very nice 86 y.o.  [name] presents for a Screening /Preventative Visit & comprehensive evaluation and management of multiple medical co-morbidities.  Patient has been followed for HTN, HLD, Prediabetes  and Vitamin D Deficiency.       HTN predates circa 1997. Patient's BP has been controlled at home and patient denies any cardiac symptoms as chest pain, palpitations, shortness of breath, dizziness or ankle swelling. Patient has CKD3a  (GFR 49) which is attributed to her HTN.  Today's BP is at goal - 106/62.       Patient's hyperlipidemia is controlled with diet and  Rosuvastatin. Patient denies myalgias or other medication SE's. Last lipids were at goal:  Lab Results  Component Value Date   CHOL 180 12/07/2019   HDL 63 12/07/2019   LDLCALC 96 12/07/2019   TRIG 112 12/07/2019   CHOLHDL 2.9 12/07/2019       Patient has hx/o prediabetes (A1c 6.1% /2010) and patient denies reactive hypoglycemic symptoms, visual blurring, diabetic polys or paresthesias. Last A1c was not at goal:      Finally, patient has history of Vitamin D ("25" /2008) and last Vitamin D was at goal:  Lab Results  Component Value Date   VD25OH 62 09/06/2019    Current Outpatient Medications on File Prior to Visit  Medication Sig  . amLODipine  5 MG  Take 1 tablet 2 x /day ofr  BP  . aspirin 81 MG Take  daily.  . Biotin 2500 MCG  Take  daily.  Ann Kitchen VITAMIN D  2000 u  Taking 6000 units daily  . CINNAMON PO 2,000 mg  Take daily.  Ann Kitchen VITAMIN B-12 1 tablet daily.  . fish oil-omega-3 1000 MG  Take 2 g  daily  . hydrochlorothiazide25 MG  Take 1 tablet Daily for Fluid Retention  . olmesartan  20 MG  TAKE ONE TABLET DAILY   . vitamin for her eyes  Takes 1 daily  . rosuvastatin  5 MG tablet Take       1 tablet      Daily       for Cholesterol    Allergies  Allergen Reactions  . Ace Inhibitors     Hyperkalemia     Past Medical  History:  Diagnosis Date  . Anemia of chronic renal failure   . Chronic renal insufficiency   . Degenerative joint disease   . HTN (hypertension)   . Hyperlipidemia   . Shingles   . Vitamin D deficiency    Health Maintenance  Topic Date Due  . COVID-19 Vaccine (3 - Booster for Pfizer series) 01/01/2020  . TETANUS/TDAP  10/24/2026  . INFLUENZA VACCINE  Completed  . DEXA SCAN  Completed  . PNA vac Low Risk Adult  Completed   Immunization History  Administered Date(s) Administered  . Influenza Split 01/17/2013  . Influenza, High Dose Seasonal PF 01/02/2014, 01/16/2015, 01/02/2016, 01/19/2018, 01/05/2019, 01/10/2020  . PFIZER SARS-COV-2 Vaccination 06/11/2019, 07/02/2019  . Pneumococcal Conjugate-13 06/08/2015  . Pneumococcal Polysaccharide-23 06/04/2010  . Pneumococcal-Unspecified 01/31/1999  . Td 04/01/1995, 12/05/2005, 10/23/2016    Last Colon - 2006 - Dr Juanda Chance - f/u deferred to age.   Last MGM - 04/19/2012  No past surgical history on file.   Family History  Problem Relation Age of Onset  . CVA Father   . Cancer Daughter        breast  Social History   Tobacco Use  . Smoking status: Never Smoker  . Smokeless tobacco: Never Used  Substance Use Topics  . Alcohol use: Yes    Alcohol/week: 1.0 standard drink    Types: 1 Glasses of wine per week    Comment: occasional wine  . Drug use: No    ROS Constitutional: Denies fever, chills, weight loss/gain, headaches, insomnia,  night sweats, and change in appetite. Does c/o fatigue. Eyes: Denies redness, blurred vision, diplopia, discharge, itchy, watery eyes.  ENT: Denies discharge, congestion, post nasal drip, epistaxis, sore throat, earache, hearing loss, dental pain, Tinnitus, Vertigo, Sinus pain, snoring.  Cardio: Denies chest pain, palpitations, irregular heartbeat, syncope, dyspnea, diaphoresis, orthopnea, PND, claudication, edema Respiratory: denies cough, dyspnea, DOE, pleurisy, hoarseness, laryngitis,  wheezing.  Gastrointestinal: Denies dysphagia, heartburn, reflux, water brash, pain, cramps, nausea, vomiting, bloating, diarrhea, constipation, hematemesis, melena, hematochezia, jaundice, hemorrhoids Genitourinary: Denies dysuria, frequency, urgency, nocturia, hesitancy, discharge, hematuria, flank pain Breast: Breast lumps, nipple discharge, bleeding.  Musculoskeletal: Denies arthralgia, myalgia, stiffness, Jt. Swelling, pain, limp, and strain/sprain. Denies falls. Skin: Denies puritis, rash, hives, warts, acne, eczema, changing in skin lesion Neuro: No weakness, tremor, incoordination, spasms, paresthesia, pain Psychiatric: Denies confusion, memory loss, sensory loss. Denies Depression. Endocrine: Denies change in weight, skin, hair change, nocturia, and paresthesia, diabetic polys, visual blurring, hyper / hypo glycemic episodes.  Heme/Lymph: No excessive bleeding, bruising, enlarged lymph nodes.  Physical Exam  BP 106/62   Pulse 86   Temp (!) 97 F (36.1 C)   Resp 16   Ht 5' 0.5" (1.537 m)   Wt 118 lb 3.2 oz (53.6 kg)   SpO2 99%   BMI 22.70 kg/m   General Appearance: Well nourished, well groomed and in no apparent distress.  Eyes: PERRLA, EOMs, conjunctiva no swelling or erythema, normal fundi and vessels. Sinuses: No frontal/maxillary tenderness ENT/Mouth: EACs patent / TMs  nl. Nares clear without erythema, swelling, mucoid exudates. Oral hygiene is good. No erythema, swelling, or exudate. Tongue normal, non-obstructing. Tonsils not swollen or erythematous. Hearing normal.  Neck: Supple, thyroid not palpable. No bruits, nodes or JVD. Respiratory: Respiratory effort normal.  BS equal and clear bilateral without rales, rhonci, wheezing or stridor. Cardio: Heart sounds are normal with regular rate and rhythm and no murmurs, rubs or gallops. Peripheral pulses are normal and equal bilaterally without edema. No aortic or femoral bruits. Chest: symmetric with normal excursions and  percussion. Breasts: Symmetric, without lumps, nipple discharge, retractions, or fibrocystic changes.  Abdomen: Flat, soft with bowel sounds active. Nontender, no guarding, rebound, hernias, masses, or organomegaly.  Lymphatics: Non tender without lymphadenopathy.  Genitourinary:  Musculoskeletal: Full ROM all peripheral extremities, joint stability, 5/5 strength, and normal gait. Skin: Warm and dry without rashes, lesions, cyanosis, clubbing or  ecchymosis.  Neuro: Cranial nerves intact, reflexes equal bilaterally. Normal muscle tone, no cerebellar symptoms. Sensation intact.  Pysch: Alert and oriented X 3, normal affect, Insight and Judgment appropriate.   Assessment and Plan  1. Annual Preventative Screening Examination   2. Essential hypertension  - EKG 12-Lead - Urinalysis, Routine w reflex microscopic - Microalbumin / creatinine urine ratio - PTH, intact and calcium - CBC with Differential/Platelet - COMPLETE METABOLIC PANEL WITH GFR - Magnesium - TSH  3. Hyperlipidemia, mixed  - EKG 12-Lead - Lipid panel  4. Abnormal glucose  - EKG 12-Lead - PSA - Hemoglobin A1c - Insulin, random  5. Vitamin D deficiency  - VITAMIN D 25 Hydroxy   6. Stage 3a  chronic kidney disease (HCC)  - EKG 12-Lead - PTH, intact and calcium - CBC with Differential/Platelet - COMPLETE METABOLIC PANEL WITH GFR  7. Vitamin B12 deficiency   8. Screening for colorectal cancer  - POC Hemoccult Bld/Stl   9. Screening for ischemic heart disease  - EKG 12-Lead  10. Family history of cerebrovascular event  - EKG 12-Lead - Vitamin B12  11. Medication management  - Urinalysis, Routine w reflex microscopic - Microalbumin / creatinine urine ratio - CBC with Differential/Platelet - COMPLETE METABOLIC PANEL WITH GFR - Magnesium - Lipid panel - TSH - Hemoglobin A1c - Insulin, random - VITAMIN D 25 Hydroxy        Patient was counseled in prudent diet to achieve/maintain BMI less  than 25 for weight control, BP monitoring, regular exercise and medications. Discussed med's effects and SE's. Screening labs and tests as requested with regular follow-up as recommended. Over 40 minutes of exam, counseling, chart review and high complex critical decision making was performed.   Ann Maw, MD

## 2020-03-19 ENCOUNTER — Other Ambulatory Visit: Payer: Self-pay

## 2020-03-19 ENCOUNTER — Ambulatory Visit (INDEPENDENT_AMBULATORY_CARE_PROVIDER_SITE_OTHER): Payer: PPO | Admitting: Internal Medicine

## 2020-03-19 VITALS — BP 106/62 | HR 86 | Temp 97.0°F | Resp 16 | Ht 60.5 in | Wt 118.2 lb

## 2020-03-19 DIAGNOSIS — E782 Mixed hyperlipidemia: Secondary | ICD-10-CM | POA: Diagnosis not present

## 2020-03-19 DIAGNOSIS — Z0001 Encounter for general adult medical examination with abnormal findings: Secondary | ICD-10-CM

## 2020-03-19 DIAGNOSIS — Z1212 Encounter for screening for malignant neoplasm of rectum: Secondary | ICD-10-CM

## 2020-03-19 DIAGNOSIS — R7309 Other abnormal glucose: Secondary | ICD-10-CM | POA: Diagnosis not present

## 2020-03-19 DIAGNOSIS — N1831 Chronic kidney disease, stage 3a: Secondary | ICD-10-CM | POA: Diagnosis not present

## 2020-03-19 DIAGNOSIS — Z0000 Encounter for general adult medical examination without abnormal findings: Secondary | ICD-10-CM

## 2020-03-19 DIAGNOSIS — Z79899 Other long term (current) drug therapy: Secondary | ICD-10-CM

## 2020-03-19 DIAGNOSIS — E559 Vitamin D deficiency, unspecified: Secondary | ICD-10-CM | POA: Diagnosis not present

## 2020-03-19 DIAGNOSIS — E538 Deficiency of other specified B group vitamins: Secondary | ICD-10-CM

## 2020-03-19 DIAGNOSIS — R7303 Prediabetes: Secondary | ICD-10-CM

## 2020-03-19 DIAGNOSIS — I10 Essential (primary) hypertension: Secondary | ICD-10-CM | POA: Diagnosis not present

## 2020-03-19 DIAGNOSIS — Z8249 Family history of ischemic heart disease and other diseases of the circulatory system: Secondary | ICD-10-CM | POA: Diagnosis not present

## 2020-03-19 DIAGNOSIS — Z136 Encounter for screening for cardiovascular disorders: Secondary | ICD-10-CM

## 2020-03-19 DIAGNOSIS — Z1211 Encounter for screening for malignant neoplasm of colon: Secondary | ICD-10-CM

## 2020-03-20 ENCOUNTER — Other Ambulatory Visit: Payer: Self-pay | Admitting: Internal Medicine

## 2020-03-20 LAB — COMPLETE METABOLIC PANEL WITH GFR
AG Ratio: 1.7 (calc) (ref 1.0–2.5)
ALT: 7 U/L (ref 6–29)
AST: 15 U/L (ref 10–35)
Albumin: 4.6 g/dL (ref 3.6–5.1)
Alkaline phosphatase (APISO): 73 U/L (ref 37–153)
BUN/Creatinine Ratio: 18 (calc) (ref 6–22)
BUN: 21 mg/dL (ref 7–25)
CO2: 27 mmol/L (ref 20–32)
Calcium: 10.4 mg/dL (ref 8.6–10.4)
Chloride: 101 mmol/L (ref 98–110)
Creat: 1.14 mg/dL — ABNORMAL HIGH (ref 0.60–0.88)
GFR, Est African American: 50 mL/min/{1.73_m2} — ABNORMAL LOW (ref >=60)
GFR, Est Non African American: 43 mL/min/{1.73_m2} — ABNORMAL LOW (ref >=60)
Globulin: 2.7 g/dL (calc) (ref 1.9–3.7)
Glucose, Bld: 102 mg/dL — ABNORMAL HIGH (ref 65–99)
Potassium: 5.1 mmol/L (ref 3.5–5.3)
Sodium: 139 mmol/L (ref 135–146)
Total Bilirubin: 0.4 mg/dL (ref 0.2–1.2)
Total Protein: 7.3 g/dL (ref 6.1–8.1)

## 2020-03-20 LAB — CBC WITH DIFFERENTIAL/PLATELET
Absolute Monocytes: 557 cells/uL (ref 200–950)
Basophils Absolute: 42 cells/uL (ref 0–200)
Basophils Relative: 0.8 %
Eosinophils Absolute: 69 cells/uL (ref 15–500)
Eosinophils Relative: 1.3 %
HCT: 33.5 % — ABNORMAL LOW (ref 35.0–45.0)
Hemoglobin: 11 g/dL — ABNORMAL LOW (ref 11.7–15.5)
Lymphs Abs: 1452 cells/uL (ref 850–3900)
MCH: 27.8 pg (ref 27.0–33.0)
MCHC: 32.8 g/dL (ref 32.0–36.0)
MCV: 84.6 fL (ref 80.0–100.0)
MPV: 12.5 fL (ref 7.5–12.5)
Monocytes Relative: 10.5 %
Neutro Abs: 3180 cells/uL (ref 1500–7800)
Neutrophils Relative %: 60 %
Platelets: 198 10*3/uL (ref 140–400)
RBC: 3.96 10*6/uL (ref 3.80–5.10)
RDW: 13.6 % (ref 11.0–15.0)
Total Lymphocyte: 27.4 %
WBC: 5.3 10*3/uL (ref 3.8–10.8)

## 2020-03-20 LAB — LIPID PANEL
Cholesterol: 194 mg/dL (ref <200)
HDL: 67 mg/dL (ref >=50)
LDL Cholesterol (Calc): 104 mg/dL (calc) — ABNORMAL HIGH
Non-HDL Cholesterol (Calc): 127 mg/dL (calc) (ref <130)
Total CHOL/HDL Ratio: 2.9 (calc) (ref <5.0)
Triglycerides: 130 mg/dL (ref <150)

## 2020-03-20 LAB — URINALYSIS, ROUTINE W REFLEX MICROSCOPIC
Bacteria, UA: NONE SEEN /HPF
Bilirubin Urine: NEGATIVE
Glucose, UA: NEGATIVE
Hgb urine dipstick: NEGATIVE
Hyaline Cast: NONE SEEN /LPF
Ketones, ur: NEGATIVE
Nitrite: NEGATIVE
Protein, ur: NEGATIVE
RBC / HPF: NONE SEEN /HPF (ref 0–2)
Specific Gravity, Urine: 1.015 (ref 1.001–1.03)
Squamous Epithelial / HPF: NONE SEEN /HPF (ref <=5)
WBC, UA: NONE SEEN /HPF (ref 0–5)
pH: 6 (ref 5.0–8.0)

## 2020-03-20 LAB — HEMOGLOBIN A1C
Hgb A1c MFr Bld: 6.1 % of total Hgb — ABNORMAL HIGH (ref <5.7)
Mean Plasma Glucose: 128 mg/dL
eAG (mmol/L): 7.1 mmol/L

## 2020-03-20 LAB — VITAMIN D 25 HYDROXY (VIT D DEFICIENCY, FRACTURES): Vit D, 25-Hydroxy: 65 ng/mL (ref 30–100)

## 2020-03-20 LAB — MICROALBUMIN / CREATININE URINE RATIO
Creatinine, Urine: 131 mg/dL (ref 20–275)
Microalb Creat Ratio: 8 mcg/mg creat (ref <30)
Microalb, Ur: 1 mg/dL

## 2020-03-20 LAB — VITAMIN B12: Vitamin B-12: >2000 pg/mL — ABNORMAL HIGH (ref 200–1100)

## 2020-03-20 LAB — PTH, INTACT AND CALCIUM
Calcium: 10.4 mg/dL (ref 8.6–10.4)
PTH: 36 pg/mL (ref 14–64)

## 2020-03-20 LAB — INSULIN, RANDOM: Insulin: 7.7 u[IU]/mL

## 2020-03-20 LAB — PSA: PSA: <0.04 ng/mL

## 2020-03-20 LAB — MAGNESIUM: Magnesium: 2.1 mg/dL (ref 1.5–2.5)

## 2020-03-20 LAB — TSH: TSH: 1.94 mIU/L (ref 0.40–4.50)

## 2020-03-20 NOTE — Progress Notes (Signed)
========================================================== ==========================================================  -    Vitamin B12 level - very high - recc decrease your Vit B12 tab to 2 x /week  ==========================================================  -  PTH - is hormone regulating calcium balance & is Normal  ==========================================================  -  Kidney functions are stable ==========================================================  -  Chol = 194 -  Excellent   - Very low risk for Heart Attack  / Stroke ========================================================  - A1c - has gone up from 5.8% to now 6.1%  - glucoses too high   - Avoid Sweets, Candy & White Stuff   - Rice, Potatoes, Breads &  Pasta ==========================================================  -  Vitamin D = 65 - excellent  ==========================================================  -  All Else - CBC - Kidneys - Electrolytes - Liver - Magnesium & Thyroid    - all  Normal / OK =======================================================-==

## 2020-04-05 DIAGNOSIS — H353132 Nonexudative age-related macular degeneration, bilateral, intermediate dry stage: Secondary | ICD-10-CM | POA: Diagnosis not present

## 2020-05-05 DIAGNOSIS — H353132 Nonexudative age-related macular degeneration, bilateral, intermediate dry stage: Secondary | ICD-10-CM | POA: Diagnosis not present

## 2020-05-09 ENCOUNTER — Other Ambulatory Visit: Payer: Self-pay

## 2020-05-09 MED ORDER — ROSUVASTATIN CALCIUM 5 MG PO TABS: ORAL_TABLET | ORAL | 0 refills | Status: DC

## 2020-05-10 DIAGNOSIS — H353132 Nonexudative age-related macular degeneration, bilateral, intermediate dry stage: Secondary | ICD-10-CM | POA: Diagnosis not present

## 2020-05-10 DIAGNOSIS — H524 Presbyopia: Secondary | ICD-10-CM | POA: Diagnosis not present

## 2020-05-10 DIAGNOSIS — H3561 Retinal hemorrhage, right eye: Secondary | ICD-10-CM | POA: Diagnosis not present

## 2020-05-10 DIAGNOSIS — H26493 Other secondary cataract, bilateral: Secondary | ICD-10-CM | POA: Diagnosis not present

## 2020-05-10 DIAGNOSIS — H35033 Hypertensive retinopathy, bilateral: Secondary | ICD-10-CM | POA: Diagnosis not present

## 2020-05-24 ENCOUNTER — Ambulatory Visit: Payer: PPO | Admitting: Adult Health

## 2020-05-27 ENCOUNTER — Other Ambulatory Visit: Payer: Self-pay | Admitting: Internal Medicine

## 2020-05-27 DIAGNOSIS — I10 Essential (primary) hypertension: Secondary | ICD-10-CM

## 2020-05-27 MED ORDER — OLMESARTAN MEDOXOMIL 20 MG PO TABS
ORAL_TABLET | ORAL | 0 refills | Status: DC
Start: 2020-05-27 — End: 2021-05-29

## 2020-06-04 DIAGNOSIS — H353132 Nonexudative age-related macular degeneration, bilateral, intermediate dry stage: Secondary | ICD-10-CM | POA: Diagnosis not present

## 2020-06-27 ENCOUNTER — Ambulatory Visit (INDEPENDENT_AMBULATORY_CARE_PROVIDER_SITE_OTHER): Payer: PPO | Admitting: Adult Health Nurse Practitioner

## 2020-06-27 ENCOUNTER — Other Ambulatory Visit: Payer: Self-pay

## 2020-06-27 ENCOUNTER — Encounter: Payer: Self-pay | Admitting: Adult Health Nurse Practitioner

## 2020-06-27 VITALS — BP 120/80 | HR 65 | Temp 97.3°F | Ht 60.5 in | Wt 122.0 lb

## 2020-06-27 DIAGNOSIS — R7303 Prediabetes: Secondary | ICD-10-CM | POA: Diagnosis not present

## 2020-06-27 DIAGNOSIS — Z0001 Encounter for general adult medical examination with abnormal findings: Secondary | ICD-10-CM

## 2020-06-27 DIAGNOSIS — D631 Anemia in chronic kidney disease: Secondary | ICD-10-CM

## 2020-06-27 DIAGNOSIS — E782 Mixed hyperlipidemia: Secondary | ICD-10-CM

## 2020-06-27 DIAGNOSIS — M159 Polyosteoarthritis, unspecified: Secondary | ICD-10-CM

## 2020-06-27 DIAGNOSIS — Z79899 Other long term (current) drug therapy: Secondary | ICD-10-CM

## 2020-06-27 DIAGNOSIS — E559 Vitamin D deficiency, unspecified: Secondary | ICD-10-CM

## 2020-06-27 DIAGNOSIS — N3942 Incontinence without sensory awareness: Secondary | ICD-10-CM

## 2020-06-27 DIAGNOSIS — R6889 Other general symptoms and signs: Secondary | ICD-10-CM

## 2020-06-27 DIAGNOSIS — M150 Primary generalized (osteo)arthritis: Secondary | ICD-10-CM

## 2020-06-27 DIAGNOSIS — N1831 Chronic kidney disease, stage 3a: Secondary | ICD-10-CM

## 2020-06-27 DIAGNOSIS — R300 Dysuria: Secondary | ICD-10-CM

## 2020-06-27 DIAGNOSIS — M8949 Other hypertrophic osteoarthropathy, multiple sites: Secondary | ICD-10-CM

## 2020-06-27 DIAGNOSIS — M810 Age-related osteoporosis without current pathological fracture: Secondary | ICD-10-CM

## 2020-06-27 LAB — COMPLETE METABOLIC PANEL WITH GFR
AG Ratio: 1.8 (calc) (ref 1.0–2.5)
ALT: 9 U/L (ref 6–29)
AST: 18 U/L (ref 10–35)
Albumin: 4.4 g/dL (ref 3.6–5.1)
Alkaline phosphatase (APISO): 75 U/L (ref 37–153)
BUN/Creatinine Ratio: 21 (calc) (ref 6–22)
BUN: 20 mg/dL (ref 7–25)
CO2: 27 mmol/L (ref 20–32)
Calcium: 9.9 mg/dL (ref 8.6–10.4)
Chloride: 105 mmol/L (ref 98–110)
Creat: 0.95 mg/dL — ABNORMAL HIGH (ref 0.60–0.88)
GFR, Est African American: 62 mL/min/{1.73_m2} (ref >=60)
GFR, Est Non African American: 54 mL/min/{1.73_m2} — ABNORMAL LOW (ref >=60)
Globulin: 2.4 g/dL (calc) (ref 1.9–3.7)
Glucose, Bld: 98 mg/dL (ref 65–99)
Potassium: 5.6 mmol/L — ABNORMAL HIGH (ref 3.5–5.3)
Sodium: 141 mmol/L (ref 135–146)
Total Bilirubin: 0.4 mg/dL (ref 0.2–1.2)
Total Protein: 6.8 g/dL (ref 6.1–8.1)

## 2020-06-27 LAB — CBC WITH DIFFERENTIAL/PLATELET
Absolute Monocytes: 409 cells/uL (ref 200–950)
Basophils Absolute: 28 cells/uL (ref 0–200)
Basophils Relative: 0.5 %
Eosinophils Absolute: 50 cells/uL (ref 15–500)
Eosinophils Relative: 0.9 %
HCT: 34 % — ABNORMAL LOW (ref 35.0–45.0)
Hemoglobin: 10.8 g/dL — ABNORMAL LOW (ref 11.7–15.5)
Lymphs Abs: 1770 cells/uL (ref 850–3900)
MCH: 26.9 pg — ABNORMAL LOW (ref 27.0–33.0)
MCHC: 31.8 g/dL — ABNORMAL LOW (ref 32.0–36.0)
MCV: 84.8 fL (ref 80.0–100.0)
MPV: 11.4 fL (ref 7.5–12.5)
Monocytes Relative: 7.3 %
Neutro Abs: 3343 cells/uL (ref 1500–7800)
Neutrophils Relative %: 59.7 %
Platelets: 199 10*3/uL (ref 140–400)
RBC: 4.01 10*6/uL (ref 3.80–5.10)
RDW: 13.5 % (ref 11.0–15.0)
Total Lymphocyte: 31.6 %
WBC: 5.6 10*3/uL (ref 3.8–10.8)

## 2020-06-27 LAB — LIPID PANEL
Cholesterol: 188 mg/dL (ref <200)
HDL: 61 mg/dL (ref >=50)
LDL Cholesterol (Calc): 105 mg/dL (calc) — ABNORMAL HIGH
Non-HDL Cholesterol (Calc): 127 mg/dL (calc) (ref <130)
Total CHOL/HDL Ratio: 3.1 (calc) (ref <5.0)
Triglycerides: 121 mg/dL (ref <150)

## 2020-06-27 MED ORDER — DICLOFENAC SODIUM 1 % EX GEL: 4.0000 g | Freq: Four times a day (QID) | CUTANEOUS | 3 refills | Status: DC

## 2020-06-27 NOTE — Progress Notes (Signed)
MEDICARE ANNUAL WELLNESS VISIT AND FOLLOW UP  Assessment:   Encounter for Medicare annual wellness visit Yearly Discussed mammogram last in 2021, declines further.  Essential hypertension Well controlled with current medications Continue current medications: amlodapine 5mg , olmesartan 20mg  and HCTZ 25mg  PRN for edema Monitor blood pressure at home; call if consistently over 130/80 Continue DASH diet.   Reminder to go to the ER if any CP, SOB, nausea, dizziness, severe HA, changes vision/speech, left arm numbness and tingling and jaw pain.   Osteoporosis, unspecified osteoporosis type, unspecified pathological fracture presence Monitor Declines DEXA today, wouldn't do bisphosphonates Discussed high calcium diet, vitamin D, weight bearing   CKD Stage III  (GFR 30-59 ml/min) Increase fluids, avoid NSAIDS, monitor sugars, will monitor closely She prefers to avoid nephrology referral unless absolutely necessary   Anemia r/t CKD III Monitor CBC  Hyperlipidemia, unspecified hyperlipidemia type Continue medications:Rosuvastatin 5mg  daily Discussed dietary and exercise modifications Low fat diet  Prediabetes / abnormal glucose Discussed disease and risks Discussed diet/exercise, weight management  A1C q63m, defer today, check serum glucose, monitor weight trend  Vitamin D deficiency Continue supplement Defer labs today  Primary osteoarthritis involving multiple joints Tylenol, avoid NSAIDs Continuee Voltaren gel QID as needed refill sent today. Doing well with this.  Medication management Monitor   Further disposition pending results if labs check today. Discussed med's effects and SE's.   Over 30 minutes of face to face interview, exam, counseling, chart review, and critical decision making was performed.     Plan:   During the course of the visit the patient was educated and counseled about appropriate screening and preventive services including:    Pneumococcal  vaccine   Influenza vaccine  Td vaccine  Prevnar 13  Screening electrocardiogram  Screening mammography  Bone densitometry screening  Colorectal cancer screening  Diabetes screening  Glaucoma screening  Nutrition counseling   Advanced directives: given info/requested copies   Subjective:   Ann [name] is a 87 y.o. pleasant female who presents for Medicare Annual Wellness Visit and 3 month follow up for HTN, HLD, prediabetes, cholesterol, vitamin D deficiency.   Reports she is taking tylenol intermittently for her bilateral knees.  She also uses diclofenac gel as needed.  Reports she is out of this but would like a refill.  She does not have any health or medication concerns today.  BMI is Body mass index is 23.43 kg/m., she has been working on diet and exercise, rides stationary bike 20 min most days. She is very active and works in her yard and garden. Wt Readings from Last 3 Encounters:  06/27/20 122 lb (55.3 kg)  03/19/20 118 lb 3.2 oz (53.6 kg)  12/07/19 121 lb 3.2 oz (55 kg)   Her blood pressure has been controlled at home, today their BP is BP: 120/80  She currently takes telmisartan 20 mg daily, amlodipine 5 mg daily and HCTZ 25mg  PRN  doing well with this. She does workout. She denies chest pain, shortness of breath, dizziness.    She is on cholesterol medication (Rosuvastatin 5mg  daily) and denies myalgias. Her cholesterol is not at goal. The cholesterol last visit was:   Lab Results  Component Value Date   CHOL 194 03/19/2020   HDL 67 03/19/2020   LDLCALC 104 (H) 03/19/2020   TRIG 130 03/19/2020   CHOLHDL 2.9 03/19/2020    She has been working on diet and exercise for prediabetes, and denies increased appetite, nausea, paresthesia of the feet, polydipsia, polyuria and  visual disturbances. Last A1C in the office was:  Lab Results  Component Value Date   HGBA1C 6.1 (H) 03/19/2020    She has CKD IIIa monitored at this office:  Lab Results   Component Value Date   GFRNONAA 43 (L) 03/19/2020   Patient is on Vitamin D supplement.   Lab Results  Component Value Date   VD25OH 65 03/19/2020      Medication Review Current Outpatient Medications on File Prior to Visit  Medication Sig Dispense Refill  . amLODipine (NORVASC) 5 MG tablet Take 1 tablet 2 x /day ofr  BP (Patient taking differently: Takes 1 tablet in the morning) 180 tablet 3  . aspirin 81 MG tablet Take 81 mg by mouth daily.    . Cholecalciferol (VITAMIN D3) 50 MCG (2000 UT) TABS Taking 6000 units daily 30 tablet   . CINNAMON PO Take 2,000 mg by mouth daily.    . Cyanocobalamin (VITAMIN B-12 PO) 1 tablet daily.    . fish oil-omega-3 fatty acids 1000 MG capsule Take 2 g by mouth daily. 1400 mg    . hydrochlorothiazide (HYDRODIURIL) 25 MG tablet Take 1 tablet Daily for Fluid Retention 90 tablet 3  . olmesartan (BENICAR) 20 MG tablet Take  1 tablet  Daily for BP 90 tablet 0  . OVER THE COUNTER MEDICATION Takes vitamin for her eyes 1 daily    . rosuvastatin (CRESTOR) 5 MG tablet Take 1 tablet daily for Cholesterol. 90 tablet 0   No current facility-administered medications on file prior to visit.    Current Problems (verified) Patient Active Problem List   Diagnosis Date Noted  . Urinary incontinence 05/23/2019  . CKD (chronic kidney disease) stage 3, GFR 30-59 ml/min (HCC) 05/20/2019  . Vitamin B12 deficiency 12/06/2017  . Osteoporosis 10/15/2015  . Body mass index (BMI) of 20.0-20.9 in adult 03/09/2015  . Prediabetes 01/01/2014  . Medication management 06/27/2013  . Essential hypertension   . Hyperlipidemia, mixed   . Vitamin D deficiency   . Degenerative joint disease   . Anemia of chronic renal failure, stage 3 (moderate) (HCC)     Screening Tests Immunization History  Administered Date(s) Administered  . Influenza Split 01/17/2013  . Influenza, High Dose Seasonal PF 01/02/2014, 01/16/2015, 01/02/2016, 01/19/2018, 01/05/2019, 01/10/2020  .  PFIZER(Purple Top)SARS-COV-2 Vaccination 06/11/2019, 07/02/2019  . Pneumococcal Conjugate-13 06/08/2015  . Pneumococcal Polysaccharide-23 06/04/2010  . Pneumococcal-Unspecified 01/31/1999  . Td 04/01/1995, 12/05/2005, 10/23/2016    Preventative care: Last colonoscopy:  2006 declines another due to age Last mammogram: 02/2020, declines at this time, will get later this year DEXA: 09/2015  Osteoporosis - declines bisphosphonates Declines follow up today  Prior vaccinations: TD or Tdap: 2018  Influenza: 12/2018  Pneumococcal: 2012 Prevnar13: 2013 Shingles/Zostavax: Declined  Names of Other Physician/Practitioners you currently use: 1. [facility]- here for primary care 2. Dr. Elmer Picker, eye doctor, last visit last visit 2022 3. Dr. Alvester Morin, dentist, last visit 2021, goes q24m  Patient Care Team: Lucky Cowboy, MD as PCP - General (Internal Medicine)  Allergies Allergies  Allergen Reactions  . Ace Inhibitors     Hyperkalemia     SURGICAL HISTORY She  has no past surgical history on file. FAMILY HISTORY Her family history includes CVA in her father; Cancer in her daughter. SOCIAL HISTORY She  reports that she has never smoked. She has never used smokeless tobacco. She reports current alcohol use of about 1.0 standard drink of alcohol per week. She reports  that she does not use drugs.  MEDICARE WELLNESS OBJECTIVES: Physical activity: Current Exercise Habits: Home exercise routine, Time (Minutes): 30, Frequency (Times/Week): 6, Weekly Exercise (Minutes/Week): 180, Intensity: Moderate Cardiac risk factors: Cardiac Risk Factors include: advanced age (>72men, >74 women);dyslipidemia;hypertension Depression/mood screen:   Depression screen [facility] 2/9 06/27/2020  Decreased Interest 0  Down, Depressed, Hopeless 0  PHQ - 2 Score 0    ADLs:  In your present state of health, do you have any difficulty performing the following activities: 06/27/2020  03/18/2020  Hearing? N N  Vision? N N  Difficulty concentrating or making decisions? N N  Walking or climbing stairs? N N  Dressing or bathing? N N  Doing errands, shopping? N N  Preparing Food and eating ? N -  Using the Toilet? N -  In the past six months, have you accidently leaked urine? N -  Do you have problems with loss of bowel control? N -  Managing your Medications? N -  Managing your Finances? N -  Housekeeping or managing your Housekeeping? N -  Some recent data might be hidden     Cognitive Testing  Alert? Yes  Normal Appearance?Yes  Oriented to person? Yes  Place? Yes   Time? Yes  Recall of three objects?  Yes  Can perform simple calculations? Yes  Displays appropriate judgment?Yes  Can read the correct time from a watch face?Yes  EOL planning: Does Patient Have a Medical Advance Directive?: Yes Type of Advance Directive: Healthcare Power of Attorney,Living will Does patient want to make changes to medical advance directive?: No - Patient declined Copy of Healthcare Power of Attorney in Chart?: No - copy requested   Objective:   Today's Vitals   06/27/20 1036  BP: 120/80  Pulse: 65  Temp: (!) 97.3 F (36.3 C)  SpO2: 100%  Weight: 122 lb (55.3 kg)  Height: 5' 0.5" (1.537 m)   Body mass index is 23.43 kg/m.  General appearance: alert, no distress, WD/WN,  female HEENT: normocephalic, sclerae anicteric, TMs pearly, nares patent, no discharge or erythema, pharynx normal Oral cavity: MMM, no lesions Neck: supple, no lymphadenopathy, no thyromegaly, no masses Heart: RRR, normal S1, S2, no murmurs Lungs: CTA bilaterally, no wheezes, rhonchi, or rales Abdomen: +bs, soft, non tender, non distended, no masses, no hepatomegaly, no splenomegaly Musculoskeletal: nontender, no swelling, no obvious deformity. She has limited ROM to L shoulder, abduction past 90 degrees is limited (per patient ongoing for multiple years, declined surgery)  Extremities: no edema, no  cyanosis, no clubbing  Pulses: 2+ symmetric, upper and lower extremities, normal cap refill Neurological: alert, oriented x 3, CN2-12 intact, strength normal upper extremities and lower extremities, sensation normal throughout, DTRs 2+ throughout, no cerebellar signs, gait normal Psychiatric: normal affect, behavior normal, pleasant  Breast: defer Gyn: defer Rectal: defer   Medicare Attestation I have personally reviewed: The patient's medical and social history Their use of alcohol, tobacco or illicit drugs Their current medications and supplements The patient's functional ability including ADLs,fall risks, home safety risks, cognitive, and hearing and visual impairment Diet and physical activities Evidence for depression or mood disorders  The patient's weight, height, BMI, and visual acuity have been recorded in the chart.  I have made referrals, counseling, and provided education to the patient based on review of the above and I have provided the patient with a written personalized care plan for preventive services.     Elder Negus, NP   06/27/2020

## 2020-07-04 DIAGNOSIS — H353132 Nonexudative age-related macular degeneration, bilateral, intermediate dry stage: Secondary | ICD-10-CM | POA: Diagnosis not present

## 2020-08-03 DIAGNOSIS — H353132 Nonexudative age-related macular degeneration, bilateral, intermediate dry stage: Secondary | ICD-10-CM | POA: Diagnosis not present

## 2020-08-14 ENCOUNTER — Other Ambulatory Visit: Payer: Self-pay

## 2020-08-14 MED ORDER — HYDROCHLOROTHIAZIDE 25 MG PO TABS: ORAL_TABLET | ORAL | 3 refills | Status: DC

## 2020-09-02 DIAGNOSIS — H353132 Nonexudative age-related macular degeneration, bilateral, intermediate dry stage: Secondary | ICD-10-CM | POA: Diagnosis not present

## 2020-10-02 DIAGNOSIS — H353132 Nonexudative age-related macular degeneration, bilateral, intermediate dry stage: Secondary | ICD-10-CM | POA: Diagnosis not present

## 2020-10-07 ENCOUNTER — Encounter: Payer: Self-pay | Admitting: Internal Medicine

## 2020-10-07 NOTE — Progress Notes (Signed)
Future Appointments  Date Time Provider Department Center  10/08/2020 10:30 AM Lucky Cowboy, MD [facility] None  05/01/2021  -   CPE 10:00 AM Lucky Cowboy, MD [facility] None  06/27/2021-  Wellness 10:30 AM Judd Gaudier, NP [facility] None    History of Present Illness:       This very nice 87 y.o. DWF presents for 6 month follow up with HTN, HLD, Pre-Diabetes and Vitamin D Deficiency.        Patient is treated for HTN (1997) & BP has been controlled at home. Today's BP is at goal - 132/72. Patient has CKD3a  (GFR 49) consequent of her HTN.  Patient has had no complaints of any cardiac type chest pain, palpitations, dyspnea Pollyann Kennedy /PND, dizziness, claudication or dependent edema.       Hyperlipidemia is controlled with diet & Crestor. Patient denies myalgias or other med SE's. Last Lipids were not at goal:  Lab Results  Component Value Date   CHOL 188 06/27/2020   HDL 61 06/27/2020   LDLCALC 105 (H) 06/27/2020   TRIG 121 06/27/2020   CHOLHDL 3.1 06/27/2020     Also, the patient has history of PreDiabetes (A1c 6.1% /2010) and has had no symptoms of reactive hypoglycemia, diabetic polys, paresthesias or visual blurring.  Last A1c was not at goal:  Lab Results  Component Value Date   HGBA1C 6.1 (H) 03/19/2020                                                     Further, the patient also has history of Vitamin D Deficiency ("25" /2008) and supplements vitamin D without any suspected side-effects. Last vitamin D was at goal:  Lab Results  Component Value Date   VD25OH 65 03/19/2020    Current Outpatient Medications on File Prior to Visit  Medication Sig   amLODipine 5 MG tablet Takes 1 tablet in the morning   aspirin 81 MG tablet Take  daily.   VITAMIN D 2000 u TABS Taking 6000 units daily   CINNAMON PO Take 2,000 mg by mouth daily.   CVITAMIN B-12  1 tablet daily.   diclofenac 1 % GEL Apply 4 g topically 4 (four) times daily.   fish oil-omega-3 1000 MG  capsule Take 2 g by mouth daily. 1400 mg   hydrochlorothiazide  25 MG tablet Take 1 tablet Daily for Fluid Retention   olmesartan 20 MG tablet Take  1 tablet  Daily for BP   vitamin for her eyes Takes  1 daily   rosuvastatin  5 MG tablet Take 1 tablet daily for Cholesterol.    Allergies  Allergen Reactions   Ace Inhibitors Hyperkalemia    PMHx:   Past Medical History:  Diagnosis Date   Anemia of chronic renal failure    Chronic renal insufficiency    Degenerative joint disease    HTN (hypertension)    Hyperlipidemia    Shingles    Vitamin D deficiency     Immunization History  Administered Date(s) Administered   Influenza Split 01/17/2013   Influenza, High Dose   01/02/2014, 01/16/2015, 01/02/2016, 01/19/2018, 01/05/2019, 01/10/2020   PFIZER SARS-COV-2 Vacc  06/11/2019, 07/02/2019   Pneumococcal  -13 06/08/2015   Pneumococcal  -23 06/04/2010   Pneumococcal-23 01/31/1999   Td 04/01/1995, 12/05/2005, 10/23/2016  No past surgical history on file.  FHx:    Reviewed / unchanged  SHx:    Reviewed / unchanged   Systems Review:  Constitutional: Denies fever, chills, wt changes, headaches, insomnia, fatigue, night sweats, change in appetite. Eyes: Denies redness, blurred vision, diplopia, discharge, itchy, watery eyes.  ENT: Denies discharge, congestion, post nasal drip, epistaxis, sore throat, earache, hearing loss, dental pain, tinnitus, vertigo, sinus pain, snoring.  CV: Denies chest pain, palpitations, irregular heartbeat, syncope, dyspnea, diaphoresis, orthopnea, PND, claudication or edema. Respiratory: denies cough, dyspnea, DOE, pleurisy, hoarseness, laryngitis, wheezing.  Gastrointestinal: Denies dysphagia, odynophagia, heartburn, reflux, water brash, abdominal pain or cramps, nausea, vomiting, bloating, diarrhea, constipation, hematemesis, melena, hematochezia  or hemorrhoids. Genitourinary: Denies dysuria, frequency, urgency, nocturia, hesitancy, discharge,  hematuria or flank pain. Musculoskeletal: Denies arthralgias, myalgias, stiffness, jt. swelling, pain, limping or strain/sprain.  Skin: Denies pruritus, rash, hives, warts, acne, eczema or change in skin lesion(s). Neuro: No weakness, tremor, incoordination, spasms, paresthesia or pain. Psychiatric: Denies confusion, memory loss or sensory loss. Endo: Denies change in weight, skin or hair change.  Heme/Lymph: No excessive bleeding, bruising or enlarged lymph nodes.  Physical Exam  BP 132/72   Pulse 68   Temp (!) 97.5 F (36.4 C)   Resp 16   Ht 5' (1.524 m)   Wt 122 lb 6.4 oz (55.5 kg)   SpO2 98%   BMI 23.90 kg/m   Appears  well nourished, well groomed  and in no distress.  Eyes: PERRLA, EOMs, conjunctiva no swelling or erythema. Sinuses: No frontal/maxillary tenderness ENT/Mouth: EAC's clear, TM's nl w/o erythema, bulging. Nares clear w/o erythema, swelling, exudates. Oropharynx clear without erythema or exudates. Oral hygiene is good. Tongue normal, non obstructing. Hearing intact.  Neck: Supple. Thyroid not palpable. Car 2+/2+ without bruits, nodes or JVD. Chest: Respirations nl with BS clear & equal w/o rales, rhonchi, wheezing or stridor.  Cor: Heart sounds normal w/ regular rate and rhythm without sig. murmurs, gallops, clicks or rubs. Peripheral pulses normal and equal  without edema.  Abdomen: Soft & bowel sounds normal. Non-tender w/o guarding, rebound, hernias, masses or organomegaly.  Lymphatics: Unremarkable.  Musculoskeletal: Full ROM all peripheral extremities, joint stability, 5/5 strength and normal gait.  Skin: Warm, dry without exposed rashes, lesions or ecchymosis apparent.  Neuro: Cranial nerves intact, reflexes equal bilaterally. Sensory-motor testing grossly intact. Tendon reflexes grossly intact.  Pysch: Alert & oriented x 3.  Insight and judgement nl & appropriate. No ideations.  Assessment and Plan:  1. Hyperlipidemia, mixed  - Continue medication,  monitor blood pressure at home.  - Continue DASH diet.  Reminder to go to the ER if any CP,  SOB, nausea, dizziness, severe HA, changes vision/speech.   - CBC with Differential/Platelet - COMPLETE METABOLIC PANEL WITH GFR - Magnesium - TSH  2.  Essential hypertension  - Continue diet/meds, exercise,& lifestyle modifications.  - Continue monitor periodic cholesterol/liver & renal functions   - Lipid panel - TSH  3. Abnormal glucose  - Continue diet, exercise  - Lifestyle modifications.  - Monitor appropriate labs   - Hemoglobin A1c - Insulin, random  4. Vitamin D deficiency  - Continue supplementation   - VITAMIN D 25 Hydroxy   5. Stage 3a chronic kidney disease (HCC)  - COMPLETE METABOLIC PANEL WITH GFR  6. Medication management  - CBC with Differential/Platelet - COMPLETE METABOLIC PANEL WITH GFR - Magnesium - Lipid panel - TSH - Hemoglobin A1c - Insulin, random - VITAMIN D 25 Hydroxy  Discussed  regular exercise, BP monitoring, weight control to achieve/maintain BMI less than 25 and discussed med and SE's. Recommended labs to assess and monitor clinical status with further disposition pending results of labs.  I discussed the assessment and treatment plan with the patient. The patient was provided an opportunity to ask questions and all were answered. The patient agreed with the plan and demonstrated an understanding of the instructions.  I provided over 30 minutes of exam, counseling, chart review and  complex critical decision making.        The patient was advised to call back or seek an in-person evaluation if the symptoms worsen or if the condition fails to improve as anticipated.   Kirtland Bouchard, MD

## 2020-10-07 NOTE — Patient Instructions (Signed)
,= Due to recent changes in healthcare laws, you may see the results of your imaging and laboratory studies on MyChart before your provider has had a chance to review them.  We understand that in some cases there may be results that are confusing or concerning to you. Not all laboratory results come back in the same time frame and the provider may be waiting for multiple results in order to interpret others.  Please give us 48 hours in order for your provider to thoroughly review all the results before contacting the office for clarification of your results.  ++++++++++++++++++++++++++++++++++  Vit D  & Vit C 1,000 mg   are recommended to help protect  against the Covid-19 and other Corona viruses.    Also it's recommended  to take  Zinc 50 mg  to help  protect against the Covid-19   and best place to get  is also on [employer]  and don't pay more than 6-8 cents /pill !   ===================================== Coronavirus (COVID-19) Are you at risk?  Are you at risk for the Coronavirus (COVID-19)?  To be considered HIGH RISK for Coronavirus (COVID-19), you have to meet the following criteria:  Traveled to [place], [place], [place], [place] or [place]; or in the [place] to [place], [place], [place]  or [place]; and have fever, cough, and shortness of breath within the last 2 weeks of travel OR Been in close contact with a person diagnosed with COVID-19 within the last 2 weeks and have  fever, cough,and shortness of breath  IF YOU DO NOT MEET THESE CRITERIA, YOU ARE CONSIDERED LOW RISK FOR COVID-19.  What to do if you are HIGH RISK for COVID-19?  If you are having a medical emergency, call 911. Seek medical care right away. Before you go to a doctor's office, urgent care or emergency department,  call ahead and tell them about your recent travel, contact with someone diagnosed with COVID-19   and your symptoms.  You should receive instructions from your physician's office  regarding next steps of care.  When you arrive at healthcare provider, tell the healthcare staff immediately you have returned from  visiting [place], [place], [place], [place] or [place]; or traveled in the [place] to [place], [place],  [place] or [place] in the last two weeks or you have been in close contact with a person diagnosed with  COVID-19 in the last 2 weeks.   Tell the health care staff about your symptoms: fever, cough and shortness of breath. After you have been seen by a medical provider, you will be either: Tested for (COVID-19) and discharged home on quarantine except to seek medical care if  symptoms worsen, and asked to  Stay home and avoid contact with others until you get your results (4-5 days)  Avoid travel on public transportation if possible (such as bus, train, or airplane) or Sent to the Emergency Department by EMS for evaluation, COVID-19 testing  and  possible admission depending on your condition and test results.  What to do if you are LOW RISK for COVID-19?  Reduce your risk of any infection by using the same precautions used for avoiding the common cold or flu:  Wash your hands often with soap and warm water for at least 20 seconds.  If soap and water are not readily available,  use an alcohol-based hand sanitizer with at least 60% alcohol.  If coughing or sneezing, cover your mouth and nose by   coughing or sneezing into the elbow areas of your shirt or coat,  into a tissue or into your sleeve (not your hands). Avoid shaking hands with others and consider head nods or verbal greetings only. Avoid touching your eyes, nose, or mouth with unwashed hands.  Avoid close contact with people who are sick. Avoid places or events with large numbers of people in one location, like concerts or sporting events. Carefully consider travel plans you have or are making. If you are planning any travel outside or inside the [place], visit the CDC's Travelers' Health  webpage for the latest health notices. If you have some symptoms but not all symptoms, continue to monitor at home and seek medical attention  if your symptoms worsen. If you are having a medical emergency, call 911.   ++++++++++++++++++++++++++++++++ Recommend Adult Low Dose Aspirin or  coated  Aspirin 81 mg daily  To reduce risk of Colon Cancer 40 %,  Skin Cancer 26 % ,  Melanoma 46%  and  Pancreatic cancer 60% ++++++++++++++++++++++++++++++++ Vitamin D goal  is between 70-100.  Please make sure that you are taking your Vitamin D as directed.  It is very important as a natural anti-inflammatory  helping hair, skin, and nails, as well as reducing stroke and heart attack risk.  It helps your bones and helps with mood. It also decreases numerous cancer risks so please take it as directed.  Low Vit D is associated with a 200-300% higher risk for CANCER  and 200-300% higher risk for HEART   ATTACK  &  STROKE.   ...................................... It is also associated with higher death rate at younger ages,  autoimmune diseases like Rheumatoid arthritis, Lupus, Multiple Sclerosis.    Also many other serious conditions, like depression, Alzheimer's Dementia, infertility, muscle aches, fatigue, fibromyalgia - just to name a few. ++++++++++++++++++++ Recommend the book "The END of DIETING" by Dr Joel Fuhrman  & the book "The END of DIABETES " by Dr Joel Fuhrman At [employer] - get book & Audio CD's    Being diabetic has a  300% increased risk for heart attack, stroke, cancer, and alzheimer- type vascular dementia. It is very important that you work harder with diet by avoiding all foods that are white. Avoid white rice (brown & wild rice is OK), white potatoes (sweetpotatoes in moderation is OK), White bread or wheat bread or anything made out of white flour like bagels, donuts, rolls, buns, biscuits, cakes, pastries, cookies, pizza crust, and pasta (made from white flour & egg whites)  - vegetarian pasta or spinach or wheat pasta is OK. Multigrain breads like Arnold's or Pepperidge [employer], or multigrain sandwich thins or flatbreads.  Diet, exercise and weight loss can reverse and cure diabetes in the early stages.  Diet, exercise and weight loss is very important in the control and prevention of complications of diabetes which affects every system in your body, ie. Brain - dementia/stroke, eyes - glaucoma/blindness, heart - heart attack/heart failure, kidneys - dialysis, stomach - gastric paralysis, intestines - malabsorption, nerves - severe painful neuritis, circulation - gangrene & loss of a leg(s), and finally cancer and Alzheimers.    I recommend avoid fried & greasy foods,  sweets/candy, white rice (brown or wild rice or Quinoa is OK), white potatoes (sweet potatoes are OK) - anything made from white flour - bagels, doughnuts, rolls, buns, biscuits,white and wheat breads, pizza crust and traditional pasta made of white flour & egg white(vegetarian pasta or spinach or wheat pasta is   OK).  Multi-grain bread is OK - like multi-grain flat bread or sandwich thins. Avoid alcohol in excess. Exercise is also important.    Eat all the vegetables you want - avoid meat, especially red meat and dairy - especially cheese.  Cheese is the most concentrated form of trans-fats which is the worst thing to clog up our arteries. Veggie cheese is OK which can be found in the fresh produce section at [employer] or [employer] or [employer]  +++++++++++++++++++++ DASH Eating Plan  DASH stands for "Dietary Approaches to Stop Hypertension."   The DASH eating plan is a healthy eating plan that has been shown to reduce high blood pressure (hypertension). Additional health benefits may include reducing the risk of type 2 diabetes mellitus, heart disease, and stroke. The DASH eating plan may also help with weight loss. WHAT DO I NEED TO KNOW ABOUT THE DASH EATING PLAN? For the DASH eating plan, you will  follow these general guidelines: Choose foods with a percent daily value for sodium of less than 5% (as listed on the food label). Use salt-free seasonings or herbs instead of table salt or sea salt. Check with your health care provider or pharmacist before using salt substitutes. Eat lower-sodium products, often labeled as "lower sodium" or "no salt added." Eat fresh foods. Eat more vegetables, fruits, and low-fat dairy products. Choose whole grains. Look for the word "whole" as the first word in the ingredient list. Choose fish  Limit sweets, desserts, sugars, and sugary drinks. Choose heart-healthy fats. Eat veggie cheese  Eat more home-cooked food and less restaurant, buffet, and fast food. Limit fried foods. Cook foods using methods other than frying. Limit canned vegetables. If you do use them, rinse them well to decrease the sodium. When eating at a restaurant, ask that your food be prepared with less salt, or no salt if possible.                      WHAT FOODS CAN I EAT? Read Dr Joel Fuhrman's books on The End of Dieting & The End of Diabetes  Grains Whole grain or whole wheat bread. Brown rice. Whole grain or whole wheat pasta. Quinoa, bulgur, and whole grain cereals. Low-sodium cereals. Corn or whole wheat flour tortillas. Whole grain cornbread. Whole grain crackers. Low-sodium crackers.  Vegetables Fresh or frozen vegetables (raw, steamed, roasted, or grilled). Low-sodium or reduced-sodium tomato and vegetable juices. Low-sodium or reduced-sodium tomato sauce and paste. Low-sodium or reduced-sodium canned vegetables.   Fruits All fresh, canned (in natural juice), or frozen fruits.  Protein Products  All fish and seafood.  Dried beans, peas, or lentils. Unsalted nuts and seeds. Unsalted canned beans.  Dairy Low-fat dairy products, such as skim or 1% milk, 2% or reduced-fat cheeses, low-fat ricotta or cottage cheese, or plain low-fat yogurt. Low-sodium or reduced-sodium  cheeses.  Fats and Oils Tub margarines without trans fats. Light or reduced-fat mayonnaise and salad dressings (reduced sodium). Avocado. Safflower, olive, or canola oils. Natural peanut or almond butter.  Other Unsalted popcorn and pretzels. The items listed above may not be a complete list of recommended foods or beverages. Contact your dietitian for more options.  +++++++++++++++  WHAT FOODS ARE NOT RECOMMENDED? Grains/ White flour or wheat flour White bread. White pasta. White rice. Refined cornbread. Bagels and croissants. Crackers that contain trans fat.  Vegetables  Creamed or fried vegetables. Vegetables in a . Regular canned vegetables. Regular canned tomato sauce and paste. Regular tomato and   vegetable juices.  Fruits Dried fruits. Canned fruit in light or heavy syrup. Fruit juice.  Meat and Other Protein Products Meat in general - RED meat & White meat.  Fatty cuts of meat. Ribs, chicken wings, all processed meats as bacon, sausage, bologna, salami, fatback, hot dogs, bratwurst and packaged luncheon meats.  Dairy Whole or 2% milk, cream, half-and-half, and cream cheese. Whole-fat or sweetened yogurt. Full-fat cheeses or blue cheese. Non-dairy creamers and whipped toppings. Processed cheese, cheese spreads, or cheese curds.  Condiments Onion and garlic salt, seasoned salt, table salt, and sea salt. Canned and packaged gravies. Worcestershire sauce. Tartar sauce. Barbecue sauce. Teriyaki sauce. Soy sauce, including reduced sodium. Steak sauce. Fish sauce. Oyster sauce. Cocktail sauce. Horseradish. Ketchup and mustard. Meat flavorings and tenderizers. Bouillon cubes. Hot sauce. Tabasco sauce. Marinades. Taco seasonings. Relishes.  Fats and Oils Butter, stick margarine, lard, shortening and bacon fat. Coconut, palm kernel, or palm oils. Regular salad dressings.  Pickles and olives. Salted popcorn and pretzels.  The items listed above may not be a complete list of foods and  beverages to avoid.  

## 2020-10-08 ENCOUNTER — Ambulatory Visit (INDEPENDENT_AMBULATORY_CARE_PROVIDER_SITE_OTHER): Payer: PPO | Admitting: Internal Medicine

## 2020-10-08 ENCOUNTER — Other Ambulatory Visit: Payer: Self-pay

## 2020-10-08 ENCOUNTER — Encounter: Payer: Self-pay | Admitting: Internal Medicine

## 2020-10-08 VITALS — BP 132/72 | HR 68 | Temp 97.5°F | Resp 16 | Ht 60.0 in | Wt 122.4 lb

## 2020-10-08 DIAGNOSIS — Z79899 Other long term (current) drug therapy: Secondary | ICD-10-CM | POA: Diagnosis not present

## 2020-10-08 DIAGNOSIS — N1831 Chronic kidney disease, stage 3a: Secondary | ICD-10-CM

## 2020-10-08 DIAGNOSIS — E559 Vitamin D deficiency, unspecified: Secondary | ICD-10-CM | POA: Diagnosis not present

## 2020-10-08 DIAGNOSIS — R7309 Other abnormal glucose: Secondary | ICD-10-CM

## 2020-10-08 DIAGNOSIS — E782 Mixed hyperlipidemia: Secondary | ICD-10-CM | POA: Diagnosis not present

## 2020-10-08 DIAGNOSIS — I10 Essential (primary) hypertension: Secondary | ICD-10-CM

## 2020-10-08 MED ORDER — ROSUVASTATIN CALCIUM 5 MG PO TABS: ORAL_TABLET | ORAL | 3 refills | Status: DC

## 2020-10-09 LAB — INSULIN, RANDOM: Insulin: 10.4 u[IU]/mL

## 2020-10-09 LAB — CBC WITH DIFFERENTIAL/PLATELET
Absolute Monocytes: 419 cells/uL (ref 200–950)
Basophils Absolute: 42 cells/uL (ref 0–200)
Basophils Relative: 0.8 %
Eosinophils Absolute: 90 cells/uL (ref 15–500)
Eosinophils Relative: 1.7 %
HCT: 35.1 % (ref 35.0–45.0)
Hemoglobin: 11.2 g/dL — ABNORMAL LOW (ref 11.7–15.5)
Lymphs Abs: 1601 cells/uL (ref 850–3900)
MCH: 27.5 pg (ref 27.0–33.0)
MCHC: 31.9 g/dL — ABNORMAL LOW (ref 32.0–36.0)
MCV: 86 fL (ref 80.0–100.0)
MPV: 12 fL (ref 7.5–12.5)
Monocytes Relative: 7.9 %
Neutro Abs: 3148 cells/uL (ref 1500–7800)
Neutrophils Relative %: 59.4 %
Platelets: 213 10*3/uL (ref 140–400)
RBC: 4.08 10*6/uL (ref 3.80–5.10)
RDW: 13.9 % (ref 11.0–15.0)
Total Lymphocyte: 30.2 %
WBC: 5.3 10*3/uL (ref 3.8–10.8)

## 2020-10-09 LAB — LIPID PANEL
Cholesterol: 208 mg/dL — ABNORMAL HIGH (ref <200)
HDL: 58 mg/dL (ref >=50)
LDL Cholesterol (Calc): 125 mg/dL (calc) — ABNORMAL HIGH
Non-HDL Cholesterol (Calc): 150 mg/dL (calc) — ABNORMAL HIGH (ref <130)
Total CHOL/HDL Ratio: 3.6 (calc) (ref <5.0)
Triglycerides: 142 mg/dL (ref <150)

## 2020-10-09 LAB — COMPLETE METABOLIC PANEL WITH GFR
AG Ratio: 1.7 (calc) (ref 1.0–2.5)
ALT: 10 U/L (ref 6–29)
AST: 17 U/L (ref 10–35)
Albumin: 4.5 g/dL (ref 3.6–5.1)
Alkaline phosphatase (APISO): 74 U/L (ref 37–153)
BUN/Creatinine Ratio: 22 (calc) (ref 6–22)
BUN: 24 mg/dL (ref 7–25)
CO2: 27 mmol/L (ref 20–32)
Calcium: 9.8 mg/dL (ref 8.6–10.4)
Chloride: 102 mmol/L (ref 98–110)
Creat: 1.1 mg/dL — ABNORMAL HIGH (ref 0.60–0.95)
Globulin: 2.6 g/dL (calc) (ref 1.9–3.7)
Glucose, Bld: 106 mg/dL — ABNORMAL HIGH (ref 65–99)
Potassium: 4.8 mmol/L (ref 3.5–5.3)
Sodium: 139 mmol/L (ref 135–146)
Total Bilirubin: 0.5 mg/dL (ref 0.2–1.2)
Total Protein: 7.1 g/dL (ref 6.1–8.1)
eGFR: 49 mL/min/{1.73_m2} — ABNORMAL LOW (ref >=60)

## 2020-10-09 LAB — TSH: TSH: 1.92 mIU/L (ref 0.40–4.50)

## 2020-10-09 LAB — MAGNESIUM: Magnesium: 2.1 mg/dL (ref 1.5–2.5)

## 2020-10-09 LAB — HEMOGLOBIN A1C
Hgb A1c MFr Bld: 5.8 % of total Hgb — ABNORMAL HIGH (ref <5.7)
Mean Plasma Glucose: 120 mg/dL
eAG (mmol/L): 6.6 mmol/L

## 2020-10-09 LAB — VITAMIN D 25 HYDROXY (VIT D DEFICIENCY, FRACTURES): Vit D, 25-Hydroxy: 59 ng/mL (ref 30–100)

## 2020-10-10 NOTE — Progress Notes (Signed)
============================================================ ============================================================  -    CBC shows mild chronic anemia is stable  ============================================================ ============================================================  -  Kidney Functions still Stage 3b   and Stable   -   Kidney functions still look a little dehydrated    Very important to drink adequate amounts of fluids to prevent permanent damage    - Recommend drink at least 6 bottles (16 ounces) of fluids /water /day = 96 Oz ~100 oz  - 100 oz = 3,000 cc or 3 liters / day  - >> That's 1 &1/2 bottles of a 2 liter soda bottle /day !  ============================================================ ============================================================  -  Total Chol = 208 and LDL Chol = 125 - Both too high   - Recommend a stricter low cholesterol diet   - Cholesterol only comes from animal sources  - ie. meat, dairy, egg yolks  - Eat all the vegetables you want.  - Avoid meat, especially red meat - Beef AND Pork .  - Avoid cheese & dairy - milk & ice cream.     - Cheese is the most concentrated form of trans-fats which  is the worst thing to clog up our arteries.   - Veggie cheese is OK which can be found in the fresh  produce section at [employer] or [employer] or [employer] ============================================================ ============================================================  -  A1c is better - down from 6.1%   to now 5.8%                        - getting closer to nonDiabetic Normal range less than 5.7% ============================================================ ============================================================  - Avoid Sweets, Candy & White Stuff   - White Rice, White [place], White Flour  - Breads &   Pasta ============================================================ ============================================================  -  Vitamin D = 59 - Great   ============================================================ ============================================================  -  All Else - CBC - Electrolytes - Liver - Magnesium & Thyroid    - all  Normal / OK ============================================================

## 2020-11-01 DIAGNOSIS — H353132 Nonexudative age-related macular degeneration, bilateral, intermediate dry stage: Secondary | ICD-10-CM | POA: Diagnosis not present

## 2020-12-01 DIAGNOSIS — H353132 Nonexudative age-related macular degeneration, bilateral, intermediate dry stage: Secondary | ICD-10-CM | POA: Diagnosis not present

## 2020-12-31 DIAGNOSIS — H353132 Nonexudative age-related macular degeneration, bilateral, intermediate dry stage: Secondary | ICD-10-CM | POA: Diagnosis not present

## 2021-01-30 DIAGNOSIS — H353132 Nonexudative age-related macular degeneration, bilateral, intermediate dry stage: Secondary | ICD-10-CM | POA: Diagnosis not present

## 2021-02-09 IMAGING — MG DIGITAL SCREENING BILAT W/ TOMO W/ CAD
8 series · 9 of 24 positions shown · non-contrast
Comparison: Previous exam(s).

CLINICAL DATA: Screening.

EXAM:
DIGITAL SCREENING BILATERAL MAMMOGRAM WITH TOMO AND CAD

[L CC synth-2D]
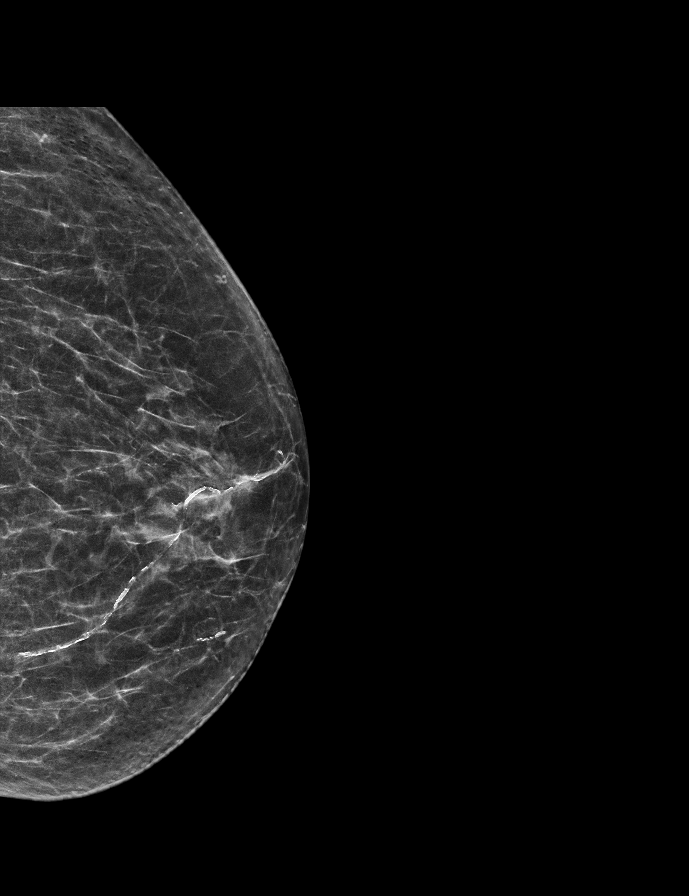

[L MLO synth-2D]
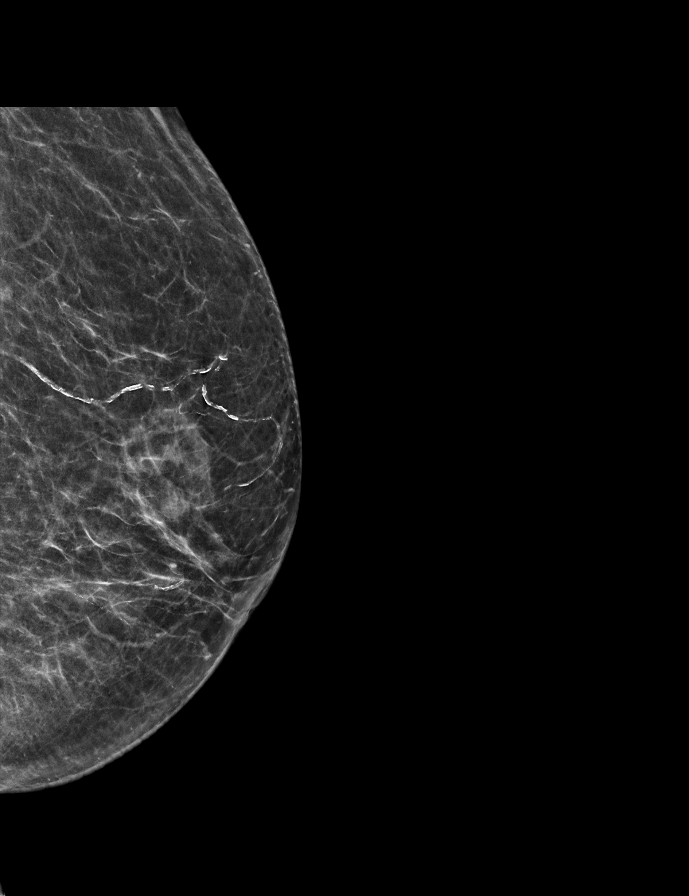

[R CC synth-2D]
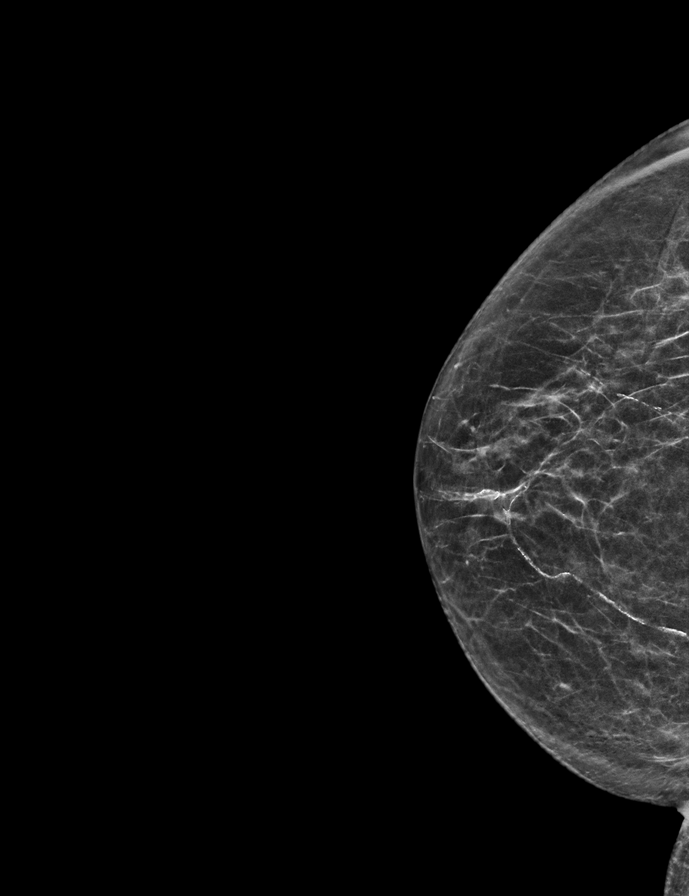

[R MLO synth-2D]
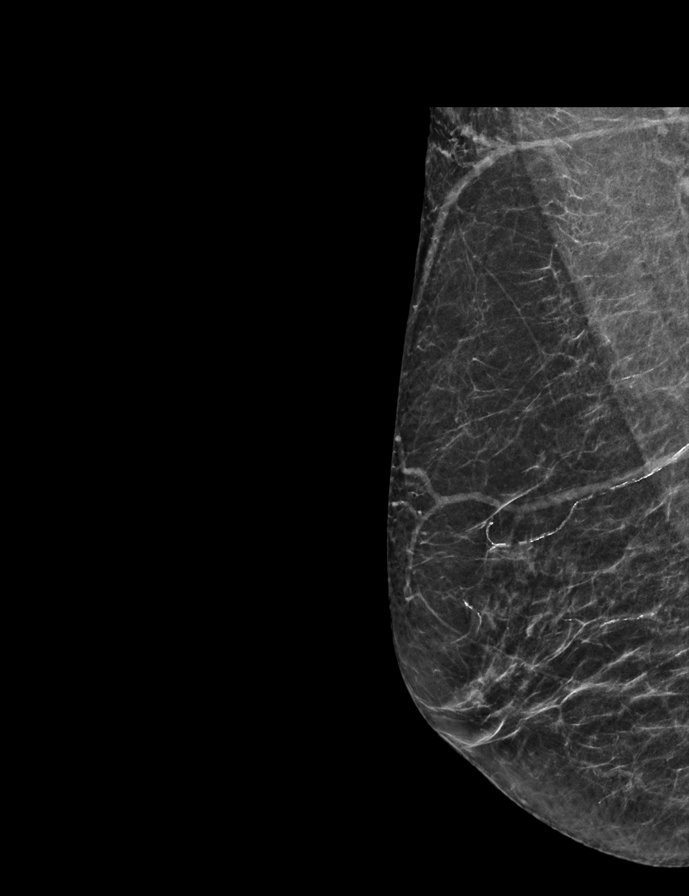

[L CC tomo · 2 of 54 frames shown]
[frame 18/54]
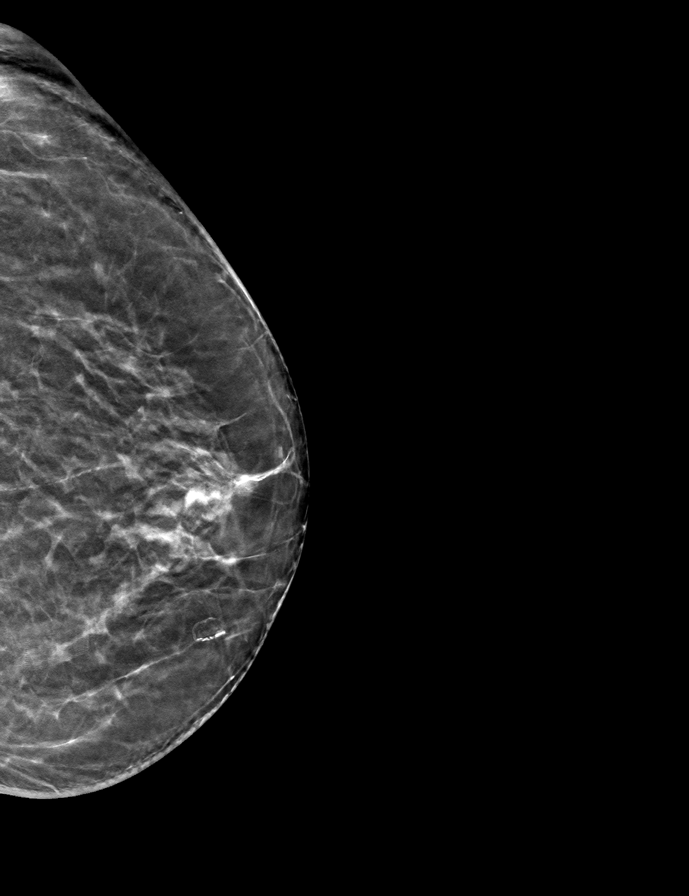
[frame 27/54]
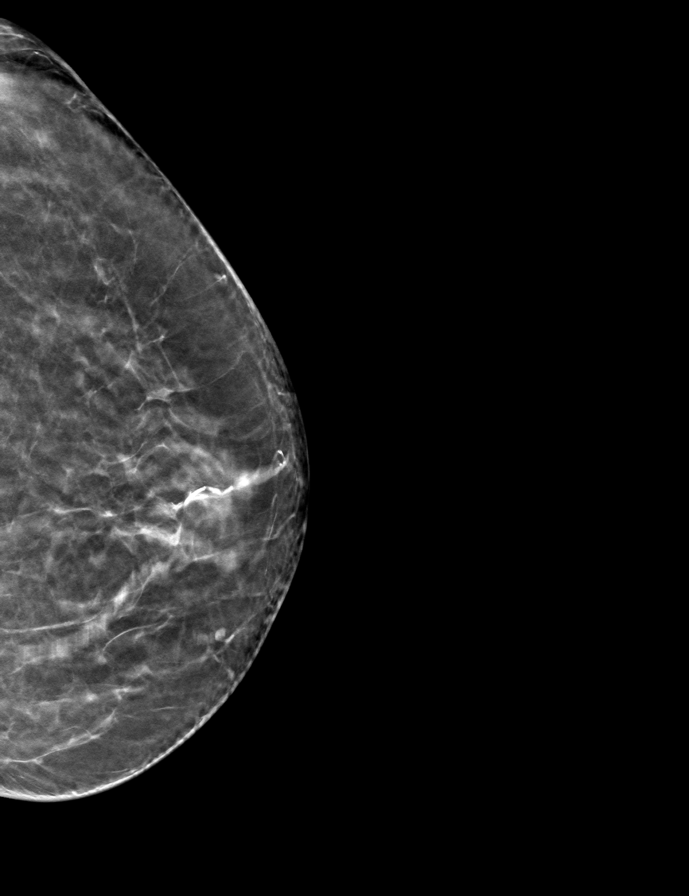

[R CC tomo · tomo slice 27/52.0]
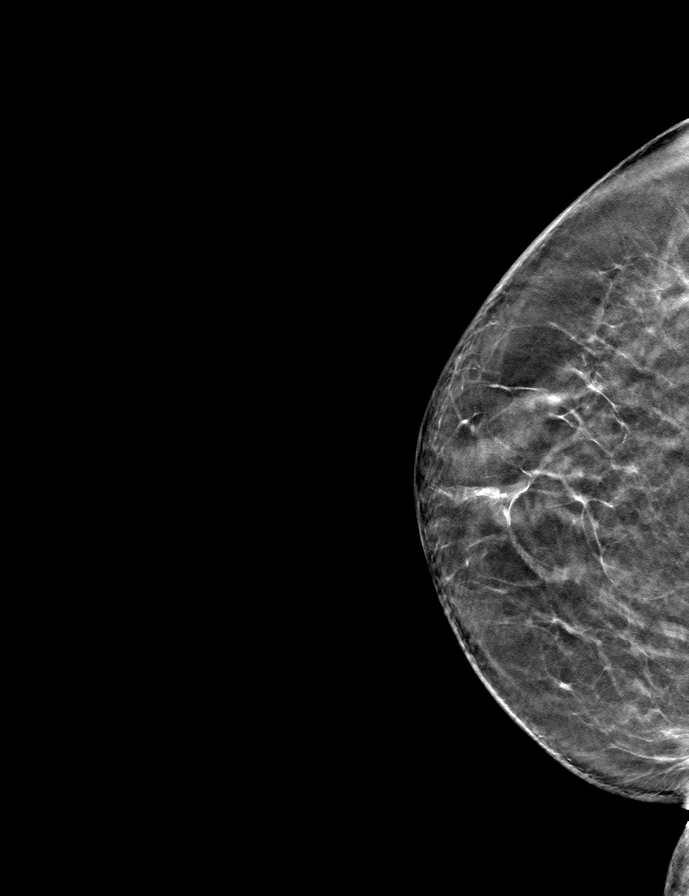

[L MLO tomo · tomo slice 29/57.0]
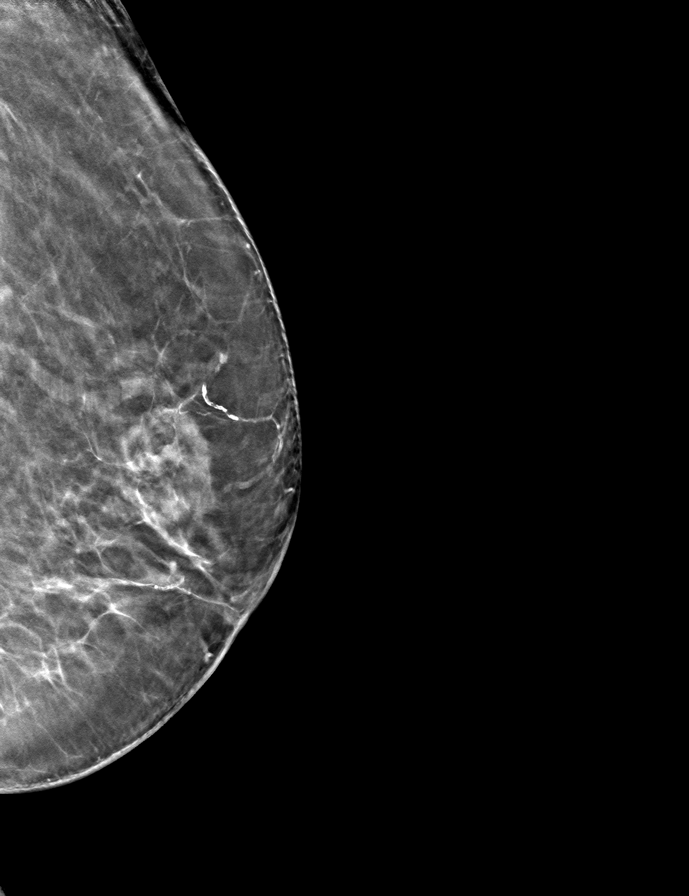

[R MLO tomo · tomo slice 26/51.0]
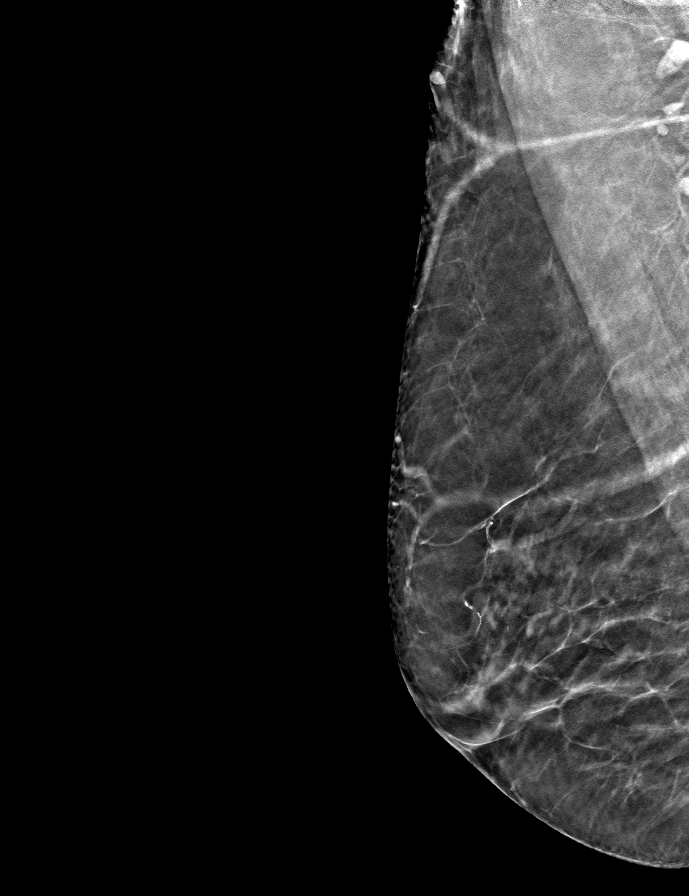

[9 of 24 positions shown; findings below may reference images not displayed]

ACR Breast Density Category b: There are scattered areas of
fibroglandular density.
FINDINGS: There are no findings suspicious for malignancy. Images were
processed with CAD.
IMPRESSION: No mammographic evidence of malignancy. A result letter of this
screening mammogram will be mailed directly to the patient.

RECOMMENDATION:
Screening mammogram in one year. (Code:[id number])

BI-RADS CATEGORY  1: Negative.

## 2021-03-01 DIAGNOSIS — H353132 Nonexudative age-related macular degeneration, bilateral, intermediate dry stage: Secondary | ICD-10-CM | POA: Diagnosis not present

## 2021-03-31 DIAGNOSIS — H353132 Nonexudative age-related macular degeneration, bilateral, intermediate dry stage: Secondary | ICD-10-CM | POA: Diagnosis not present

## 2021-04-04 ENCOUNTER — Encounter: Payer: PPO | Admitting: Internal Medicine

## 2021-04-30 DIAGNOSIS — H353132 Nonexudative age-related macular degeneration, bilateral, intermediate dry stage: Secondary | ICD-10-CM | POA: Diagnosis not present

## 2021-04-30 NOTE — Progress Notes (Signed)
CPE AND FOLLOW UP  Assessment:   Encounter for Annual Physical Exam with abnormal findings Due annually  Health Maintenance reviewed Healthy lifestyle reviewed and goals set  Discussed mammogram last in 2021, declines further.  She declines DEXA follow up   Essential hypertension Well controlled with current medications Monitor blood pressure at home; call if consistently over 130/80 Continue DASH diet.   Reminder to go to the ER if any CP, SOB, nausea, dizziness, severe HA, changes vision/speech, left arm numbness and tingling and jaw pain.  Osteoporosis, unspecified osteoporosis type, unspecified pathological fracture presence Monitor Declines DEXA today, wouldn't do bisphosphonates or other medication treatments Discussed high calcium diet, vitamin D, weight bearing   CKD Stage III  (GFR 30-59 ml/min) Increase fluids, avoid NSAIDS, monitor sugars, will monitor closely She prefers to avoid nephrology referral unless absolutely necessary   Anemia r/t CKD III Monitor CBC  Hyperlipidemia, unspecified hyperlipidemia type Titrate statin for LDL goal <100 Discussed dietary and exercise modifications Low fat diet  Prediabetes / abnormal glucose Discussed disease and risks Discussed diet/exercise, weight management  A1C q2m  Vitamin D deficiency Continue supplement Check level   Primary osteoarthritis involving multiple joints Tylenol, avoid oral NSAIDs Continue Voltaren gel QID as needed refill sent today. Doing well with this.  Medication management Monitor   Orders Placed This Encounter  Procedures   CBC with Differential/Platelet   COMPLETE METABOLIC PANEL WITH GFR   Magnesium   Lipid panel   TSH   Hemoglobin A1c   VITAMIN D 25 Hydroxy (Vit-D Deficiency, Fractures)   Iron, TIBC and Ferritin Panel   Vitamin B12   Microalbumin / creatinine urine ratio   Urinalysis, Routine w reflex microscopic   EKG 12-Lead     Further disposition pending results if  labs check today. Discussed med's effects and SE's.   Over 30 minutes of face to face interview, exam, counseling, chart review, and critical decision making was performed.     Plan:   During the course of the visit the patient was educated and counseled about appropriate screening and preventive services including:   Pneumococcal vaccine  Influenza vaccine Td vaccine Prevnar 13 Screening electrocardiogram Screening mammography Bone densitometry screening Colorectal cancer screening Diabetes screening Glaucoma screening Nutrition counseling  Advanced directives: given info/requested copies   Subjective:   Ann [name] is a 88 y.o. pleasant female who presents for CPE. She has Essential hypertension; Hyperlipidemia, mixed; Vitamin D deficiency; Degenerative joint disease; Anemia of chronic renal failure, stage 3 (moderate) ([place]); Medication management; Other abnormal glucose (prediabetes); Osteoporosis; Vitamin B12 deficiency; CKD (chronic kidney disease) stage 3, GFR 30-59 ml/min (HCC); and Urinary incontinence on their problem list.  She is widowed, 3 children 2 living, 3 grandchildren and 6 great grandchildren. She is retired from [employer].   Reports she is taking tylenol intermittently for her bilateral knees and R shoulder arthritis. She also uses diclofenac gel as needed.   She does not have any health or medication concerns today.  BMI is Body mass index is 22.42 kg/m., she has been working on diet and exercise, rides stationary bike 20 min most days. She is very active and works in her yard and garden. She has switched to [employer] killer bread.  Wt Readings from Last 3 Encounters:  05/01/21 120 lb 9.6 oz (54.7 kg)  10/08/20 122 lb 6.4 oz (55.5 kg)  06/27/20 122 lb (55.3 kg)   Her blood pressure has been controlled at home, today their BP is  BP: 114/60   doing well with this. She does workout. She denies chest pain, shortness of breath, dizziness.    She is on  cholesterol medication (Rosuvastatin 5mg  daily) and denies myalgias. Her cholesterol is not at goal. Both parents ? Had CVA later in life, she wishes to continue on ASA. The cholesterol last visit was:   Lab Results  Component Value Date   CHOL 208 (H) 10/08/2020   HDL 58 10/08/2020   LDLCALC 125 (H) 10/08/2020   TRIG 142 10/08/2020   CHOLHDL 3.6 10/08/2020    She has been working on diet and exercise for prediabetes, and denies increased appetite, nausea, paresthesia of the feet, polydipsia, polyuria and visual disturbances. Last A1C in the office was:  Lab Results  Component Value Date   HGBA1C 5.8 (H) 10/08/2020    She has CKD IIIa monitored at this office:  Lab Results  Component Value Date   GFRNONAA 54 (L) 06/27/2020   She has chronic mild anemia, of chronic disease, stable:  CBC Latest Ref Rng & Units 10/08/2020 06/27/2020 03/19/2020  WBC 3.8 - 10.8 Thousand/uL 5.3 5.6 5.3  Hemoglobin 11.7 - 15.5 g/dL 11.2(L) 10.8(L) 11.0(L)  Hematocrit 35.0 - 45.0 % 35.1 34.0(L) 33.5(L)  Platelets 140 - 400 Thousand/uL 213 199 198   Lab Results  Component Value Date   IRON 89 12/07/2019   TIBC 328 09/03/2017   FERRITIN 76 12/07/2019   She takes B12 SL, unsure of dose.  Lab Results  Component Value Date   VITAMINB12 >2,000 (H) 03/19/2020   Patient is on Vitamin D supplement.   Lab Results  Component Value Date   VD25OH 59 10/08/2020        Medication Review Current Outpatient Medications on File Prior to Visit  Medication Sig Dispense Refill   amLODipine (NORVASC) 5 MG tablet Take 1 tablet 2 x /day ofr  BP (Patient taking differently: Takes 1 tablet in the morning and 1/4 at night) 180 tablet 3   aspirin 81 MG tablet Take 81 mg by mouth daily.     Cholecalciferol (VITAMIN D3) 50 MCG (2000 UT) TABS Taking 6000 units daily 30 tablet    Cyanocobalamin (VITAMIN B-12 PO) 1 tablet daily.     fish oil-omega-3 fatty acids 1000 MG capsule Take 2 g by mouth daily. 1400 mg      hydrochlorothiazide (HYDRODIURIL) 25 MG tablet Take 1 tablet Daily for Fluid Retention (Patient taking differently: Take 1 tablet Daily for Fluid Retention as needed) 90 tablet 3   olmesartan (BENICAR) 20 MG tablet Take  1 tablet  Daily for BP 90 tablet 0   OVER THE COUNTER MEDICATION Takes vitamin for her eyes 1 daily     CINNAMON PO Take 2,000 mg by mouth daily. (Patient not taking: Reported on 05/01/2021)     No current facility-administered medications on file prior to visit.    Current Problems (verified) Patient Active Problem List   Diagnosis Date Noted   Urinary incontinence 05/23/2019   CKD (chronic kidney disease) stage 3, GFR 30-59 ml/min (HCC) 05/20/2019   Vitamin B12 deficiency 12/06/2017   Osteoporosis 10/15/2015   Other abnormal glucose (prediabetes) 01/01/2014   Medication management 06/27/2013   Essential hypertension    Hyperlipidemia, mixed    Vitamin D deficiency    Degenerative joint disease    Anemia of chronic renal failure, stage 3 (moderate) (HCC)     Screening Tests Immunization History  Administered Date(s) Administered   Influenza Split 01/17/2013  Influenza, High Dose Seasonal PF 01/02/2014, 01/16/2015, 01/02/2016, 01/19/2018, 01/05/2019, 01/10/2020   PFIZER(Purple Top)SARS-COV-2 Vaccination 06/11/2019, 07/02/2019   Pneumococcal Conjugate-13 06/08/2015   Pneumococcal Polysaccharide-23 06/04/2010   Pneumococcal-Unspecified 01/31/1999   Td 04/01/1995, 12/05/2005, 10/23/2016   Health Maintenance  Topic Date Due   Zoster Vaccines- Shingrix (1 of 2) Never done   COVID-19 Vaccine (3 - Pfizer risk series) 07/30/2019   INFLUENZA VACCINE  10/29/2020   TETANUS/TDAP  10/24/2026   Pneumonia Vaccine 54+ Years old  Completed   DEXA SCAN  Completed   HPV VACCINES  Aged Out    Last colonoscopy:  2006 declines another due to age Last mammogram: 10/12/2019, declines further mammograms at this time, would decline treatments  DEXA: 09/2015  Osteoporosis -  declines further, would not take any treatments  Names of Other Physician/Practitioners you currently use: 1. [facility]- here for primary care 2. Dr. Herbert Deaner, eye doctor, last visit last visit 2022 has scheduled next week  3. Dr. Gloriann Loan, dentist, last visit 2022, goes q23m  Patient Care Team: Unk Pinto, MD as PCP - General (Internal Medicine)  Allergies Allergies  Allergen Reactions   Ace Inhibitors     Hyperkalemia     SURGICAL HISTORY She  has a past surgical history that includes Cataract extraction (Bilateral). FAMILY HISTORY Her family history includes Breast cancer in her daughter; CVA in her father and mother; Cerebral aneurysm in her sister. SOCIAL HISTORY She  reports that she has never smoked. She has never used smokeless tobacco. She reports current alcohol use of about 1.0 standard drink per week. She reports that she does not use drugs.   Review of Systems  Constitutional:  Negative for malaise/fatigue and weight loss.  HENT:  Negative for hearing loss and tinnitus.   Eyes:  Negative for blurred vision and double vision.  Respiratory:  Negative for cough, shortness of breath and wheezing.   Cardiovascular:  Negative for chest pain, palpitations, orthopnea, claudication and leg swelling.  Gastrointestinal:  Negative for abdominal pain, blood in stool, constipation, diarrhea, heartburn, melena, nausea and vomiting.  Genitourinary: Negative.   Musculoskeletal:  Negative for joint pain and myalgias.  Skin:  Negative for rash.  Neurological:  Negative for dizziness, tingling, sensory change, weakness and headaches.  Endo/Heme/Allergies:  Negative for polydipsia.  Psychiatric/Behavioral: Negative.    All other systems reviewed and are negative.   Objective:   Today's Vitals   05/01/21 0957  BP: 114/60  Pulse: 71  Temp: 97.9 F (36.6 C)  SpO2: 96%  Weight: 120 lb 9.6 oz (54.7 kg)  Height: 5' 1.5" (1.562 m)    Body  mass index is 22.42 kg/m.  General appearance: alert, no distress, WD/WN,  female HEENT: normocephalic, sclerae anicteric, TMs pearly, nares patent, no discharge or erythema, pharynx normal Oral cavity: MMM, no lesions Neck: supple, no lymphadenopathy, no thyromegaly, no masses Heart: RRR, normal S1, S2, no murmurs Lungs: CTA bilaterally, no wheezes, rhonchi, or rales Abdomen: +bs, soft, non tender, non distended, no masses, no hepatomegaly, no splenomegaly Musculoskeletal: nontender, no swelling, no obvious deformity. She has limited ROM to L shoulder, abduction past 90 degrees is limited (per patient ongoing for multiple years, declined surgery)  Extremities: no edema, no cyanosis, no clubbing  Pulses: 2+ symmetric, upper and lower extremities, normal cap refill Neurological: alert, oriented x 3, CN2-12 intact, strength normal upper extremities and lower extremities, sensation normal throughout, DTRs 2+ throughout, no cerebellar signs, gait normal Psychiatric: normal affect, behavior  normal, pleasant  Breasts: breasts appear normal, no suspicious masses, no skin or nipple changes or axillary nodes. Gyn: defer, no concerns Rectal: defer  EKG: sinus bradycardia, [id number]  Izora Ribas, NP   05/01/2021

## 2021-05-01 ENCOUNTER — Ambulatory Visit (INDEPENDENT_AMBULATORY_CARE_PROVIDER_SITE_OTHER): Payer: PPO | Admitting: Adult Health

## 2021-05-01 ENCOUNTER — Encounter: Payer: Self-pay | Admitting: Adult Health

## 2021-05-01 ENCOUNTER — Other Ambulatory Visit: Payer: Self-pay

## 2021-05-01 VITALS — BP 114/60 | HR 71 | Temp 97.9°F | Ht 61.5 in | Wt 120.6 lb

## 2021-05-01 DIAGNOSIS — D631 Anemia in chronic kidney disease: Secondary | ICD-10-CM

## 2021-05-01 DIAGNOSIS — N1831 Chronic kidney disease, stage 3a: Secondary | ICD-10-CM | POA: Diagnosis not present

## 2021-05-01 DIAGNOSIS — Z131 Encounter for screening for diabetes mellitus: Secondary | ICD-10-CM

## 2021-05-01 DIAGNOSIS — Z1329 Encounter for screening for other suspected endocrine disorder: Secondary | ICD-10-CM

## 2021-05-01 DIAGNOSIS — Z0001 Encounter for general adult medical examination with abnormal findings: Secondary | ICD-10-CM | POA: Diagnosis not present

## 2021-05-01 DIAGNOSIS — E559 Vitamin D deficiency, unspecified: Secondary | ICD-10-CM

## 2021-05-01 DIAGNOSIS — Z136 Encounter for screening for cardiovascular disorders: Secondary | ICD-10-CM

## 2021-05-01 DIAGNOSIS — D649 Anemia, unspecified: Secondary | ICD-10-CM | POA: Diagnosis not present

## 2021-05-01 DIAGNOSIS — M159 Polyosteoarthritis, unspecified: Secondary | ICD-10-CM

## 2021-05-01 DIAGNOSIS — R7309 Other abnormal glucose: Secondary | ICD-10-CM | POA: Diagnosis not present

## 2021-05-01 DIAGNOSIS — E538 Deficiency of other specified B group vitamins: Secondary | ICD-10-CM

## 2021-05-01 DIAGNOSIS — Z0000 Encounter for general adult medical examination without abnormal findings: Secondary | ICD-10-CM

## 2021-05-01 DIAGNOSIS — Z79899 Other long term (current) drug therapy: Secondary | ICD-10-CM | POA: Diagnosis not present

## 2021-05-01 DIAGNOSIS — Z5320 Procedure and treatment not carried out because of patient's decision for unspecified reasons: Secondary | ICD-10-CM

## 2021-05-01 DIAGNOSIS — E782 Mixed hyperlipidemia: Secondary | ICD-10-CM

## 2021-05-01 DIAGNOSIS — M150 Primary generalized (osteo)arthritis: Secondary | ICD-10-CM

## 2021-05-01 DIAGNOSIS — I10 Essential (primary) hypertension: Secondary | ICD-10-CM

## 2021-05-01 DIAGNOSIS — M810 Age-related osteoporosis without current pathological fracture: Secondary | ICD-10-CM

## 2021-05-01 DIAGNOSIS — Z1389 Encounter for screening for other disorder: Secondary | ICD-10-CM

## 2021-05-01 DIAGNOSIS — Z6823 Body mass index (BMI) 23.0-23.9, adult: Secondary | ICD-10-CM

## 2021-05-01 MED ORDER — ROSUVASTATIN CALCIUM 5 MG PO TABS: ORAL_TABLET | ORAL | 3 refills | Status: DC

## 2021-05-01 MED ORDER — DICLOFENAC SODIUM 1 % EX GEL: 4.0000 g | Freq: Four times a day (QID) | CUTANEOUS | 3 refills | Status: AC

## 2021-05-01 NOTE — Patient Instructions (Addendum)
°  Ann [name] , Thank you for taking time to come for your Annual Wellness Visit. I appreciate your ongoing commitment to your health goals. Please review the following plan we discussed and let me know if I can assist you in the future.   These are the goals we discussed:  Goals      DIET - INCREASE WATER INTAKE     65+ fluid ounces (4-5 bottles of water)     Exercise 150 min/wk Moderate Activity     Focus on weight bearing exercises for bone health - squats, jumping, etc.      HEMOGLOBIN A1C < 5.7     LDL CALC < 100        This is a list of the screening recommended for you and due dates:  Health Maintenance  Topic Date Due   COVID-19 Vaccine (3 - Pfizer risk series) 05/17/2021*   Tetanus Vaccine  10/24/2026   Pneumonia Vaccine  Completed   Flu Shot  Completed   DEXA scan (bone density measurement)  Completed   HPV Vaccine  Aged Out   Zoster (Shingles) Vaccine  Discontinued  *Topic was postponed. The date shown is not the original due date.     Know what a healthy weight is for you (roughly BMI <25) and aim to maintain this  Aim for 7+ servings of fruits and vegetables daily  65-80+ fluid ounces of water or unsweet tea for healthy kidneys  Limit to max 1 drink of alcohol per day; avoid smoking/tobacco  Limit animal fats in diet for cholesterol and heart health - choose grass fed whenever available  Avoid highly processed foods, and foods high in saturated/trans fats  Aim for low stress - take time to unwind and care for your mental health  Aim for 150 min of moderate intensity exercise weekly for heart health, and weights twice weekly for bone health  Aim for 7-9 hours of sleep daily     A great goal to work towards is aiming to get in a serving daily of some of the most nutritionally dense foods - G- BOMBS daily

## 2021-05-02 ENCOUNTER — Other Ambulatory Visit: Payer: Self-pay | Admitting: Adult Health

## 2021-05-02 DIAGNOSIS — E782 Mixed hyperlipidemia: Secondary | ICD-10-CM

## 2021-05-02 LAB — HEMOGLOBIN A1C
Hgb A1c MFr Bld: 5.8 % of total Hgb — ABNORMAL HIGH (ref <5.7)
Mean Plasma Glucose: 120 mg/dL
eAG (mmol/L): 6.6 mmol/L

## 2021-05-02 LAB — VITAMIN B12: Vitamin B-12: >2000 pg/mL — ABNORMAL HIGH (ref 200–1100)

## 2021-05-02 LAB — COMPLETE METABOLIC PANEL WITH GFR
AG Ratio: 1.7 (calc) (ref 1.0–2.5)
ALT: 9 U/L (ref 6–29)
AST: 18 U/L (ref 10–35)
Albumin: 4.7 g/dL (ref 3.6–5.1)
Alkaline phosphatase (APISO): 74 U/L (ref 37–153)
BUN/Creatinine Ratio: 23 (calc) — ABNORMAL HIGH (ref 6–22)
BUN: 27 mg/dL — ABNORMAL HIGH (ref 7–25)
CO2: 26 mmol/L (ref 20–32)
Calcium: 10.1 mg/dL (ref 8.6–10.4)
Chloride: 104 mmol/L (ref 98–110)
Creat: 1.15 mg/dL — ABNORMAL HIGH (ref 0.60–0.95)
Globulin: 2.7 g/dL (calc) (ref 1.9–3.7)
Glucose, Bld: 99 mg/dL (ref 65–99)
Potassium: 5.5 mmol/L — ABNORMAL HIGH (ref 3.5–5.3)
Sodium: 141 mmol/L (ref 135–146)
Total Bilirubin: 0.5 mg/dL (ref 0.2–1.2)
Total Protein: 7.4 g/dL (ref 6.1–8.1)
eGFR: 46 mL/min/{1.73_m2} — ABNORMAL LOW (ref >=60)

## 2021-05-02 LAB — IRON,TIBC AND FERRITIN PANEL
%SAT: 18 % (calc) (ref 16–45)
Ferritin: 116 ng/mL (ref 16–288)
Iron: 61 ug/dL (ref 45–160)
TIBC: 339 mcg/dL (calc) (ref 250–450)

## 2021-05-02 LAB — URINALYSIS, ROUTINE W REFLEX MICROSCOPIC
Bilirubin Urine: NEGATIVE
Glucose, UA: NEGATIVE
Hgb urine dipstick: NEGATIVE
Nitrite: NEGATIVE
Protein, ur: NEGATIVE
Specific Gravity, Urine: 1.021 (ref 1.001–1.035)
pH: <=5 (ref 5.0–8.0)

## 2021-05-02 LAB — CBC WITH DIFFERENTIAL/PLATELET
Absolute Monocytes: 449 cells/uL (ref 200–950)
Basophils Absolute: 33 cells/uL (ref 0–200)
Basophils Relative: 0.5 %
Eosinophils Absolute: 40 cells/uL (ref 15–500)
Eosinophils Relative: 0.6 %
HCT: 36.6 % (ref 35.0–45.0)
Hemoglobin: 11.5 g/dL — ABNORMAL LOW (ref 11.7–15.5)
Lymphs Abs: 1307 cells/uL (ref 850–3900)
MCH: 27.3 pg (ref 27.0–33.0)
MCHC: 31.4 g/dL — ABNORMAL LOW (ref 32.0–36.0)
MCV: 86.9 fL (ref 80.0–100.0)
MPV: 12.3 fL (ref 7.5–12.5)
Monocytes Relative: 6.8 %
Neutro Abs: 4772 cells/uL (ref 1500–7800)
Neutrophils Relative %: 72.3 %
Platelets: 208 10*3/uL (ref 140–400)
RBC: 4.21 10*6/uL (ref 3.80–5.10)
RDW: 13.1 % (ref 11.0–15.0)
Total Lymphocyte: 19.8 %
WBC: 6.6 10*3/uL (ref 3.8–10.8)

## 2021-05-02 LAB — LIPID PANEL
Cholesterol: 202 mg/dL — ABNORMAL HIGH (ref <200)
HDL: 63 mg/dL (ref >=50)
LDL Cholesterol (Calc): 114 mg/dL (calc) — ABNORMAL HIGH
Non-HDL Cholesterol (Calc): 139 mg/dL (calc) — ABNORMAL HIGH (ref <130)
Total CHOL/HDL Ratio: 3.2 (calc) (ref <5.0)
Triglycerides: 130 mg/dL (ref <150)

## 2021-05-02 LAB — MICROALBUMIN / CREATININE URINE RATIO
Creatinine, Urine: 192 mg/dL (ref 20–275)
Microalb Creat Ratio: 16 mcg/mg creat (ref <30)
Microalb, Ur: 3 mg/dL

## 2021-05-02 LAB — MICROSCOPIC MESSAGE

## 2021-05-02 LAB — VITAMIN D 25 HYDROXY (VIT D DEFICIENCY, FRACTURES): Vit D, 25-Hydroxy: 58 ng/mL (ref 30–100)

## 2021-05-02 LAB — TSH: TSH: 2.19 mIU/L (ref 0.40–4.50)

## 2021-05-02 LAB — MAGNESIUM: Magnesium: 2.1 mg/dL (ref 1.5–2.5)

## 2021-05-02 MED ORDER — ROSUVASTATIN CALCIUM 10 MG PO TABS: ORAL_TABLET | ORAL | 3 refills | Status: DC

## 2021-05-09 DIAGNOSIS — H43813 Vitreous degeneration, bilateral: Secondary | ICD-10-CM | POA: Diagnosis not present

## 2021-05-09 DIAGNOSIS — H04123 Dry eye syndrome of bilateral lacrimal glands: Secondary | ICD-10-CM | POA: Diagnosis not present

## 2021-05-09 DIAGNOSIS — H353132 Nonexudative age-related macular degeneration, bilateral, intermediate dry stage: Secondary | ICD-10-CM | POA: Diagnosis not present

## 2021-05-09 DIAGNOSIS — H35033 Hypertensive retinopathy, bilateral: Secondary | ICD-10-CM | POA: Diagnosis not present

## 2021-05-29 ENCOUNTER — Other Ambulatory Visit: Payer: Self-pay

## 2021-05-29 ENCOUNTER — Ambulatory Visit (INDEPENDENT_AMBULATORY_CARE_PROVIDER_SITE_OTHER): Payer: PPO | Admitting: Adult Health

## 2021-05-29 ENCOUNTER — Encounter: Payer: Self-pay | Admitting: Adult Health

## 2021-05-29 VITALS — BP 112/62 | HR 64 | Temp 97.9°F | Wt 123.0 lb

## 2021-05-29 DIAGNOSIS — E875 Hyperkalemia: Secondary | ICD-10-CM | POA: Diagnosis not present

## 2021-05-29 DIAGNOSIS — I10 Essential (primary) hypertension: Secondary | ICD-10-CM | POA: Diagnosis not present

## 2021-05-29 MED ORDER — OLMESARTAN MEDOXOMIL 5 MG PO TABS: ORAL_TABLET | ORAL | 1 refills | Status: DC

## 2021-05-29 MED ORDER — HYDROCHLOROTHIAZIDE 25 MG PO TABS: ORAL_TABLET | ORAL | 1 refills | Status: DC

## 2021-05-29 MED ORDER — AMLODIPINE BESYLATE 2.5 MG PO TABS: ORAL_TABLET | ORAL | 3 refills | Status: DC

## 2021-05-29 NOTE — Progress Notes (Signed)
Assessment and Plan: ? ?Ann [name] was seen today for follow-up. ? ?Diagnoses and all orders for this visit: ? ?Essential hypertension ?Hyperkalemia ?Recheck potassium after ARB dose reduction, if remains on high side will stop ARB and continue amlodipine 5 mg and increase HCTZ to daily if needed for BP control <140/80.  ?-     BASIC METABOLIC PANEL WITH GFR ? ?Further disposition pending results of labs. Discussed med's effects and SE's.   ?Over 15 minutes of exam, counseling, chart review, and critical decision making was performed.  ? ?Future Appointments  ?Date Time Provider Department Center  ?10/02/2021 10:30 AM Judd Gaudier, NP [facility] None  ?05/05/2022 11:00 AM Lucky Cowboy, MD [facility] None  ? ? ?------------------------------------------------------------------------------------------------------------------ ? ? ?HPI ?BP 112/62   Pulse 64   Temp 97.9 ?F (36.6 ?C)   Wt 123 lb (55.8 kg)   SpO2 99%   BMI 22.86 kg/m?  ?88 y.o.female presents for follow up on BP and hyperkalemia.  ? ? ?She has had mild potassium elevated up to 5.5 last visit, hx of hyperkalemia with ACEi, was on olmesartan 20 mg, reduced to 5 mg last visit. She also takes amlodipine 5 mg and HCTZ 25 mg only occasionally for fluid retention.  ? ?Her blood pressure has been controlled at home (110s/60s), today their BP is BP: 112/62 ? She denies chest pain, shortness of breath, dizziness. ? ? ?Lab Results  ?Component Value Date  ? NA 141 05/01/2021  ? K 5.5 (H) 05/01/2021  ? CL 104 05/01/2021  ? CO2 26 05/01/2021  ? GLUCOSE 99 05/01/2021  ? BUN 27 (H) 05/01/2021  ? CREATININE 1.15 (H) 05/01/2021  ? CALCIUM 10.1 05/01/2021  ? GFRAA 62 06/27/2020  ? GFRNONAA 54 (L) 06/27/2020  ? ? ? ? ?Past Medical History:  ?Diagnosis Date  ? Anemia of chronic renal failure   ? Chronic renal insufficiency   ? Degenerative joint disease   ? HTN (hypertension)   ? Hyperlipidemia   ? Shingles   ? Vitamin D deficiency   ?  ? ?Allergies  ?Allergen Reactions  ?  Ace Inhibitors   ?  Hyperkalemia ?  ? ? ?Current Outpatient Medications on File Prior to Visit  ?Medication Sig  ? amLODipine (NORVASC) 5 MG tablet Take 1 tablet 2 x /day ofr  BP (Patient taking differently: Takes 1 tablet in the morning and 1/4 at night)  ? aspirin 81 MG tablet Take 81 mg by mouth daily.  ? Cholecalciferol (VITAMIN D3) 50 MCG (2000 UT) TABS Taking 6000 units daily  ? Cyanocobalamin (VITAMIN B-12 PO) Take 1 tablet by mouth 3 (three) times a week.  ? diclofenac Sodium (VOLTAREN) 1 % GEL Apply 4 g topically 4 (four) times daily.  ? fish oil-omega-3 fatty acids 1000 MG capsule Take 2 g by mouth daily. 1400 mg  ? hydrochlorothiazide (HYDRODIURIL) 25 MG tablet Take 1 tablet Daily for Fluid Retention (Patient taking differently: Take 1 tablet Daily for Fluid Retention as needed)  ? olmesartan (BENICAR) 20 MG tablet Take  1 tablet  Daily for BP  ? OVER THE COUNTER MEDICATION Takes vitamin for her eyes 1 daily  ? rosuvastatin (CRESTOR) 10 MG tablet Take  1 tablet  Daily  for Cholesterol.  ? CINNAMON PO Take 2,000 mg by mouth daily. (Patient not taking: Reported on 05/01/2021)  ? ?No current facility-administered medications on file prior to visit.  ? ? ?ROS: all negative except above.  ? ?Physical Exam: ? ?BP 112/62  Pulse 64   Temp 97.9 ?F (36.6 ?C)   Wt 123 lb (55.8 kg)   SpO2 99%   BMI 22.86 kg/m?  ? ?General Appearance: Well nourished, in no apparent distress. ?Eyes: PERRLA, EOMs, conjunctiva no swelling or erythema ?Sinuses: No Frontal/maxillary tenderness ?ENT/Mouth: Ext aud canals clear, TMs without erythema, bulging. No erythema, swelling, or exudate on post pharynx.  Tonsils not swollen or erythematous. Hearing normal.  ?Neck: Supple, thyroid normal.  ?Respiratory: Respiratory effort normal, BS equal bilaterally without rales, rhonchi, wheezing or stridor.  ?Cardio: RRR with no MRGs. Brisk peripheral pulses without edema.  ?Abdomen: Soft, + BS.  Non tender, no guarding, rebound, hernias,  masses. ?Lymphatics: Non tender without lymphadenopathy.  ?Musculoskeletal: Full ROM, 5/5 strength, normal gait.  ?Skin: Warm, dry without rashes, lesions, ecchymosis.  ?Neuro: Cranial nerves intact. Normal muscle tone, no cerebellar symptoms. Sensation intact.  ?Psych: Awake and oriented X 3, normal affect, Insight and Judgment appropriate.  ?  ? ?Dan Maker, NP ?10:59 AM ?[facility] ? ?

## 2021-05-30 ENCOUNTER — Other Ambulatory Visit: Payer: Self-pay | Admitting: Adult Health

## 2021-05-30 DIAGNOSIS — H353132 Nonexudative age-related macular degeneration, bilateral, intermediate dry stage: Secondary | ICD-10-CM | POA: Diagnosis not present

## 2021-05-30 LAB — BASIC METABOLIC PANEL WITH GFR
BUN/Creatinine Ratio: 18 (calc) (ref 6–22)
BUN: 22 mg/dL (ref 7–25)
CO2: 26 mmol/L (ref 20–32)
Calcium: 10.3 mg/dL (ref 8.6–10.4)
Chloride: 103 mmol/L (ref 98–110)
Creat: 1.19 mg/dL — ABNORMAL HIGH (ref 0.60–0.95)
Glucose, Bld: 102 mg/dL — ABNORMAL HIGH (ref 65–99)
Potassium: 6 mmol/L — ABNORMAL HIGH (ref 3.5–5.3)
Sodium: 140 mmol/L (ref 135–146)
eGFR: 44 mL/min/{1.73_m2} — ABNORMAL LOW (ref >=60)

## 2021-05-30 MED ORDER — HYDROCHLOROTHIAZIDE 25 MG PO TABS: ORAL_TABLET | ORAL | 1 refills | Status: DC

## 2021-05-30 MED ORDER — AMLODIPINE BESYLATE 5 MG PO TABS: ORAL_TABLET | ORAL | 3 refills | Status: DC

## 2021-06-04 DIAGNOSIS — E875 Hyperkalemia: Secondary | ICD-10-CM | POA: Insufficient documentation

## 2021-06-04 NOTE — Progress Notes (Signed)
Assessment and Plan: ? ?Janele was seen today for follow-up. ? ?Diagnoses and all orders for this visit: ? ?Essential hypertension ?Hyperkalemia ?Recheck potassium after ARB cessation ?Now on amlodipine and HCTZ ?Denies potassium supplement  ?-     BASIC METABOLIC PANEL WITH GFR ? ?Further disposition pending results of labs. Discussed med's effects and SE's.   ?Over 15 minutes of exam, counseling, chart review, and critical decision making was performed.  ? ?Future Appointments  ?Date Time Provider Department Center  ?10/02/2021 10:30 AM Judd Gaudier, NP [facility] None  ?05/05/2022 11:00 AM Lucky Cowboy, MD [facility] None  ? ? ?------------------------------------------------------------------------------------------------------------------ ? ? ?HPI ?BP 136/70   Pulse 71   Temp (!) 97.3 ?F (36.3 ?C)   Wt 123 lb (55.8 kg)   SpO2 99%   BMI 22.86 kg/m?  ?88 y.o.female presents for follow up on BP and hyperkalemia.  ? ?She has had mild potassium elevated up to 5.5 on 05/01/2021, up to 6 on 05/29/2021 despite reduction in olmesartan, increased fluid intake, denied any supplement. ?  ?Olmesartan was d/c'd, advised amlodipine 5 mg daily and start HCTZ 25 mg daily rather than PRN fluid retention. Hx of hyperkalemia with ACEi though recently had been on olmesartan for over 1 year without clinically significant hyperkalemia.  ? ?Her blood pressure has been controlled at home (110s/60s), today their BP is BP: 136/70 ? She denies chest pain, shortness of breath, dizziness. ? ? ?Lab Results  ?Component Value Date  ? NA 140 05/29/2021  ? K 6.0 (H) 05/29/2021  ? CL 103 05/29/2021  ? CO2 26 05/29/2021  ? GLUCOSE 102 (H) 05/29/2021  ? BUN 22 05/29/2021  ? CREATININE 1.19 (H) 05/29/2021  ? CALCIUM 10.3 05/29/2021  ? GFRAA 62 06/27/2020  ? GFRNONAA 54 (L) 06/27/2020  ? ? ? ? ?Past Medical History:  ?Diagnosis Date  ? Anemia of chronic renal failure   ? Chronic renal insufficiency   ? Degenerative joint disease   ? HTN  (hypertension)   ? Hyperlipidemia   ? Shingles   ? Vitamin D deficiency   ?  ? ?Allergies  ?Allergen Reactions  ? Ace Inhibitors   ?  Hyperkalemia ?  ? ? ?Current Outpatient Medications on File Prior to Visit  ?Medication Sig  ? amLODipine (NORVASC) 5 MG tablet Take 1 tablet daily in the morning for blood pressure.  ? aspirin 81 MG tablet Take 81 mg by mouth daily.  ? Cholecalciferol (VITAMIN D3) 50 MCG (2000 UT) TABS Taking 6000 units daily  ? Cyanocobalamin (VITAMIN B-12 PO) Take 1 tablet by mouth 3 (three) times a week.  ? diclofenac Sodium (VOLTAREN) 1 % GEL Apply 4 g topically 4 (four) times daily.  ? fish oil-omega-3 fatty acids 1000 MG capsule Take 2 g by mouth daily. 1400 mg  ? hydrochlorothiazide (HYDRODIURIL) 25 MG tablet Take 1 tablet Daily for blood pressure and fluid retention.  ? OVER THE COUNTER MEDICATION Takes vitamin for her eyes 1 daily  ? rosuvastatin (CRESTOR) 10 MG tablet Take  1 tablet  Daily  for Cholesterol.  ? ?No current facility-administered medications on file prior to visit.  ? ? ?ROS: all negative except above.  ? ?Physical Exam: ? ?BP 136/70   Pulse 71   Temp (!) 97.3 ?F (36.3 ?C)   Wt 123 lb (55.8 kg)   SpO2 99%   BMI 22.86 kg/m?  ? ?General Appearance: Well nourished, in no apparent distress. ?Eyes: PERRLA, conjunctiva no swelling or erythema ?ENT/Mouth: mask  in place; Hearing normal.  ?Neck: Supple, thyroid normal.  ?Respiratory: Respiratory effort normal, BS equal bilaterally without rales, rhonchi, wheezing or stridor.  ?Cardio: RRR with no MRGs. Brisk peripheral pulses without edema.  ?Abdomen: Soft, + BS.  Non tender, no guarding, rebound, hernias, masses. ?Lymphatics: Non tender without lymphadenopathy.  ?Musculoskeletal: no obvious deformity; normal gait.  ?Skin: Warm, dry without rashes, lesions, ecchymosis.  ?Neuro: Normal muscle tone ?Psych: Awake and oriented X 3, normal affect, Insight and Judgment appropriate.  ? ? ?Dan Maker, NP ?11:20 AM ?[facility] ? ?

## 2021-06-05 ENCOUNTER — Encounter: Payer: Self-pay | Admitting: Adult Health

## 2021-06-05 ENCOUNTER — Ambulatory Visit (INDEPENDENT_AMBULATORY_CARE_PROVIDER_SITE_OTHER): Payer: PPO | Admitting: Adult Health

## 2021-06-05 ENCOUNTER — Other Ambulatory Visit: Payer: Self-pay

## 2021-06-05 VITALS — BP 136/70 | HR 71 | Temp 97.3°F | Wt 123.0 lb

## 2021-06-05 DIAGNOSIS — E875 Hyperkalemia: Secondary | ICD-10-CM | POA: Diagnosis not present

## 2021-06-05 DIAGNOSIS — I10 Essential (primary) hypertension: Secondary | ICD-10-CM

## 2021-06-05 DIAGNOSIS — N1831 Chronic kidney disease, stage 3a: Secondary | ICD-10-CM | POA: Diagnosis not present

## 2021-06-06 ENCOUNTER — Encounter: Payer: Self-pay | Admitting: Adult Health

## 2021-06-06 LAB — BASIC METABOLIC PANEL WITH GFR
BUN/Creatinine Ratio: 25 (calc) — ABNORMAL HIGH (ref 6–22)
BUN: 29 mg/dL — ABNORMAL HIGH (ref 7–25)
CO2: 25 mmol/L (ref 20–32)
Calcium: 10.2 mg/dL (ref 8.6–10.4)
Chloride: 102 mmol/L (ref 98–110)
Creat: 1.14 mg/dL — ABNORMAL HIGH (ref 0.60–0.95)
Glucose, Bld: 105 mg/dL — ABNORMAL HIGH (ref 65–99)
Potassium: 5.7 mmol/L — ABNORMAL HIGH (ref 3.5–5.3)
Sodium: 140 mmol/L (ref 135–146)
eGFR: 46 mL/min/{1.73_m2} — ABNORMAL LOW (ref >=60)

## 2021-06-06 NOTE — Progress Notes (Signed)
Pt is aware of lab results and recommendations, does not have any questions for provider or nurse

## 2021-06-27 ENCOUNTER — Ambulatory Visit: Payer: PPO | Admitting: Nurse Practitioner

## 2021-06-29 DIAGNOSIS — H353132 Nonexudative age-related macular degeneration, bilateral, intermediate dry stage: Secondary | ICD-10-CM | POA: Diagnosis not present

## 2021-07-29 DIAGNOSIS — H353132 Nonexudative age-related macular degeneration, bilateral, intermediate dry stage: Secondary | ICD-10-CM | POA: Diagnosis not present

## 2021-08-14 ENCOUNTER — Other Ambulatory Visit: Payer: Self-pay | Admitting: Internal Medicine

## 2021-08-14 MED ORDER — HYDROCHLOROTHIAZIDE 25 MG PO TABS: ORAL_TABLET | ORAL | 3 refills | Status: DC

## 2021-08-28 DIAGNOSIS — H353132 Nonexudative age-related macular degeneration, bilateral, intermediate dry stage: Secondary | ICD-10-CM | POA: Diagnosis not present

## 2021-09-27 DIAGNOSIS — H353132 Nonexudative age-related macular degeneration, bilateral, intermediate dry stage: Secondary | ICD-10-CM | POA: Diagnosis not present

## 2021-09-30 NOTE — Progress Notes (Unsigned)
ANNUAL WELLNESS VISIT AND FOLLOW UP   Assessment:   Annual Medicare Wellness Visit Due annually  Health maintenance reviewed  Discussed mammogram last in 2021, declines further. *** She declines DEXA follow up   Essential hypertension Well controlled with current medications Monitor blood pressure at home; call if consistently over 130/80 Continue DASH diet.   Reminder to go to the ER if any CP, SOB, nausea, dizziness, severe HA, changes vision/speech, left arm numbness and tingling and jaw pain.  Osteoporosis, unspecified osteoporosis type, unspecified pathological fracture presence Monitor Declines DEXA today ***, wouldn't do bisphosphonates or other medication treatments Discussed high calcium diet, vitamin D, weight bearing   CKD Stage III  (GFR 30-59 ml/min) Increase fluids, avoid NSAIDS, monitor sugars, will monitor closely She prefers to avoid nephrology referral unless absolutely necessary   Anemia r/t CKD III Monitor CBC  Hyperlipidemia, unspecified hyperlipidemia type Titrate statin for LDL goal <100 Discussed dietary and exercise modifications Low fat diet  Prediabetes / abnormal glucose Discussed disease and risks Discussed diet/exercise, weight management  A1C q14m Vitamin D deficiency Continue supplement Check level   Primary osteoarthritis involving multiple joints Tylenol, avoid oral NSAIDs Continue Voltaren gel QID as needed refill sent today. Doing well with this.  Medication management Monitor   No orders of the defined types were placed in this encounter.    Further disposition pending results if labs check today. Discussed med's effects and SE's.   Over 30 minutes of face to face interview, exam, counseling, chart review, and critical decision making was performed.     Plan:   During the course of the visit the patient was educated and counseled about appropriate screening and preventive services including:   Pneumococcal  vaccine  Influenza vaccine Td vaccine Prevnar 13 Screening electrocardiogram Screening mammography Bone densitometry screening Colorectal cancer screening Diabetes screening Glaucoma screening Nutrition counseling  Advanced directives: given info/requested copies   Subjective:   Ann BATLEYis a 88y.o. pleasant female who presents for AWV and follow up. She has Essential hypertension; Hyperlipidemia, mixed; Vitamin D deficiency; Degenerative joint disease; Anemia of chronic renal failure, stage 3 (moderate) ([facility]; Medication management; Other abnormal glucose (prediabetes); Osteoporosis; Vitamin B12 deficiency; CKD (chronic kidney disease) stage 3, GFR 30-59 ml/min ([facility]; Urinary incontinence; and Hyperkalemia on their problem list.   Reports she is taking tylenol intermittently for her bilateral knees and R shoulder arthritis. She also uses diclofenac gel as needed.   She does not have any health or medication concerns today.  BMI is There is no height or weight on file to calculate BMI., she has been working on diet and exercise, rides stationary bike 20 min most days. She is very active and works in her yard and garden. She has switched to [employer] killer bread.  Wt Readings from Last 3 Encounters:  06/05/21 123 lb (55.8 kg)  05/29/21 123 lb (55.8 kg)  05/01/21 120 lb 9.6 oz (54.7 kg)   Her blood pressure has been controlled at home, today their BP is     doing well with this. She does workout. She denies chest pain, shortness of breath, dizziness.    She is on cholesterol medication (Rosuvastatin 511mdaily) and denies myalgias. Her cholesterol is not at goal. Both parents ? Had CVA later in life, she wishes to continue on ASA. The cholesterol last visit was:   Lab Results  Component Value Date   CHOL 202 (H) 05/01/2021   HDL 63 05/01/2021   LDTrail  114 (H) 05/01/2021   TRIG 130 05/01/2021   CHOLHDL 3.2 05/01/2021    She has been working on diet and exercise for  prediabetes, and denies increased appetite, nausea, paresthesia of the feet, polydipsia, polyuria and visual disturbances. Last A1C in the office was:  Lab Results  Component Value Date   HGBA1C 5.8 (H) 05/01/2021    She has CKD IIIa monitored at this office:  Lab Results  Component Value Date   EGFR 46 (L) 06/05/2021   EGFR 44 (L) 05/29/2021   EGFR 46 (L) 05/01/2021   She has chronic mild anemia, of chronic disease, stable:     Latest Ref Rng & Units 05/01/2021   11:12 AM 10/08/2020   10:29 AM 06/27/2020   11:05 AM  CBC  WBC 3.8 - 10.8 Thousand/uL 6.6  5.3  5.6   Hemoglobin 11.7 - 15.5 g/dL 11.5  11.2  10.8   Hematocrit 35.0 - 45.0 % 36.6  35.1  34.0   Platelets 140 - 400 Thousand/uL 208  213  199    Has had hyperkalemia, persistent despite cessation of olmesartan *** Lab Results  Component Value Date   NA 140 06/05/2021   K 5.7 (H) 06/05/2021   CL 102 06/05/2021   CO2 25 06/05/2021   GLUCOSE 105 (H) 06/05/2021   BUN 29 (H) 06/05/2021   CREATININE 1.14 (H) 06/05/2021   CALCIUM 10.2 06/05/2021   GFRAA 62 06/27/2020   GFRNONAA 54 (L) 06/27/2020   She takes B12 SL, unsure of dose.  Lab Results  Component Value Date   VITAMINB12 >2,000 (H) 05/01/2021   Patient is on Vitamin D supplement.   Lab Results  Component Value Date   VD25OH 58 05/01/2021        Medication Review Current Outpatient Medications on File Prior to Visit  Medication Sig Dispense Refill   amLODipine (NORVASC) 5 MG tablet Take 1 tablet daily in the morning for blood pressure. 90 tablet 3   aspirin 81 MG tablet Take 81 mg by mouth daily.     Cholecalciferol (VITAMIN D3) 50 MCG (2000 UT) TABS Taking 6000 units daily 30 tablet    Cyanocobalamin (VITAMIN B-12 PO) Take 1 tablet by mouth 3 (three) times a week.     diclofenac Sodium (VOLTAREN) 1 % GEL Apply 4 g topically 4 (four) times daily. 100 g 3   fish oil-omega-3 fatty acids 1000 MG capsule Take 2 g by mouth daily. 1400 mg     hydrochlorothiazide  (HYDRODIURIL) 25 MG tablet Take 1 tablet Daily for blood pressure and fluid retention. 90 tablet 3   OVER THE COUNTER MEDICATION Takes vitamin for her eyes 1 daily     rosuvastatin (CRESTOR) 10 MG tablet Take  1 tablet  Daily  for Cholesterol. 90 tablet 3   No current facility-administered medications on file prior to visit.    Current Problems (verified) Patient Active Problem List   Diagnosis Date Noted   Hyperkalemia 06/04/2021   Urinary incontinence 05/23/2019   CKD (chronic kidney disease) stage 3, GFR 30-59 ml/min ([place]) 05/20/2019   Vitamin B12 deficiency 12/06/2017   Osteoporosis 10/15/2015   Other abnormal glucose (prediabetes) 01/01/2014   Medication management 06/27/2013   Essential hypertension    Hyperlipidemia, mixed    Vitamin D deficiency    Degenerative joint disease    Anemia of chronic renal failure, stage 3 (moderate) ([facility])     Screening Tests Immunization History  Administered Date(s) Administered   Fluad Quad(high Dose  65+) 01/19/2021   Influenza Split 01/17/2013   Influenza, High Dose Seasonal PF 01/02/2014, 01/16/2015, 01/02/2016, 01/19/2018, 01/05/2019, 01/10/2020   PFIZER(Purple Top)SARS-COV-2 Vaccination 06/11/2019, 07/02/2019   Pneumococcal Conjugate-13 06/08/2015   Pneumococcal Polysaccharide-23 06/04/2010   Pneumococcal-Unspecified 01/31/1999   Td 04/01/1995, 12/05/2005, 10/23/2016   Health Maintenance  Topic Date Due   COVID-19 Vaccine (3 - Pfizer risk series) 07/30/2019   INFLUENZA VACCINE  10/29/2021   TETANUS/TDAP  10/24/2026   Pneumonia Vaccine 24+ Years old  Completed   DEXA SCAN  Completed   HPV VACCINES  Aged Out   Zoster Vaccines- Shingrix  Discontinued    Last colonoscopy:  2006 declines another due to age Last mammogram: 10/12/2019, declines further mammograms at this time, would decline treatments  DEXA: 09/2015  Osteoporosis - declines further, would not take any treatments  Names of Other Physician/Practitioners you  currently use: 1. [facility]- here for primary care 2. Dr. Herbert Deaner, eye doctor, last visit last visit 2022 has scheduled next week  3. Dr. Gloriann Loan, dentist, last visit 2022, goes q39m Patient Care Team: MUnk Pinto MD as PCP - General (Internal Medicine)  Allergies Allergies  Allergen Reactions   Ace Inhibitors     Hyperkalemia    Angiotensin Receptor Blockers     Hyperkalemia    SURGICAL HISTORY She  has a past surgical history that includes Cataract extraction (Bilateral). FAMILY HISTORY Her family history includes Breast cancer in her daughter; CVA in her father and mother; Cerebral aneurysm in her sister. SOCIAL HISTORY She  reports that she has never smoked. She has never used smokeless tobacco. She reports current alcohol use of about 1.0 standard drink of alcohol per week. She reports that she does not use drugs.   Review of Systems  Constitutional:  Negative for malaise/fatigue and weight loss.  HENT:  Negative for hearing loss and tinnitus.   Eyes:  Negative for blurred vision and double vision.  Respiratory:  Negative for cough, shortness of breath and wheezing.   Cardiovascular:  Negative for chest pain, palpitations, orthopnea, claudication and leg swelling.  Gastrointestinal:  Negative for abdominal pain, blood in stool, constipation, diarrhea, heartburn, melena, nausea and vomiting.  Genitourinary: Negative.   Musculoskeletal:  Negative for joint pain and myalgias.  Skin:  Negative for rash.  Neurological:  Negative for dizziness, tingling, sensory change, weakness and headaches.  Endo/Heme/Allergies:  Negative for polydipsia.  Psychiatric/Behavioral: Negative.    All other systems reviewed and are negative.    Objective:   There were no vitals filed for this visit.   There is no height or weight on file to calculate BMI.  General appearance: alert, no distress, WD/WN,  female HEENT: normocephalic, sclerae  anicteric, TMs pearly, nares patent, no discharge or erythema, pharynx normal Oral cavity: MMM, no lesions Neck: supple, no lymphadenopathy, no thyromegaly, no masses Heart: RRR, normal S1, S2, no murmurs Lungs: CTA bilaterally, no wheezes, rhonchi, or rales Abdomen: +bs, soft, non tender, non distended, no masses, no hepatomegaly, no splenomegaly Musculoskeletal: nontender, no swelling, no obvious deformity. She has limited ROM to L shoulder, abduction past 90 degrees is limited (per patient ongoing for multiple years, declined surgery)  Extremities: no edema, no cyanosis, no clubbing  Pulses: 2+ symmetric, upper and lower extremities, normal cap refill Neurological: alert, oriented x 3, CN2-12 intact, strength normal upper extremities and lower extremities, sensation normal throughout, DTRs 2+ throughout, no cerebellar signs, gait normal Psychiatric: normal affect, behavior normal, pleasant  Breasts:  breasts appear normal, no suspicious masses, no skin or nipple changes or axillary nodes. Gyn: defer, no concerns Rectal: defer  EKG: sinus bradycardia, [id number]  Izora Ribas, NP   09/30/2021

## 2021-10-02 ENCOUNTER — Encounter: Payer: Self-pay | Admitting: Adult Health

## 2021-10-02 ENCOUNTER — Ambulatory Visit (INDEPENDENT_AMBULATORY_CARE_PROVIDER_SITE_OTHER): Payer: PPO | Admitting: Adult Health

## 2021-10-02 VITALS — BP 133/75 | HR 78 | Temp 96.9°F | Wt 123.4 lb

## 2021-10-02 DIAGNOSIS — E559 Vitamin D deficiency, unspecified: Secondary | ICD-10-CM

## 2021-10-02 DIAGNOSIS — R7309 Other abnormal glucose: Secondary | ICD-10-CM | POA: Diagnosis not present

## 2021-10-02 DIAGNOSIS — H6123 Impacted cerumen, bilateral: Secondary | ICD-10-CM | POA: Diagnosis not present

## 2021-10-02 DIAGNOSIS — H6981 Other specified disorders of Eustachian tube, right ear: Secondary | ICD-10-CM

## 2021-10-02 DIAGNOSIS — E875 Hyperkalemia: Secondary | ICD-10-CM

## 2021-10-02 DIAGNOSIS — M150 Primary generalized (osteo)arthritis: Secondary | ICD-10-CM

## 2021-10-02 DIAGNOSIS — R6889 Other general symptoms and signs: Secondary | ICD-10-CM

## 2021-10-02 DIAGNOSIS — E538 Deficiency of other specified B group vitamins: Secondary | ICD-10-CM

## 2021-10-02 DIAGNOSIS — D631 Anemia in chronic kidney disease: Secondary | ICD-10-CM | POA: Diagnosis not present

## 2021-10-02 DIAGNOSIS — M810 Age-related osteoporosis without current pathological fracture: Secondary | ICD-10-CM

## 2021-10-02 DIAGNOSIS — I10 Essential (primary) hypertension: Secondary | ICD-10-CM

## 2021-10-02 DIAGNOSIS — H6991 Unspecified Eustachian tube disorder, right ear: Secondary | ICD-10-CM

## 2021-10-02 DIAGNOSIS — N1831 Chronic kidney disease, stage 3a: Secondary | ICD-10-CM | POA: Diagnosis not present

## 2021-10-02 DIAGNOSIS — R32 Unspecified urinary incontinence: Secondary | ICD-10-CM

## 2021-10-02 DIAGNOSIS — E782 Mixed hyperlipidemia: Secondary | ICD-10-CM | POA: Diagnosis not present

## 2021-10-02 DIAGNOSIS — M159 Polyosteoarthritis, unspecified: Secondary | ICD-10-CM

## 2021-10-02 DIAGNOSIS — Z0001 Encounter for general adult medical examination with abnormal findings: Secondary | ICD-10-CM | POA: Diagnosis not present

## 2021-10-02 DIAGNOSIS — Z6822 Body mass index (BMI) 22.0-22.9, adult: Secondary | ICD-10-CM

## 2021-10-02 DIAGNOSIS — Z0000 Encounter for general adult medical examination without abnormal findings: Secondary | ICD-10-CM

## 2021-10-02 DIAGNOSIS — Z79899 Other long term (current) drug therapy: Secondary | ICD-10-CM | POA: Diagnosis not present

## 2021-10-02 NOTE — Patient Instructions (Addendum)
Ms. Biddy , Thank you for taking time to come for your Medicare Wellness Visit. I appreciate your ongoing commitment to your health goals. Please review the following plan we discussed and let me know if I can assist you in the future.   These are the goals we discussed:  Goals      DIET - INCREASE WATER INTAKE     60+ fluid ounces daily      Exercise 150 min/wk Moderate Activity     Focus on weight bearing exercises for bone health - squats, jumping, etc.      HEMOGLOBIN A1C < 5.7     LDL CALC < 100        This is a list of the screening recommended for you and due dates:  Health Maintenance  Topic Date Due   COVID-19 Vaccine (3 - Pfizer risk series) 10/18/2021*   Flu Shot  10/29/2021   Tetanus Vaccine  10/24/2026   Pneumonia Vaccine  Completed   DEXA scan (bone density measurement)  Completed   HPV Vaccine  Aged Out   Zoster (Shingles) Vaccine  Discontinued  *Topic was postponed. The date shown is not the original due date.   Please bring notarized copies of advanced directive/living will/ do not resuscitate paper work ASAP so we can get this properly documented for you   Use a dropper or use a cap to put peroxide, olive oil,mineral oil or canola oil in the effected ear- 2-3 times a week. Let it soak for 20-30 min then you can take a shower or use a baby bulb with warm water to wash out the ear wax.  Can buy debrox wax removal kit over the counter.  Do not use Qtips     Advance Directive  Advance directives are legal documents that allow you to make decisions about your health care and medical treatment in case you become unable to communicate for yourself. Advance directives let your wishes be known to family, friends, and health care providers. Discussing and writing advance directives should happen over time rather than all at once. Advance directives can be changed and updated at any time. There are different types of advance directives, such as: Medical power  of attorney. Living will. Do not resuscitate (DNR) order or do not attempt resuscitation (DNAR) order. Health care proxy and medical power of attorney A health care proxy is also called a health care agent. This person is appointed to make medical decisions for you when you are unable to make decisions for yourself. Generally, people ask a trusted friend or family member to act as their proxy and represent their preferences. Make sure you have an agreement with your trusted person to act as your proxy. A proxy may have to make a medical decision on your behalf if your wishes are not known. A medical power of attorney, also called a durable power of attorney for health care, is a legal document that names your health care proxy. Depending on the laws in your state, the document may need to be: Signed. Notarized. Dated. Copied. Witnessed. Incorporated into your medical record. You may also want to appoint a trusted person to manage your money in the event you are unable to do so. This is called a durable power of attorney for finances. It is a separate legal document from the durable power of attorney for health care. You may choose your health care proxy or someone different to act as your agent in money matters. If  you do not appoint a proxy, or there is a concern that the proxy is not acting in your best interest, a court may appoint a guardian to act on your behalf. Living will A living will is a set of instructions that state your wishes about medical care when you cannot express them yourself. Health care providers should keep a copy of your living will in your medical record. You may want to give a copy to family members or friends. To alert caregivers in case of an emergency, you can place a card in your wallet to let them know that you have a living will and where they can find it. A living will is used if you become: Terminally ill. Disabled. Unable to communicate or make decisions. The  following decisions should be included in your living will: To use or not to use life support equipment, such as dialysis machines and breathing machines (ventilators). Whether you want a DNR or DNAR order. This tells health care providers not to use cardiopulmonary resuscitation (CPR) if breathing or heartbeat stops. To use or not to use tube feeding. To be given or not to be given food and fluids. Whether you want comfort (palliative) care when the goal becomes comfort rather than a cure. Whether you want to donate your organs and tissues. A living will does not give instructions for distributing your money and property if you should pass away. DNR or DNAR A DNR or DNAR order is a request not to have CPR in the event that your heart stops beating or you stop breathing. If a DNR or DNAR order has not been made and shared, a health care provider will try to help any patient whose heart has stopped or who has stopped breathing. If you plan to have surgery, talk with your health care provider about how your DNR or DNAR order will be followed if problems occur. What if I do not have an advance directive? Some states assign family decision makers to act on your behalf if you do not have an advance directive. Each state has its own laws about advance directives. You may want to check with your health care provider, attorney, or state representative about the laws in your state. Summary Advance directives are legal documents that allow you to make decisions about your health care and medical treatment in case you become unable to communicate for yourself. The process of discussing and writing advance directives should happen over time. You can change and update advance directives at any time. Advance directives may include a medical power of attorney, a living will, and a DNR or DNAR order. This information is not intended to replace advice given to you by your health care provider. Make sure you discuss  any questions you have with your health care provider. Document Revised: 12/20/2019 Document Reviewed: 12/20/2019 Elsevier Patient Education  [place].

## 2021-10-03 LAB — COMPLETE METABOLIC PANEL WITH GFR
AG Ratio: 1.8 (calc) (ref 1.0–2.5)
ALT: 10 U/L (ref 6–29)
AST: 18 U/L (ref 10–35)
Albumin: 4.6 g/dL (ref 3.6–5.1)
Alkaline phosphatase (APISO): 70 U/L (ref 37–153)
BUN/Creatinine Ratio: 21 (calc) (ref 6–22)
BUN: 26 mg/dL — ABNORMAL HIGH (ref 7–25)
CO2: 28 mmol/L (ref 20–32)
Calcium: 10.3 mg/dL (ref 8.6–10.4)
Chloride: 100 mmol/L (ref 98–110)
Creat: 1.24 mg/dL — ABNORMAL HIGH (ref 0.60–0.95)
Globulin: 2.6 g/dL (calc) (ref 1.9–3.7)
Glucose, Bld: 106 mg/dL — ABNORMAL HIGH (ref 65–99)
Potassium: 5.4 mmol/L — ABNORMAL HIGH (ref 3.5–5.3)
Sodium: 138 mmol/L (ref 135–146)
Total Bilirubin: 0.5 mg/dL (ref 0.2–1.2)
Total Protein: 7.2 g/dL (ref 6.1–8.1)
eGFR: 42 mL/min/{1.73_m2} — ABNORMAL LOW (ref >=60)

## 2021-10-03 LAB — MAGNESIUM: Magnesium: 2.1 mg/dL (ref 1.5–2.5)

## 2021-10-03 LAB — LIPID PANEL
Cholesterol: 173 mg/dL (ref <200)
HDL: 62 mg/dL (ref >=50)
LDL Cholesterol (Calc): 88 mg/dL (calc)
Non-HDL Cholesterol (Calc): 111 mg/dL (calc) (ref <130)
Total CHOL/HDL Ratio: 2.8 (calc) (ref <5.0)
Triglycerides: 124 mg/dL (ref <150)

## 2021-10-03 LAB — CBC WITH DIFFERENTIAL/PLATELET
Absolute Monocytes: 400 cells/uL (ref 200–950)
Basophils Absolute: 31 cells/uL (ref 0–200)
Basophils Relative: 0.6 %
Eosinophils Absolute: 68 cells/uL (ref 15–500)
Eosinophils Relative: 1.3 %
HCT: 35.7 % (ref 35.0–45.0)
Hemoglobin: 11.4 g/dL — ABNORMAL LOW (ref 11.7–15.5)
Lymphs Abs: 1274 cells/uL (ref 850–3900)
MCH: 26.9 pg — ABNORMAL LOW (ref 27.0–33.0)
MCHC: 31.9 g/dL — ABNORMAL LOW (ref 32.0–36.0)
MCV: 84.2 fL (ref 80.0–100.0)
MPV: 11.3 fL (ref 7.5–12.5)
Monocytes Relative: 7.7 %
Neutro Abs: 3427 cells/uL (ref 1500–7800)
Neutrophils Relative %: 65.9 %
Platelets: 225 10*3/uL (ref 140–400)
RBC: 4.24 10*6/uL (ref 3.80–5.10)
RDW: 13.3 % (ref 11.0–15.0)
Total Lymphocyte: 24.5 %
WBC: 5.2 10*3/uL (ref 3.8–10.8)

## 2021-10-03 LAB — VITAMIN B12: Vitamin B-12: 1791 pg/mL — ABNORMAL HIGH (ref 200–1100)

## 2021-10-03 LAB — HEMOGLOBIN A1C
Hgb A1c MFr Bld: 6 % of total Hgb — ABNORMAL HIGH (ref <5.7)
Mean Plasma Glucose: 126 mg/dL
eAG (mmol/L): 7 mmol/L

## 2021-10-03 LAB — TSH: TSH: 1.94 mIU/L (ref 0.40–4.50)

## 2021-10-27 DIAGNOSIS — H353132 Nonexudative age-related macular degeneration, bilateral, intermediate dry stage: Secondary | ICD-10-CM | POA: Diagnosis not present

## 2021-11-26 DIAGNOSIS — H353132 Nonexudative age-related macular degeneration, bilateral, intermediate dry stage: Secondary | ICD-10-CM | POA: Diagnosis not present

## 2021-12-26 DIAGNOSIS — H353132 Nonexudative age-related macular degeneration, bilateral, intermediate dry stage: Secondary | ICD-10-CM | POA: Diagnosis not present

## 2022-01-21 NOTE — Progress Notes (Signed)
Future Appointments  Date Time Provider Department  01/22/2022                      ov 10:00 AM Ann Pinto, MD [facility]  05/05/2022                         cpe 11:00 AM Ann Pinto, MD [facility]  10/03/2022                          wellness  10:00 AM Ann Rossetti, NP [facility]    History of Present Illness:       This very nice 88 y.o. DWF presents for 6 month follow up with HTN, HLD, Pre-Diabetes and Vitamin D Deficiency. Patient also has c/o multiple joint aches & pains.        Patient is treated for HTN (1997) & BP has been controlled at home. Today's BP is at goal - 110/60.   Patient has CKD3a  (GFR 49) consequent of her HTN.  Patient has had no complaints of any cardiac type chest pain, palpitations, dyspnea [name] /PND, dizziness, claudication or dependent edema.       Hyperlipidemia is controlled with diet & Crestor. Patient denies myalgias or other med SE's. Last Lipids were  at goal:  Lab Results  Component Value Date   CHOL 173 10/02/2021   HDL 62 10/02/2021   LDLCALC 88 10/02/2021   TRIG 124 10/02/2021   CHOLHDL 2.8 10/02/2021     Also, the patient has history of PreDiabetes (A1c 6.1% /2010) and has had no symptoms of reactive hypoglycemia, diabetic polys, paresthesias or visual blurring.  Last A1c was not at goal:  Lab Results  Component Value Date   HGBA1C 6.0 (H) 10/02/2021                                                     Further, the patient also has history of Vitamin D Deficiency ("25" /2008) and supplements vitamin D without any suspected side-effects. Last vitamin D was at goal:  Lab Results  Component Value Date   VD25OH 58 05/01/2021       Current Outpatient Medications  Medication Instructions   amLODipine  5 MG tablet Take 1 tablet daily     Aspirin   81 mg Daily    VITAMIN D 2000   Taking 6000 units daily   VITAMIN B-12  1 tablet 3 times weekly   diclofenac  4 g Topical,  4 times daily   fish oil-omega-3 fatty  acids   2 g 1400 mg   Daily,   Hctz  25 MG tablet Take 1 tablet Daily   vitamin for eyes  Takes 1 daily    rosuvastatin  10 MG tablet Take  1 tablet  Daily      Allergies  Allergen Reactions   Ace Inhibitors Hyperkalemia    PMHx:   Past Medical History:  Diagnosis Date   Anemia of chronic renal failure    Chronic renal insufficiency    Degenerative joint disease    HTN (hypertension)    Hyperlipidemia    Shingles    Vitamin D deficiency     Immunization History  Administered Date(s) Administered   Influenza Split  01/17/2013   Influenza, High Dose   01/19/2018, 01/05/2019, 01/10/2020   PFIZER SARS-COV-2 Vacc  06/11/2019, 07/02/2019   Pneumococcal  - 13 06/08/2015   Pneumococcal  - 23 06/04/2010   Pneumococcal  - 23 01/31/1999   Td 04/01/1995, 12/05/2005, 10/23/2016     No past surgical history on file.  FHx:    Reviewed / unchanged  SHx:    Reviewed / unchanged   Systems Review:  Constitutional: Denies fever, chills, wt changes, headaches, insomnia, fatigue, night sweats, change in appetite. Eyes: Denies redness, blurred vision, diplopia, discharge, itchy, watery eyes.  ENT: Denies discharge, congestion, post nasal drip, epistaxis, sore throat, earache, hearing loss, dental pain, tinnitus, vertigo, sinus pain, snoring.  CV: Denies chest pain, palpitations, irregular heartbeat, syncope, dyspnea, diaphoresis, orthopnea, PND, claudication or edema. Respiratory: denies cough, dyspnea, DOE, pleurisy, hoarseness, laryngitis, wheezing.  Gastrointestinal: Denies dysphagia, odynophagia, heartburn, reflux, water brash, abdominal pain or cramps, nausea, vomiting, bloating, diarrhea, constipation, hematemesis, melena, hematochezia  or hemorrhoids. Genitourinary: Denies dysuria, frequency, urgency, nocturia, hesitancy, discharge, hematuria or flank pain. Musculoskeletal: Denies arthralgias, myalgias, stiffness, jt. swelling, pain, limping or strain/sprain.  Skin: Denies  pruritus, rash, hives, warts, acne, eczema or change in skin lesion(s). Neuro: No weakness, tremor, incoordination, spasms, paresthesia or pain. Psychiatric: Denies confusion, memory loss or sensory loss. Endo: Denies change in weight, skin or hair change.  Heme/Lymph: No excessive bleeding, bruising or enlarged lymph nodes.  Physical Exam  BP 110/60   Pulse 80   Temp 97.6 F (36.4 C)   Resp 16   Ht 5' (1.524 m)   Wt 122 lb (55.3 kg)   SpO2 99%   BMI 23.83 kg/m   Appears  well nourished, well groomed  and in no distress.  Eyes: PERRLA, EOMs, conjunctiva no swelling or erythema. Sinuses: No frontal/maxillary tenderness ENT/Mouth: EAC's clear, TM's nl w/o erythema, bulging. Nares clear w/o erythema, swelling, exudates. Oropharynx clear without erythema or exudates. Oral hygiene is good. Tongue normal, non obstructing. Hearing intact.  Neck: Supple. Thyroid not palpable. Car 2+/2+ without bruits, nodes or JVD. Chest: Respirations nl with BS clear & equal w/o rales, rhonchi, wheezing or stridor.  Cor: Heart sounds normal w/ regular rate and rhythm without sig. murmurs, gallops, clicks or rubs. Peripheral pulses normal and equal  without edema.  Abdomen: Soft & bowel sounds normal. Non-tender w/o guarding, rebound, hernias, masses or organomegaly.  Lymphatics: Unremarkable.  Musculoskeletal: Full ROM all peripheral extremities, joint stability, 5/5 strength and normal gait.  Skin: Warm, dry without exposed rashes, lesions or ecchymosis apparent.  Neuro: Cranial nerves intact, reflexes equal bilaterally. Sensory-motor testing grossly intact. Tendon reflexes grossly intact.  Pysch: Alert & oriented x 3.  Insight and judgement nl & appropriate. No ideations.  Assessment and Plan:   1. Essential hypertension  - Continue medication, monitor blood pressure at home.  - Continue DASH diet.  Reminder to go to the ER if any CP,  SOB, nausea, dizziness, severe HA, changes vision/speech.     - CBC with Differential/Platelet - COMPLETE METABOLIC PANEL WITH GFR - Magnesium - TSH  2. Hyperlipidemia, mixed  - Continue diet/meds, exercise,& lifestyle modifications.  - Continue monitor periodic cholesterol/liver & renal functions    - Lipid panel - TSH  3. Abnormal glucose   - Continue diet, exercise  - Lifestyle modifications.  - Monitor appropriate labs  - Hemoglobin A1c - Insulin, random   - Hemoglobin A1c - Insulin, random  4. Vitamin D deficiency  - VITAMIN  D 25 Hydroxy   5. Stage 3a chronic kidney disease (HCC)  - COMPLETE METABOLIC PANEL WITH GFR  6. Medication management  - CBC with Differential/Platelet - COMPLETE METABOLIC PANEL WITH GFR - Magnesium - Lipid panel - TSH - Hemoglobin A1c - Insulin, random - VITAMIN D 25 Hydroxy   6. Medication management  - CBC with Differential/Platelet - COMPLETE METABOLIC PANEL WITH GFR - Magnesium - Lipid panel - TSH - Hemoglobin A1c - Insulin, random - VITAMIN D 25 Hydroxy          Discussed  regular exercise, BP monitoring, weight control to achieve/maintain BMI less than 25 and discussed med and SE's. Recommended labs to assess and monitor clinical status with further disposition pending results of labs.  I discussed the assessment and treatment plan with the patient. The patient was provided an opportunity to ask questions and all were answered. The patient agreed with the plan and demonstrated an understanding of the instructions.  I provided over 30 minutes of exam, counseling, chart review and  complex critical decision making.        The patient was advised to call back or seek an in-person evaluation if the symptoms worsen or if the condition fails to improve as anticipated.   Kirtland Bouchard, MD

## 2022-01-22 ENCOUNTER — Encounter: Payer: Self-pay | Admitting: Internal Medicine

## 2022-01-22 ENCOUNTER — Ambulatory Visit (INDEPENDENT_AMBULATORY_CARE_PROVIDER_SITE_OTHER): Payer: PPO | Admitting: Internal Medicine

## 2022-01-22 VITALS — BP 110/60 | HR 80 | Temp 97.6°F | Resp 16 | Ht 60.0 in | Wt 122.0 lb

## 2022-01-22 DIAGNOSIS — N1831 Chronic kidney disease, stage 3a: Secondary | ICD-10-CM | POA: Diagnosis not present

## 2022-01-22 DIAGNOSIS — M158 Other polyosteoarthritis: Secondary | ICD-10-CM

## 2022-01-22 DIAGNOSIS — E782 Mixed hyperlipidemia: Secondary | ICD-10-CM

## 2022-01-22 DIAGNOSIS — I10 Essential (primary) hypertension: Secondary | ICD-10-CM

## 2022-01-22 DIAGNOSIS — R7309 Other abnormal glucose: Secondary | ICD-10-CM

## 2022-01-22 DIAGNOSIS — E559 Vitamin D deficiency, unspecified: Secondary | ICD-10-CM

## 2022-01-22 DIAGNOSIS — Z79899 Other long term (current) drug therapy: Secondary | ICD-10-CM

## 2022-01-22 MED ORDER — MELOXICAM 15 MG PO TABS: ORAL_TABLET | ORAL | 3 refills | Status: DC

## 2022-01-22 NOTE — Patient Instructions (Signed)

## 2022-01-23 LAB — CBC WITH DIFFERENTIAL/PLATELET
Absolute Monocytes: 451 cells/uL (ref 200–950)
Basophils Absolute: 32 cells/uL (ref 0–200)
Basophils Relative: 0.6 %
Eosinophils Absolute: 48 cells/uL (ref 15–500)
Eosinophils Relative: 0.9 %
HCT: 34.7 % — ABNORMAL LOW (ref 35.0–45.0)
Hemoglobin: 11.3 g/dL — ABNORMAL LOW (ref 11.7–15.5)
Lymphs Abs: 1426 cells/uL (ref 850–3900)
MCH: 27.8 pg (ref 27.0–33.0)
MCHC: 32.6 g/dL (ref 32.0–36.0)
MCV: 85.5 fL (ref 80.0–100.0)
MPV: 11.8 fL (ref 7.5–12.5)
Monocytes Relative: 8.5 %
Neutro Abs: 3344 cells/uL (ref 1500–7800)
Neutrophils Relative %: 63.1 %
Platelets: 225 10*3/uL (ref 140–400)
RBC: 4.06 10*6/uL (ref 3.80–5.10)
RDW: 13.8 % (ref 11.0–15.0)
Total Lymphocyte: 26.9 %
WBC: 5.3 10*3/uL (ref 3.8–10.8)

## 2022-01-23 LAB — COMPLETE METABOLIC PANEL WITH GFR
AG Ratio: 1.7 (calc) (ref 1.0–2.5)
ALT: 9 U/L (ref 6–29)
AST: 19 U/L (ref 10–35)
Albumin: 4.7 g/dL (ref 3.6–5.1)
Alkaline phosphatase (APISO): 68 U/L (ref 37–153)
BUN/Creatinine Ratio: 23 (calc) — ABNORMAL HIGH (ref 6–22)
BUN: 26 mg/dL — ABNORMAL HIGH (ref 7–25)
CO2: 26 mmol/L (ref 20–32)
Calcium: 10.2 mg/dL (ref 8.6–10.4)
Chloride: 99 mmol/L (ref 98–110)
Creat: 1.15 mg/dL — ABNORMAL HIGH (ref 0.60–0.95)
Globulin: 2.7 g/dL (calc) (ref 1.9–3.7)
Glucose, Bld: 111 mg/dL — ABNORMAL HIGH (ref 65–99)
Potassium: 4.7 mmol/L (ref 3.5–5.3)
Sodium: 139 mmol/L (ref 135–146)
Total Bilirubin: 0.5 mg/dL (ref 0.2–1.2)
Total Protein: 7.4 g/dL (ref 6.1–8.1)
eGFR: 46 mL/min/{1.73_m2} — ABNORMAL LOW (ref >=60)

## 2022-01-23 LAB — TSH: TSH: 2.66 mIU/L (ref 0.40–4.50)

## 2022-01-23 LAB — HEMOGLOBIN A1C
Hgb A1c MFr Bld: 6.2 % of total Hgb — ABNORMAL HIGH (ref <5.7)
Mean Plasma Glucose: 131 mg/dL
eAG (mmol/L): 7.3 mmol/L

## 2022-01-23 LAB — LIPID PANEL
Cholesterol: 169 mg/dL (ref <200)
HDL: 64 mg/dL (ref >=50)
LDL Cholesterol (Calc): 85 mg/dL (calc)
Non-HDL Cholesterol (Calc): 105 mg/dL (calc) (ref <130)
Total CHOL/HDL Ratio: 2.6 (calc) (ref <5.0)
Triglycerides: 103 mg/dL (ref <150)

## 2022-01-23 LAB — MAGNESIUM: Magnesium: 2.1 mg/dL (ref 1.5–2.5)

## 2022-01-23 LAB — VITAMIN D 25 HYDROXY (VIT D DEFICIENCY, FRACTURES): Vit D, 25-Hydroxy: 51 ng/mL (ref 30–100)

## 2022-01-23 LAB — INSULIN, RANDOM: Insulin: 14.9 u[IU]/mL

## 2022-01-23 NOTE — Progress Notes (Signed)
<><><><><><><><><><><><><><><><><><><><><><><><><><><><><><><><><> <><><><><><><><><><><><><><><><><><><><><><><><><><><><><><><><><> - Test results slightly outside the reference range are not unusual. If there is anything important, I will review this with you,  otherwise it is considered normal test values.  If you have further questions,  please do not hesitate to contact me at the office or via My Chart.  <><><><><><><><><><><><><><><><><><><><><><><><><><><><><><><><><> <><><><><><><><><><><><><><><><><><><><><><><><><><><><><><><><><>  -  Mild chronic anemia is stable & OK  <><><><><><><><><><><><><><><><><><><><><><><><><><><><><><><><><>  -  Kidney Functions - are stable in Stage 3a  <><><><><><><><><><><><><><><><><><><><><><><><><><><><><><><><><>  -  A1c = 6.2% - Blood sugar and A1c are STILL elevated                                          in the borderline and early or pre-diabetes range    which has the same 300% increased risk for   heart attack, stroke, cancer and alzheimer- type vascular dementia                                                                                          as full blown diabetes.   But the good news is that diet, exercise with weight loss can                                                                        cure the early diabetes at this point.  Your blood sugar and A1c are elevated.    Being diabetic has a  300% increased risk for heart attack,  stroke, cancer, and alzheimer- type vascular dementia.   It is very important that you work harder with diet by  avoiding all foods that are white except chicken,  fish & calliflower.  - Avoid white rice  (brown & wild rice is OK),   - Avoid white potatoes  (sweet potatoes in moderation is OK),   White bread or wheat bread or anything made out of   white flour like bagels, donuts, rolls, buns, biscuits, cakes,  - pastries, cookies, pizza crust, and pasta (made from  white  flour & egg whites)   - vegetarian pasta or spinach or wheat pasta is OK.  - Multigrain breads like Arnold's, Pepperidge Farm or   multigrain sandwich thins or high fiber breads like   Eureka bread or "Dave's Killer" breads that are  4 to 5 grams fiber per slice !  are best.    Diet, exercise and weight loss can reverse and cure  diabetes in the early stages.   <><><><><><><><><><><><><><><><><><><><><><><><><><><><><><><><><> <><><><><><><><><><><><><><><><><><><><><><><><><><><><><><><><><>  -  Total Chol =169  &  LDL Chol = 85  - Both  Excellent   - Very low risk for Heart Attack  / Stroke <><><><><><><><><><><><><><><><><><><><><><><><><><><><><><><><><>  -  Vitamin D = 51 is a little Low   - Vitamin D goal is between 70-100.   -  Please make sure that you are taking your Vitamin D as directed.   - It is very important as a natural anti-inflammatory and helping the  immune system protect against viral infections, like the Covid-19    helping hair, skin, and nails, as well as reducing stroke and  heart attack risk.   - It helps your bones and helps with mood.  - It also decreases numerous cancer risks so please  take it as directed.   - Low Vit D is associated with a 200-300% higher risk for  CANCER   and 200-300% higher risk for HEART   ATTACK  &  STROKE.    - It is also associated with higher death rate at younger ages,   autoimmune diseases like Rheumatoid arthritis, Lupus,  Multiple Sclerosis.     - Also many other serious conditions, like depression, Alzheimer's  Dementia, infertility, muscle aches, fatigue, fibromyalgia  <><><><><><><><><><><><><><><><><><><><><><><><><><><><><><><><><>  - All Else - CBC - Kidneys - Electrolytes - Liver - Magnesium & Thyroid    - all  Normal / OK <><><><><><><><><><><><><><><><><><><><><><><><><><><><><><><><><> <><><><><><><><><><><><><><><><><><><><><><><><><><><><><><><><><>  -

## 2022-01-25 DIAGNOSIS — H353132 Nonexudative age-related macular degeneration, bilateral, intermediate dry stage: Secondary | ICD-10-CM | POA: Diagnosis not present

## 2022-02-24 DIAGNOSIS — H353132 Nonexudative age-related macular degeneration, bilateral, intermediate dry stage: Secondary | ICD-10-CM | POA: Diagnosis not present

## 2022-03-26 DIAGNOSIS — H353132 Nonexudative age-related macular degeneration, bilateral, intermediate dry stage: Secondary | ICD-10-CM | POA: Diagnosis not present

## 2022-04-23 NOTE — Progress Notes (Unsigned)
ANNUAL WELLNESS VISIT AND FOLLOW UP   Assessment:   Annual Medicare Wellness Visit Due annually  Health maintenance reviewed  Discussed mammogram last in 2021, declines further.  She declines DEXA follow up  Advanced directive/living will/DNR paperwork requested  Essential hypertension Well controlled with current medications Monitor blood pressure at home; call if consistently over 130/80 Continue DASH diet.   Reminder to go to the ER if any CP, SOB, nausea, dizziness, severe HA, changes vision/speech, left arm numbness and tingling and jaw pain.  Osteoporosis, unspecified osteoporosis type, unspecified pathological fracture presence Declines DEXA, wouldn't do bisphosphonates or other medication treatments, risks of falls/fractures reviewed Discussed high calcium diet, vitamin D, weight bearing   CKD Stage III  (GFR 30-59 ml/min) Increase fluids, avoid NSAIDS, monitor sugars, will monitor closely She prefers to avoid nephrology referral unless absolutely necessary   Anemia r/t CKD III(HCC) Monitor CBC  Hyperlipidemia, unspecified hyperlipidemia type Titrate statin for LDL goal <100 Discussed dietary and exercise modifications Low fat diet Lipid panel  Prediabetes / abnormal glucose Discussed disease and risks Discussed diet/exercise, weight management  A1C q40m CMP  Vitamin D deficiency Continue supplement Check level annually and PRN  Primary osteoarthritis involving multiple joints Tylenol, avoid oral NSAIDs Continue Voltaren gel QID as needed Follow up ortho PRN, states currently managing  Medication management Magnesium     No orders of the defined types were placed in this encounter.   Further disposition pending results if labs check today. Discussed med's effects and SE's.   Over 30 minutes of face to face interview, exam, counseling, chart review, and critical decision making was performed.   Future Appointments  Date Time Provider [place]  08/08/2022 10:00 AM Unk Pinto, MD [facility] None  04/14/2023 11:00 AM Alycia Rossetti, NP [facility] None     Plan:   During the course of the visit the patient was educated and counseled about appropriate screening and preventive services including:   Pneumococcal vaccine  Influenza vaccine Td vaccine Prevnar 13 Screening electrocardiogram Screening mammography Bone densitometry screening Colorectal cancer screening Diabetes screening Glaucoma screening Nutrition counseling  Advanced directives: requested copies   Subjective:   Ann [name] is a 89 y.o. pleasant female who presents for AWV and follow up. She has Essential hypertension; Hyperlipidemia, mixed; Vitamin D deficiency; Degenerative joint disease; Anemia of chronic renal failure, stage 3 (moderate) ([place]); Medication management; Other abnormal glucose (prediabetes); Osteoporosis; Vitamin B12 deficiency; CKD (chronic kidney disease) stage 3, GFR 30-59 ml/min ([place]); Urinary incontinence; and Hyperkalemia on their problem list. She is here for AWV;  She reports would not want cancer treatment and declines further screening.    Reports she is taking tylenol intermittently for her bilateral knees and L shoulder arthritis, has seen ortho. She also uses diclofenac gel as needed. She will take Tylenol as needed.  BMI is Body mass index is 23.83 kg/m., she has been working on diet and exercise, rides stationary bike 20 min intermittently, does daily stretching/strength exercises. She is very active and works in her yard and garden, currently very active with raking and Chiropractor. She has switched to [employer] bread.  Wt Readings from Last 3 Encounters:  04/24/22 122 lb (55.3 kg)  01/22/22 122 lb (55.3 kg)  10/02/21 123 lb 6.4 oz (56 kg)   Her blood pressure has been controlled at home with amlodipine 5 mg and HCTZ 25 mg, today their BP is BP: 128/70   BP Readings from Last 3 Encounters:  04/24/22  128/70  01/22/22 110/60  10/02/21 133/75  She does workout. She denies chest pain, shortness of breath, dizziness.    She is on cholesterol medication (Rosuvastatin 10 mg daily) and denies myalgias. Her cholesterol is not at goal, would like LDL < 70. Both parents ? Had CVA later in life, she wishes to continue on ASA (takes every other day). The cholesterol last visit was:   Lab Results  Component Value Date   CHOL 169 01/22/2022   HDL 64 01/22/2022   LDLCALC 85 01/22/2022   TRIG 103 01/22/2022   CHOLHDL 2.6 01/22/2022    She has been working on diet and exercise for prediabetes, and denies increased appetite, nausea, paresthesia of the feet, polydipsia, polyuria and visual disturbances. Last A1C in the office was:  Lab Results  Component Value Date   HGBA1C 6.2 (H) 01/22/2022    She tries to push fluids throughout the day. She has CKD IIIa monitored at this office:  Lab Results  Component Value Date   EGFR 46 (L) 01/22/2022   EGFR 42 (L) 10/02/2021   EGFR 46 (L) 06/05/2021   She has chronic mild anemia, of chronic disease, stable:     Latest Ref Rng & Units 01/22/2022   12:00 AM 10/02/2021   11:16 AM 05/01/2021   11:12 AM  CBC  WBC 3.8 - 10.8 Thousand/uL 5.3  5.2  6.6   Hemoglobin 11.7 - 15.5 g/dL 78.2  42.3  53.6   Hematocrit 35.0 - 45.0 % 34.7  35.7  36.6   Platelets 140 - 400 Thousand/uL 225  225  208     She takes B12 SL, unsure of dose, reports has reduced frequency Lab Results  Component Value Date   VITAMINB12 1,791 (H) 10/02/2021   Patient is on Vitamin D supplement.   Lab Results  Component Value Date   VD25OH 51 01/22/2022        Medication Review Current Outpatient Medications on File Prior to Visit  Medication Sig Dispense Refill   amLODipine (NORVASC) 5 MG tablet Take 1 tablet daily in the morning for blood pressure. 90 tablet 3   aspirin 81 MG tablet Take 81 mg by mouth daily.     Cholecalciferol (VITAMIN D3) 50 MCG (2000 UT) TABS Taking 6000 units  daily 30 tablet    Cyanocobalamin (VITAMIN B-12 PO) Take 1 tablet by mouth 3 (three) times a week.     diclofenac Sodium (VOLTAREN) 1 % GEL Apply 4 g topically 4 (four) times daily. 100 g 3   fish oil-omega-3 fatty acids 1000 MG capsule Take 2 g by mouth daily. 1400 mg     hydrochlorothiazide (HYDRODIURIL) 25 MG tablet Take 1 tablet Daily for blood pressure and fluid retention. 90 tablet 3   OVER THE COUNTER MEDICATION Takes vitamin for her eyes 1 daily     rosuvastatin (CRESTOR) 10 MG tablet Take  1 tablet  Daily  for Cholesterol. 90 tablet 3   No current facility-administered medications on file prior to visit.    Current Problems (verified) Patient Active Problem List   Diagnosis Date Noted   Hyperkalemia 06/04/2021   Urinary incontinence 05/23/2019   CKD (chronic kidney disease) stage 3, GFR 30-59 ml/min (HCC) 05/20/2019   Vitamin B12 deficiency 12/06/2017   Osteoporosis 10/15/2015   Other abnormal glucose (prediabetes) 01/01/2014   Medication management 06/27/2013   Essential hypertension    Hyperlipidemia, mixed    Vitamin D deficiency    Degenerative joint disease  Anemia of chronic renal failure, stage 3 (moderate) (HCC)     Screening Tests Immunization History  Administered Date(s) Administered   Fluad Quad(high Dose 65+) 01/19/2021   Influenza Split 01/17/2013   Influenza, High Dose Seasonal PF 01/02/2014, 01/16/2015, 01/02/2016, 01/19/2018, 01/05/2019, 01/10/2020, 01/22/2022   PFIZER(Purple Top)SARS-COV-2 Vaccination 06/11/2019, 07/02/2019   Pneumococcal Conjugate-13 06/08/2015   Pneumococcal Polysaccharide-23 06/04/2010   Pneumococcal-Unspecified 01/31/1999   Td 04/01/1995, 12/05/2005, 10/23/2016   Health Maintenance  Topic Date Due   COVID-19 Vaccine (3 - Pfizer risk series) 05/10/2022 (Originally 07/30/2019)   Medicare Annual Wellness (AWV)  04/25/2023   DTaP/Tdap/Td (4 - Tdap) 10/24/2026   Pneumonia Vaccine 107+ Years old  Completed   INFLUENZA VACCINE   Completed   DEXA SCAN  Completed   HPV VACCINES  Aged Out   Zoster Vaccines- Shingrix  Discontinued   Last mammogram: 10/12/2019, declines further mammograms at this time, would decline treatments  DEXA: 09/2015  Osteoporosis - declines further, would not take any treatments  Names of Other Physician/Practitioners you currently use: 1. [facility]- here for primary care 2. Dr. Elmer Picker, eye doctor, last visit last visit 2023  3. Dr. Alvester Morin, dentist, last visit 2023, goes q31m  Patient Care Team: Lucky Cowboy, MD as PCP - General (Internal Medicine)  Allergies Allergies  Allergen Reactions   Ace Inhibitors     Hyperkalemia    Angiotensin Receptor Blockers     Hyperkalemia    SURGICAL HISTORY She  has a past surgical history that includes Cataract extraction (Bilateral). FAMILY HISTORY Her family history includes Breast cancer in her daughter; CVA in her father and mother; Cerebral aneurysm in her sister. SOCIAL HISTORY She  reports that she has never smoked. She has never used smokeless tobacco. She reports current alcohol use of about 1.0 standard drink of alcohol per week. She reports that she does not use drugs.  MEDICARE WELLNESS OBJECTIVES: Physical activity: Current Exercise Habits: Home exercise routine, Type of exercise: Other - see comments (tationary bike), Time (Minutes): 30, Frequency (Times/Week): 5, Weekly Exercise (Minutes/Week): 150, Intensity: Mild, Exercise limited by: None identified Cardiac risk factors: Cardiac Risk Factors include: advanced age (>36men, >57 women);dyslipidemia;hypertension Depression/mood screen:      04/24/2022   10:47 AM  Depression screen PHQ 2/9  Decreased Interest 0  Down, Depressed, Hopeless 0  PHQ - 2 Score 0    ADLs:     04/24/2022   10:47 AM 10/02/2021   10:43 AM  In your present state of health, do you have any difficulty performing the following activities:  Hearing? 0 0  Vision? 0 0   Difficulty concentrating or making decisions? 0 0  Walking or climbing stairs? 0 0  Dressing or bathing? 0 0  Doing errands, shopping? 0 0     Cognitive Testing  Alert? Yes  Normal Appearance?Yes  Oriented to person? Yes  Place? Yes   Time? Yes  Recall of three objects?  Yes  Can perform simple calculations? Yes  Displays appropriate judgment?Yes  Can read the correct time from a watch face?Yes  EOL planning: Does Patient Have a Medical Advance Directive?: Yes Type of Advance Directive: Healthcare Power of Attorney, Living will Does patient want to make changes to medical advance directive?: No - Patient declined Copy of Healthcare Power of Attorney in Chart?: No - copy requested     Review of Systems  Constitutional:  Negative for malaise/fatigue and weight loss.  HENT:  Negative for hearing loss  and tinnitus.   Eyes:  Negative for blurred vision and double vision.  Respiratory:  Negative for cough, shortness of breath and wheezing.   Cardiovascular:  Negative for chest pain, palpitations, orthopnea, claudication and leg swelling.  Gastrointestinal:  Positive for constipation (intermittent). Negative for abdominal pain, blood in stool, diarrhea, heartburn, melena, nausea and vomiting.  Genitourinary: Negative.   Musculoskeletal:  Positive for joint pain (knees, R shoulder; chronic, mild). Negative for myalgias.  Skin:  Negative for rash.  Neurological:  Negative for dizziness, tingling, sensory change, weakness and headaches.  Endo/Heme/Allergies:  Negative for polydipsia.  Psychiatric/Behavioral: Negative.    All other systems reviewed and are negative.    Objective:   Today's Vitals   04/24/22 1027  BP: 128/70  Pulse: 84  Temp: (!) 97.5 F (36.4 C)  SpO2: 98%  Weight: 122 lb (55.3 kg)  Height: 5' (1.524 m)    Body mass index is 23.83 kg/m.  General appearance: alert, no distress, WD/WN,  female HEENT: normocephalic, sclerae anicteric; bil EAC obstructed  by soft wax; TMs clear, non-erythematous, non-bulging; nares patent, no discharge or erythema, pharynx normal Oral cavity: MMM, no lesions Neck: supple, no lymphadenopathy, no thyromegaly, no masses Heart: RRR, normal S1, S2, no murmurs Lungs: CTA bilaterally, no wheezes, rhonchi, or rales Abdomen: +bs, soft, non tender, non distended, no masses, no hepatomegaly, no splenomegaly Musculoskeletal: nontender, no swelling, no obvious deformity. She has limited ROM to L shoulder, abduction past 90 degrees is limited (per patient ongoing for multiple years, declined surgery)  Extremities: no edema, no cyanosis, no clubbing  Pulses: 2+ symmetric, upper and lower extremities, normal cap refill Neurological: alert, oriented x 3, CN2-12 intact, strength normal upper extremities and lower extremities, sensation normal throughout, DTRs 2+ throughout, no cerebellar signs, gait normal Psychiatric: normal affect, behavior normal, pleasant    Medicare Attestation I have personally reviewed: The patient's medical and social history Their use of alcohol, tobacco or illicit drugs Their current medications and supplements The patient's functional ability including ADLs,fall risks, home safety risks, cognitive, and hearing and visual impairment Diet and physical activities Evidence for depression or mood disorders  The patient's weight, height, BMI, and visual acuity have been recorded in the chart.  I have made referrals, counseling, and provided education to the patient based on review of the above and I have provided the patient with a written personalized care plan for preventive services.     Alycia Rossetti, NP   04/24/2022

## 2022-04-24 ENCOUNTER — Encounter: Payer: Self-pay | Admitting: Nurse Practitioner

## 2022-04-24 ENCOUNTER — Ambulatory Visit (INDEPENDENT_AMBULATORY_CARE_PROVIDER_SITE_OTHER): Payer: PPO | Admitting: Nurse Practitioner

## 2022-04-24 VITALS — BP 128/70 | HR 84 | Temp 97.5°F | Ht 60.0 in | Wt 122.0 lb

## 2022-04-24 DIAGNOSIS — I10 Essential (primary) hypertension: Secondary | ICD-10-CM | POA: Diagnosis not present

## 2022-04-24 DIAGNOSIS — E559 Vitamin D deficiency, unspecified: Secondary | ICD-10-CM | POA: Diagnosis not present

## 2022-04-24 DIAGNOSIS — N1831 Chronic kidney disease, stage 3a: Secondary | ICD-10-CM | POA: Diagnosis not present

## 2022-04-24 DIAGNOSIS — D631 Anemia in chronic kidney disease: Secondary | ICD-10-CM | POA: Diagnosis not present

## 2022-04-24 DIAGNOSIS — M150 Primary generalized (osteo)arthritis: Secondary | ICD-10-CM

## 2022-04-24 DIAGNOSIS — E782 Mixed hyperlipidemia: Secondary | ICD-10-CM | POA: Diagnosis not present

## 2022-04-24 DIAGNOSIS — R7309 Other abnormal glucose: Secondary | ICD-10-CM | POA: Diagnosis not present

## 2022-04-24 DIAGNOSIS — Z0001 Encounter for general adult medical examination with abnormal findings: Secondary | ICD-10-CM | POA: Diagnosis not present

## 2022-04-24 DIAGNOSIS — M159 Polyosteoarthritis, unspecified: Secondary | ICD-10-CM | POA: Diagnosis not present

## 2022-04-24 DIAGNOSIS — R6889 Other general symptoms and signs: Secondary | ICD-10-CM

## 2022-04-24 DIAGNOSIS — Z79899 Other long term (current) drug therapy: Secondary | ICD-10-CM

## 2022-04-24 DIAGNOSIS — Z0000 Encounter for general adult medical examination without abnormal findings: Secondary | ICD-10-CM

## 2022-04-24 NOTE — Patient Instructions (Signed)

## 2022-04-25 DIAGNOSIS — H353132 Nonexudative age-related macular degeneration, bilateral, intermediate dry stage: Secondary | ICD-10-CM | POA: Diagnosis not present

## 2022-04-25 LAB — COMPLETE METABOLIC PANEL WITH GFR
AG Ratio: 1.6 (calc) (ref 1.0–2.5)
ALT: 10 U/L (ref 6–29)
AST: 18 U/L (ref 10–35)
Albumin: 4.7 g/dL (ref 3.6–5.1)
Alkaline phosphatase (APISO): 72 U/L (ref 37–153)
BUN/Creatinine Ratio: 19 (calc) (ref 6–22)
BUN: 23 mg/dL (ref 7–25)
CO2: 27 mmol/L (ref 20–32)
Calcium: 10.3 mg/dL (ref 8.6–10.4)
Chloride: 99 mmol/L (ref 98–110)
Creat: 1.24 mg/dL — ABNORMAL HIGH (ref 0.60–0.95)
Globulin: 2.9 g/dL (calc) (ref 1.9–3.7)
Glucose, Bld: 108 mg/dL — ABNORMAL HIGH (ref 65–99)
Potassium: 5 mmol/L (ref 3.5–5.3)
Sodium: 139 mmol/L (ref 135–146)
Total Bilirubin: 0.4 mg/dL (ref 0.2–1.2)
Total Protein: 7.6 g/dL (ref 6.1–8.1)
eGFR: 42 mL/min/{1.73_m2} — ABNORMAL LOW (ref >=60)

## 2022-04-25 LAB — LIPID PANEL
Cholesterol: 192 mg/dL (ref <200)
HDL: 68 mg/dL (ref >=50)
LDL Cholesterol (Calc): 101 mg/dL (calc) — ABNORMAL HIGH
Non-HDL Cholesterol (Calc): 124 mg/dL (calc) (ref <130)
Total CHOL/HDL Ratio: 2.8 (calc) (ref <5.0)
Triglycerides: 134 mg/dL (ref <150)

## 2022-04-25 LAB — CBC WITH DIFFERENTIAL/PLATELET
Absolute Monocytes: 535 cells/uL (ref 200–950)
Basophils Absolute: 33 cells/uL (ref 0–200)
Basophils Relative: 0.5 %
Eosinophils Absolute: 59 cells/uL (ref 15–500)
Eosinophils Relative: 0.9 %
HCT: 36.1 % (ref 35.0–45.0)
Hemoglobin: 11.7 g/dL (ref 11.7–15.5)
Lymphs Abs: 1604 cells/uL (ref 850–3900)
MCH: 27 pg (ref 27.0–33.0)
MCHC: 32.4 g/dL (ref 32.0–36.0)
MCV: 83.4 fL (ref 80.0–100.0)
MPV: 12.1 fL (ref 7.5–12.5)
Monocytes Relative: 8.1 %
Neutro Abs: 4369 cells/uL (ref 1500–7800)
Neutrophils Relative %: 66.2 %
Platelets: 227 10*3/uL (ref 140–400)
RBC: 4.33 10*6/uL (ref 3.80–5.10)
RDW: 13.6 % (ref 11.0–15.0)
Total Lymphocyte: 24.3 %
WBC: 6.6 10*3/uL (ref 3.8–10.8)

## 2022-04-25 LAB — MAGNESIUM: Magnesium: 1.9 mg/dL (ref 1.5–2.5)

## 2022-05-01 ENCOUNTER — Encounter: Payer: PPO | Admitting: Adult Health

## 2022-05-05 ENCOUNTER — Encounter: Payer: PPO | Admitting: Internal Medicine

## 2022-05-15 DIAGNOSIS — H04123 Dry eye syndrome of bilateral lacrimal glands: Secondary | ICD-10-CM | POA: Diagnosis not present

## 2022-05-15 DIAGNOSIS — H353132 Nonexudative age-related macular degeneration, bilateral, intermediate dry stage: Secondary | ICD-10-CM | POA: Diagnosis not present

## 2022-05-15 DIAGNOSIS — H43813 Vitreous degeneration, bilateral: Secondary | ICD-10-CM | POA: Diagnosis not present

## 2022-05-15 DIAGNOSIS — H35033 Hypertensive retinopathy, bilateral: Secondary | ICD-10-CM | POA: Diagnosis not present

## 2022-05-25 DIAGNOSIS — H353132 Nonexudative age-related macular degeneration, bilateral, intermediate dry stage: Secondary | ICD-10-CM | POA: Diagnosis not present

## 2022-05-30 ENCOUNTER — Other Ambulatory Visit: Payer: Self-pay

## 2022-05-30 DIAGNOSIS — I10 Essential (primary) hypertension: Secondary | ICD-10-CM

## 2022-05-30 MED ORDER — AMLODIPINE BESYLATE 5 MG PO TABS: ORAL_TABLET | ORAL | 3 refills | Status: DC

## 2022-06-24 DIAGNOSIS — H353132 Nonexudative age-related macular degeneration, bilateral, intermediate dry stage: Secondary | ICD-10-CM | POA: Diagnosis not present

## 2022-07-09 ENCOUNTER — Other Ambulatory Visit: Payer: Self-pay

## 2022-07-09 DIAGNOSIS — E782 Mixed hyperlipidemia: Secondary | ICD-10-CM

## 2022-07-09 MED ORDER — ROSUVASTATIN CALCIUM 10 MG PO TABS: ORAL_TABLET | ORAL | 3 refills | Status: DC

## 2022-07-24 DIAGNOSIS — H353132 Nonexudative age-related macular degeneration, bilateral, intermediate dry stage: Secondary | ICD-10-CM | POA: Diagnosis not present

## 2022-08-07 ENCOUNTER — Encounter: Payer: Self-pay | Admitting: Internal Medicine

## 2022-08-07 NOTE — Patient Instructions (Signed)

## 2022-08-07 NOTE — Progress Notes (Signed)
Annual Screening/Preventative Visit & Comprehensive Evaluation &  Examination      This very nice 89 y.o.  WWF with HTN, HLD, Prediabetes  and Vitamin D Deficiency  presents for a Screening /Preventative Visit & comprehensive evaluation and management of multiple medical co-morbidities.         HTN predates circa 1997. Patient's BP has been controlled at home and patient denies any cardiac symptoms as chest pain, palpitations, shortness of breath, dizziness or ankle swelling. Patient has CKD3b  (GFR 42) which is attributed to her HTN.  Today's BP is at goal -  130/62 .        Patient's hyperlipidemia is controlled with diet and  Rosuvastatin. Patient denies myalgias or other medication SE's. Last lipids were  near  goal:  Lab Results  Component Value Date   CHOL 192 04/24/2022   HDL 68 04/24/2022   LDLCALC 101 (H) 04/24/2022   TRIG 134 04/24/2022   CHOLHDL 2.8 04/24/2022        Patient has hx/o prediabetes  (A1c 6.1% /2010) and patient denies reactive hypoglycemic symptoms, visual blurring, diabetic polys or paresthesias. Last A1c was not at goal:  Lab Results  Component Value Date   HGBA1C 6.2 (H) 01/22/2022        Finally, patient has history of Vitamin D ("25" /2008) and                                                                                                     last Vitamin D was near  goal: (70-100):  Lab Results  Component Value Date   VD25OH 51 01/22/2022       Current Outpatient Medications  Medication Instructions   amLODipine (NORVASC) 5 MG tablet Take 1 tablet daily in the morning for blood pressure.   aspirin 81 mg, Oral, Daily   Cholecalciferol (VITAMIN D3) 50 MCG (2000 UT) TABS Taking 6000 units daily   Cyanocobalamin (VITAMIN B-12 PO) 1 tablet, Oral, 3 times weekly   diclofenac Sodium (VOLTAREN) 4 g, Topical, 4 times daily   fish oil-omega-3 fatty acids 2 g, Oral, Daily, 1400 mg   hydrochlorothiazide (HYDRODIURIL) 25 MG tablet Take 1  tablet Daily for blood pressure and fluid retention.   OVER THE COUNTER MEDICATION Takes vitamin for her eyes 1 daily    rosuvastatin (CRESTOR) 10 MG tablet Take  1 tablet  Daily  for Cholesterol.     Allergies  Allergen Reactions   Ace Inhibitors     Hyperkalemia     Past Medical History:  Diagnosis Date   Anemia of chronic renal failure    Chronic renal insufficiency    Degenerative joint disease    HTN (hypertension)    Hyperlipidemia    Shingles    Vitamin D deficiency    Health Maintenance  Topic Date Due   COVID-19 Vaccine (3 - Booster for Pfizer series) 01/01/2020   TETANUS/TDAP  10/24/2026   INFLUENZA VACCINE  Completed   DEXA SCAN  Completed   PNA vac Low Risk Adult  Completed   Immunization  History  Administered Date(s) Administered   Influenza Split 01/17/2013   Influenza, High Dose Seasonal PF 01/02/2014, 01/16/2015, 01/02/2016, 01/19/2018, 01/05/2019, 01/10/2020   PFIZER SARS-COV-2 Vaccination 06/11/2019, 07/02/2019   Pneumococcal Conjugate-13 06/08/2015   Pneumococcal Polysaccharide-23 06/04/2010   Pneumococcal-Unspecified 01/31/1999   Td 04/01/1995, 12/05/2005, 10/23/2016    Last Colon - 2006 - Dr Juanda Chance - f/u deferred to age.   Last MGM - 04/19/2012  No past surgical history on file.   Family History  Problem Relation Age of Onset   CVA Father    Cancer Daughter        breast   Social History   Tobacco Use   Smoking status: Never Smoker   Smokeless tobacco: Never Used  Substance Use Topics   Alcohol use: Yes    Alcohol/week: 1.0 standard drink    Types: 1 Glasses of wine per week    Comment: occasional wine   Drug use: No    ROS Constitutional: Denies fever, chills, weight loss/gain, headaches, insomnia,  night sweats, and change in appetite. Does c/o fatigue. Eyes: Denies redness, blurred vision, diplopia, discharge, itchy, watery eyes.  ENT: Denies discharge, congestion, post nasal drip, epistaxis, sore throat, earache, hearing  loss, dental pain, Tinnitus, Vertigo, Sinus pain, snoring.  Cardio: Denies chest pain, palpitations, irregular heartbeat, syncope, dyspnea, diaphoresis, orthopnea, PND, claudication, edema Respiratory: denies cough, dyspnea, DOE, pleurisy, hoarseness, laryngitis, wheezing.  Gastrointestinal: Denies dysphagia, heartburn, reflux, water brash, pain, cramps, nausea, vomiting, bloating, diarrhea, constipation, hematemesis, melena, hematochezia, jaundice, hemorrhoids Genitourinary: Denies dysuria, frequency, urgency, nocturia, hesitancy, discharge, hematuria, flank pain Breast: Breast lumps, nipple discharge, bleeding.  Musculoskeletal: Denies arthralgia, myalgia, stiffness, Jt. Swelling, pain, limp, and strain/sprain. Denies falls. Skin: Denies puritis, rash, hives, warts, acne, eczema, changing in skin lesion Neuro: No weakness, tremor, incoordination, spasms, paresthesia, pain Psychiatric: Denies confusion, memory loss, sensory loss. Denies Depression. Endocrine: Denies change in weight, skin, hair change, nocturia, and paresthesia, diabetic polys, visual blurring, hyper / hypo glycemic episodes.  Heme/Lymph: No excessive bleeding, bruising, enlarged lymph nodes.  Physical Exam  BP 130/62   Pulse 69   Temp 97.8 F (36.6 C)   Ht 5' 1.25" (1.556 m)   Wt 120 lb 9.6 oz (54.7 kg)   SpO2 99%   BMI 22.60 kg/m   General Appearance: Well nourished, well groomed and in no apparent distress.  Eyes: PERRLA, EOMs, conjunctiva no swelling or erythema, normal fundi and vessels. Sinuses: No frontal/maxillary tenderness ENT/Mouth: EACs patent / TMs  nl. Nares clear without erythema, swelling, mucoid exudates. Oral hygiene is good. No erythema, swelling, or exudate. Tongue normal, non-obstructing. Tonsils not swollen or erythematous. Hearing normal.  Neck: Supple, thyroid not palpable. No bruits, nodes or JVD. Respiratory: Respiratory effort normal.  BS equal and clear bilateral without rales, rhonci,  wheezing or stridor. Cardio: Heart sounds are normal with regular rate and rhythm and no murmurs, rubs or gallops. Peripheral pulses are normal and equal bilaterally without edema. No aortic or femoral bruits. Chest: symmetric with normal excursions and percussion. Breasts: Symmetric, without lumps, nipple discharge, retractions, or fibrocystic changes.  Abdomen: Flat, soft with bowel sounds active. Nontender, no guarding, rebound, hernias, masses, or organomegaly.  Lymphatics: Non tender without lymphadenopathy.  Genitourinary:  Musculoskeletal: Full ROM all peripheral extremities, joint stability, 5/5 strength, and normal gait. Skin: Warm and dry without rashes, lesions, cyanosis, clubbing or  ecchymosis.  Neuro: Cranial nerves intact, reflexes equal bilaterally. Normal muscle tone, no cerebellar symptoms. Sensation intact.  Pysch: Alert and oriented  X 3, normal affect, Insight and Judgment appropriate.   Assessment and Plan  1. Annual Preventative Screening Examination   2. Essential hypertension  - EKG 12-Lead - Urinalysis, Routine w reflex microscopic - Microalbumin / creatinine urine ratio - CBC with Differential/Platelet - COMPLETE METABOLIC PANEL WITH GFR - Magnesium - TSH  3. Hyperlipidemia, mixed  - EKG 12-Lead - TSH - Lipid panel  4. Vitamin D deficiency  - VITAMIN D 25 Hydroxy  5. Abnormal glucose  - Hemoglobin A1c - Insulin, random  6. Stage 3b chronic kidney disease (HCC)  - Urinalysis, Routine w reflex microscopic - Microalbumin / creatinine urine ratio - COMPLETE METABOLIC PANEL WITH GFR  7. Vitamin B12 deficiency  - Vitamin B12  8. Screening for colorectal cancer  - POC Hemoccult Bld/Stl (3-Cd Home Screen); Future  9. Screening for heart disease  - EKG 12-Lead  10. Family history of cerebrovascular accident  - EKG 12-Lead  11. Medication management  - EKG 12-Lead - Urinalysis, Routine w reflex microscopic - Microalbumin / creatinine  urine ratio - POC Hemoccult Bld/ - CBC with Differential/Platelet - COMPLETE METABOLIC PANEL WITH GFR - Magnesium - TSH - Hemoglobin A1c - Insulin, random - VITAMIN D 25 Hydroxy - Lipid panel - Vitamin B12          Patient was counseled in prudent diet to achieve/maintain BMI less than 25 for weight control, BP monitoring, regular exercise and medications. Discussed med's effects and SE's. Screening labs and tests as requested with regular follow-up as recommended. Over 40 minutes of exam, counseling, chart review and high complex critical decision making was performed.   Marinus Maw, MD

## 2022-08-08 ENCOUNTER — Encounter: Payer: Self-pay | Admitting: Internal Medicine

## 2022-08-08 ENCOUNTER — Ambulatory Visit (INDEPENDENT_AMBULATORY_CARE_PROVIDER_SITE_OTHER): Payer: PPO | Admitting: Internal Medicine

## 2022-08-08 VITALS — BP 130/62 | HR 69 | Temp 97.8°F | Ht 61.25 in | Wt 120.6 lb

## 2022-08-08 DIAGNOSIS — Z0001 Encounter for general adult medical examination with abnormal findings: Secondary | ICD-10-CM

## 2022-08-08 DIAGNOSIS — Z0000 Encounter for general adult medical examination without abnormal findings: Secondary | ICD-10-CM | POA: Diagnosis not present

## 2022-08-08 DIAGNOSIS — Z1211 Encounter for screening for malignant neoplasm of colon: Secondary | ICD-10-CM

## 2022-08-08 DIAGNOSIS — N1832 Chronic kidney disease, stage 3b: Secondary | ICD-10-CM | POA: Diagnosis not present

## 2022-08-08 DIAGNOSIS — Z79899 Other long term (current) drug therapy: Secondary | ICD-10-CM

## 2022-08-08 DIAGNOSIS — E782 Mixed hyperlipidemia: Secondary | ICD-10-CM

## 2022-08-08 DIAGNOSIS — R7309 Other abnormal glucose: Secondary | ICD-10-CM

## 2022-08-08 DIAGNOSIS — I10 Essential (primary) hypertension: Secondary | ICD-10-CM | POA: Diagnosis not present

## 2022-08-08 DIAGNOSIS — E559 Vitamin D deficiency, unspecified: Secondary | ICD-10-CM

## 2022-08-08 DIAGNOSIS — Z136 Encounter for screening for cardiovascular disorders: Secondary | ICD-10-CM

## 2022-08-08 DIAGNOSIS — E538 Deficiency of other specified B group vitamins: Secondary | ICD-10-CM

## 2022-08-08 DIAGNOSIS — Z823 Family history of stroke: Secondary | ICD-10-CM

## 2022-08-09 LAB — VITAMIN D 25 HYDROXY (VIT D DEFICIENCY, FRACTURES): Vit D, 25-Hydroxy: 43 ng/mL (ref 30–100)

## 2022-08-09 LAB — LIPID PANEL: Non-HDL Cholesterol (Calc): 121 mg/dL (calc) (ref <130)

## 2022-08-11 ENCOUNTER — Encounter: Payer: Self-pay | Admitting: Internal Medicine

## 2022-08-11 LAB — MICROALBUMIN / CREATININE URINE RATIO
Creatinine, Urine: 78 mg/dL (ref 20–275)
Microalb Creat Ratio: 14 mg/g creat (ref <30)
Microalb, Ur: 1.1 mg/dL

## 2022-08-11 LAB — CBC WITH DIFFERENTIAL/PLATELET
Absolute Monocytes: 422 cells/uL (ref 200–950)
Basophils Absolute: 38 cells/uL (ref 0–200)
Basophils Relative: 0.6 %
Eosinophils Absolute: 58 cells/uL (ref 15–500)
Eosinophils Relative: 0.9 %
HCT: 35.7 % (ref 35.0–45.0)
Hemoglobin: 11.5 g/dL — ABNORMAL LOW (ref 11.7–15.5)
Lymphs Abs: 1888 cells/uL (ref 850–3900)
MCH: 27.6 pg (ref 27.0–33.0)
MCHC: 32.2 g/dL (ref 32.0–36.0)
MCV: 85.6 fL (ref 80.0–100.0)
MPV: 12.3 fL (ref 7.5–12.5)
Monocytes Relative: 6.6 %
Neutro Abs: 3994 cells/uL (ref 1500–7800)
Neutrophils Relative %: 62.4 %
Platelets: 222 10*3/uL (ref 140–400)
RBC: 4.17 10*6/uL (ref 3.80–5.10)
RDW: 13.6 % (ref 11.0–15.0)
Total Lymphocyte: 29.5 %
WBC: 6.4 10*3/uL (ref 3.8–10.8)

## 2022-08-11 LAB — COMPLETE METABOLIC PANEL WITH GFR
AG Ratio: 1.9 (calc) (ref 1.0–2.5)
ALT: 10 U/L (ref 6–29)
AST: 19 U/L (ref 10–35)
Albumin: 5 g/dL (ref 3.6–5.1)
Alkaline phosphatase (APISO): 62 U/L (ref 37–153)
BUN/Creatinine Ratio: 21 (calc) (ref 6–22)
BUN: 26 mg/dL — ABNORMAL HIGH (ref 7–25)
CO2: 28 mmol/L (ref 20–32)
Calcium: 10.2 mg/dL (ref 8.6–10.4)
Chloride: 99 mmol/L (ref 98–110)
Creat: 1.21 mg/dL — ABNORMAL HIGH (ref 0.60–0.95)
Globulin: 2.6 g/dL (calc) (ref 1.9–3.7)
Glucose, Bld: 103 mg/dL — ABNORMAL HIGH (ref 65–99)
Potassium: 5 mmol/L (ref 3.5–5.3)
Sodium: 142 mmol/L (ref 135–146)
Total Bilirubin: 0.5 mg/dL (ref 0.2–1.2)
Total Protein: 7.6 g/dL (ref 6.1–8.1)
eGFR: 43 mL/min/{1.73_m2} — ABNORMAL LOW (ref >=60)

## 2022-08-11 LAB — URINALYSIS, ROUTINE W REFLEX MICROSCOPIC
Bilirubin Urine: NEGATIVE
Glucose, UA: NEGATIVE
Hgb urine dipstick: NEGATIVE
Hyaline Cast: NONE SEEN /LPF
Ketones, ur: NEGATIVE
Nitrite: NEGATIVE
Protein, ur: NEGATIVE
RBC / HPF: NONE SEEN /HPF (ref 0–2)
Specific Gravity, Urine: 1.012 (ref 1.001–1.035)
Squamous Epithelial / HPF: NONE SEEN /HPF (ref <=5)
pH: 5.5 (ref 5.0–8.0)

## 2022-08-11 LAB — LIPID PANEL
Cholesterol: 186 mg/dL (ref <200)
HDL: 65 mg/dL (ref >=50)
LDL Cholesterol (Calc): 95 mg/dL (calc)
Total CHOL/HDL Ratio: 2.9 (calc) (ref <5.0)
Triglycerides: 162 mg/dL — ABNORMAL HIGH (ref <150)

## 2022-08-11 LAB — INSULIN, RANDOM: Insulin: 9.9 u[IU]/mL

## 2022-08-11 LAB — VITAMIN B12: Vitamin B-12: 1715 pg/mL — ABNORMAL HIGH (ref 200–1100)

## 2022-08-11 LAB — HEMOGLOBIN A1C
Hgb A1c MFr Bld: 6.1 % of total Hgb — ABNORMAL HIGH (ref <5.7)
Mean Plasma Glucose: 128 mg/dL
eAG (mmol/L): 7.1 mmol/L

## 2022-08-11 LAB — MAGNESIUM: Magnesium: 2.1 mg/dL (ref 1.5–2.5)

## 2022-08-11 LAB — TSH: TSH: 1.75 mIU/L (ref 0.40–4.50)

## 2022-08-23 DIAGNOSIS — H353132 Nonexudative age-related macular degeneration, bilateral, intermediate dry stage: Secondary | ICD-10-CM | POA: Diagnosis not present

## 2022-08-26 ENCOUNTER — Other Ambulatory Visit: Payer: Self-pay | Admitting: Internal Medicine

## 2022-09-22 DIAGNOSIS — H353132 Nonexudative age-related macular degeneration, bilateral, intermediate dry stage: Secondary | ICD-10-CM | POA: Diagnosis not present

## 2022-10-03 ENCOUNTER — Ambulatory Visit: Payer: PPO | Admitting: Nurse Practitioner

## 2022-10-22 DIAGNOSIS — H353132 Nonexudative age-related macular degeneration, bilateral, intermediate dry stage: Secondary | ICD-10-CM | POA: Diagnosis not present

## 2022-11-13 NOTE — Progress Notes (Signed)
FOLLOW UP   Assessment:   Essential hypertension Well controlled with current medications- Amlodipine 5 mg every day and hydrochlorothiazide 25 mg every day  Monitor blood pressure at home; call if consistently over 130/80 Continue DASH diet.   Reminder to go to the ER if any CP, SOB, nausea, dizziness, severe HA, changes vision/speech, left arm numbness and tingling and jaw pain.  Osteoporosis, unspecified osteoporosis type, unspecified pathological fracture presence Continue Vit D and weight bearing exercises Declines further DEXA- would not do treatment  CKD Stage III  (GFR 30-59 ml/min) Increase fluids, avoid NSAIDS, monitor sugars, will monitor closely She prefers to avoid nephrology referral unless absolutely necessary   Anemia r/t CKD III(HCC) Monitor CBC - Iron/ferritin Panel  Hyperlipidemia, unspecified hyperlipidemia type Titrate statin for LDL goal <100- rosuvastatin 10 mg every day  Discussed dietary and exercise modifications Low fat diet Lipid panel  Abnormal glucose Discussed disease and risks Discussed diet/exercise, weight management  A1C q63m CMP  Vitamin D deficiency Continue supplement Check level annually and PRN  Primary osteoarthritis involving multiple joints Tylenol, avoid oral NSAIDs Continue Voltaren gel QID as needed Follow up ortho PRN, states currently managing  Medication management Magnesium  Intermediate stage nonexudative age-related macular degeneration of both eyes.  Follows with Dr. Elmer Picker Uses [facility] machine at home and Dr. Elmer Picker can see readings.      Further disposition pending results if labs check today. Discussed med's effects and SE's.   Over 30 minutes of face to face interview, exam, counseling, chart review, and critical decision making was performed.   Future Appointments  Date Time Provider Department Center  02/20/2023 10:30 AM Raynelle Dick, NP [facility] None  04/14/2023 11:00 AM Raynelle Dick, NP [facility] None  08/13/2023 10:00 AM Lucky Cowboy, MD [facility] None      Subjective:   Ann [name] is a 89 y.o. pleasant female who presents for follow up. She has Essential hypertension; Hyperlipidemia, mixed; Vitamin D deficiency; Degenerative joint disease; Anemia of chronic renal failure, stage 3 (moderate) (HCC); Medication management; Other abnormal glucose (prediabetes); Osteoporosis; Vitamin B12 deficiency; CKD (chronic kidney disease) stage 3, GFR 30-59 ml/min (HCC); Urinary incontinence; and Hyperkalemia on their problem list.  She reports would not want cancer treatment and declines further screening.   Reports she is taking tylenol intermittently for her bilateral knees and L shoulder arthritis, has seen ortho. She also uses diclofenac gel as needed. She will take Tylenol as needed.  BMI is Body mass index is 22.3 kg/m., she has been working on diet and exercise, rides stationary bike 20 min intermittently, does daily stretching/strength exercises. She is very active and works in her yard and garden, currently very active with raking and Firefighter. She has switched to [employer] bread.  Wt Readings from Last 3 Encounters:  11/14/22 119 lb (54 kg)  08/08/22 120 lb 9.6 oz (54.7 kg)  04/24/22 122 lb (55.3 kg)   Her blood pressure has been controlled at home with amlodipine 5 mg every day and HCTZ 25 mg every day , today their BP is BP: 126/66   BP Readings from Last 3 Encounters:  11/14/22 126/66  08/08/22 130/62  04/24/22 128/70  She does workout. She denies chest pain, shortness of breath, dizziness.    She is on cholesterol medication (Rosuvastatin 10 mg daily) and denies myalgias. Her cholesterol is not at goal, would like LDL < 70. Both parents ? Had CVA later in life, she wishes to continue  on ASA (takes every other day). The cholesterol last visit was:   Lab Results  Component Value Date   CHOL 186 08/08/2022   HDL 65 08/08/2022   LDLCALC 95  08/08/2022   TRIG 162 (H) 08/08/2022   CHOLHDL 2.9 08/08/2022    She has been working on diet and exercise for prediabetes, and denies increased appetite, nausea, paresthesia of the feet, polydipsia, polyuria and visual disturbances. Last A1C in the office was:  Lab Results  Component Value Date   HGBA1C 6.1 (H) 08/08/2022    She tries to push fluids throughout the day. She has CKD IIIa monitored at this office:  Lab Results  Component Value Date   EGFR 43 (L) 08/08/2022   EGFR 42 (L) 04/24/2022   EGFR 46 (L) 01/22/2022   She has been followed at [facility] for intermediate stage nonexudative age-related macular degeneration of both eyes.   She has chronic mild anemia, of chronic disease, stable:     Latest Ref Rng & Units 08/08/2022   11:16 AM 04/24/2022   12:00 AM 01/22/2022   12:00 AM  CBC  WBC 3.8 - 10.8 Thousand/uL 6.4  6.6  5.3   Hemoglobin 11.7 - 15.5 g/dL 16.1  09.6  04.5   Hematocrit 35.0 - 45.0 % 35.7  36.1  34.7   Platelets 140 - 400 Thousand/uL 222  227  225    Lab Results  Component Value Date   IRON 61 05/01/2021   TIBC 339 05/01/2021   FERRITIN 116 05/01/2021    She takes B12 SL, unsure of dose, reports has reduced frequency taking once a week Lab Results  Component Value Date   VITAMINB12 1,715 (H) 08/08/2022   Patient is on Vitamin D supplement.   Lab Results  Component Value Date   VD25OH 43 08/08/2022        Medication Review Current Outpatient Medications on File Prior to Visit  Medication Sig Dispense Refill   amLODipine (NORVASC) 5 MG tablet Take 1 tablet daily in the morning for blood pressure. 90 tablet 3   aspirin 81 MG tablet Take 81 mg by mouth daily.     Cholecalciferol (VITAMIN D3) 50 MCG (2000 UT) TABS Taking 6000 units daily (Patient taking differently: 5,000 Units. Taking 6000 units daily) 30 tablet    Cyanocobalamin (VITAMIN B-12 PO) Take 1 tablet by mouth once a week.     diclofenac Sodium (VOLTAREN) 1 % GEL Apply 4 g  topically 4 (four) times daily. 100 g 3   fish oil-omega-3 fatty acids 1000 MG capsule Take 2 g by mouth daily. 1400 mg     hydrochlorothiazide (HYDRODIURIL) 25 MG tablet TAKE 1 TABLET DAILY FOR BLOOD PRESSURE AND FLUID RETENTION 90 tablet 3   OVER THE COUNTER MEDICATION Takes vitamin for her eyes 1 daily     rosuvastatin (CRESTOR) 10 MG tablet Take  1 tablet  Daily  for Cholesterol. 90 tablet 3   No current facility-administered medications on file prior to visit.    Current Problems (verified) Patient Active Problem List   Diagnosis Date Noted   Hyperkalemia 06/04/2021   Urinary incontinence 05/23/2019   CKD (chronic kidney disease) stage 3, GFR 30-59 ml/min (HCC) 05/20/2019   Vitamin B12 deficiency 12/06/2017   Osteoporosis 10/15/2015   Other abnormal glucose (prediabetes) 01/01/2014   Medication management 06/27/2013   Essential hypertension    Hyperlipidemia, mixed    Vitamin D deficiency    Degenerative joint disease    Anemia  of chronic renal failure, stage 3 (moderate) (HCC)      Names of Other Physician/Practitioners you currently use: 1. [facility]- here for primary care 2. Dr. Elmer Picker, eye doctor, last visit last visit 05/15/22 3. Dr. Alvester Morin, dentist, last visit 2023, goes q61m  Patient Care Team: Lucky Cowboy, MD as PCP - General (Internal Medicine)  Allergies Allergies  Allergen Reactions   Ace Inhibitors     Hyperkalemia    Angiotensin Receptor Blockers     Hyperkalemia    SURGICAL HISTORY She  has a past surgical history that includes Cataract extraction (Bilateral). FAMILY HISTORY Her family history includes Breast cancer in her daughter; CVA in her father and mother; Cerebral aneurysm in her sister. SOCIAL HISTORY She  reports that she has never smoked. She has never used smokeless tobacco. She reports current alcohol use of about 1.0 standard drink of alcohol per week. She reports that she does not use  drugs.       Review of Systems  Constitutional:  Negative for malaise/fatigue and weight loss.  HENT:  Positive for tinnitus. Negative for hearing loss.   Eyes:  Negative for blurred vision and double vision.  Respiratory:  Negative for cough, shortness of breath and wheezing.   Cardiovascular:  Negative for chest pain, palpitations, orthopnea, claudication and leg swelling.  Gastrointestinal:  Positive for constipation (intermittent). Negative for abdominal pain, blood in stool, diarrhea, heartburn, melena, nausea and vomiting.  Genitourinary: Negative.   Musculoskeletal:  Positive for joint pain (knees, R shoulder; chronic, mild). Negative for myalgias.  Skin:  Negative for rash.  Neurological:  Negative for dizziness, tingling, sensory change, weakness and headaches.  Endo/Heme/Allergies:  Negative for polydipsia.  Psychiatric/Behavioral: Negative.    All other systems reviewed and are negative.    Objective:   Today's Vitals   11/14/22 1033  BP: 126/66  Pulse: 71  Temp: 97.7 F (36.5 C)  SpO2: 99%  Weight: 119 lb (54 kg)  Height: 5' 1.25" (1.556 m)     Body mass index is 22.3 kg/m.  General appearance: very pleasant well dressed female in no apparent distress HEENT: normocephalic, sclerae anicteric; TMs clear, non-erythematous, non-bulging; nares patent, no discharge or erythema, pharynx normal Oral cavity: MMM, no lesions Neck: supple, no lymphadenopathy, no thyromegaly, no masses Heart: RRR, normal S1, S2, no murmurs Lungs: CTA bilaterally, no wheezes, rhonchi, or rales Abdomen: +bs, soft, non tender, non distended, no masses, no hepatomegaly, no splenomegaly Musculoskeletal: nontender, no swelling, no obvious deformity. She has limited ROM and pain to L shoulder, abduction past 90 degrees is limited (per patient ongoing for multiple years, declined surgery)  Extremities: no edema, no cyanosis, no clubbing  Pulses: 2+ symmetric, upper and lower extremities,  normal cap refill Neurological: alert, oriented x 3, CN2-12 intact, strength normal upper extremities and lower extremities, sensation normal throughout, DTRs 2+ throughout, no cerebellar signs, gait normal Psychiatric: normal affect, behavior normal, pleasant     Raynelle Dick, NP   11/14/2022

## 2022-11-14 ENCOUNTER — Encounter: Payer: Self-pay | Admitting: Nurse Practitioner

## 2022-11-14 ENCOUNTER — Ambulatory Visit (INDEPENDENT_AMBULATORY_CARE_PROVIDER_SITE_OTHER): Payer: PPO | Admitting: Nurse Practitioner

## 2022-11-14 VITALS — BP 126/66 | HR 71 | Temp 97.7°F | Ht 61.25 in | Wt 119.0 lb

## 2022-11-14 DIAGNOSIS — N1831 Chronic kidney disease, stage 3a: Secondary | ICD-10-CM

## 2022-11-14 DIAGNOSIS — M810 Age-related osteoporosis without current pathological fracture: Secondary | ICD-10-CM | POA: Diagnosis not present

## 2022-11-14 DIAGNOSIS — E782 Mixed hyperlipidemia: Secondary | ICD-10-CM | POA: Diagnosis not present

## 2022-11-14 DIAGNOSIS — N1832 Chronic kidney disease, stage 3b: Secondary | ICD-10-CM

## 2022-11-14 DIAGNOSIS — I10 Essential (primary) hypertension: Secondary | ICD-10-CM | POA: Diagnosis not present

## 2022-11-14 DIAGNOSIS — H353132 Nonexudative age-related macular degeneration, bilateral, intermediate dry stage: Secondary | ICD-10-CM

## 2022-11-14 DIAGNOSIS — Z79899 Other long term (current) drug therapy: Secondary | ICD-10-CM

## 2022-11-14 DIAGNOSIS — R7309 Other abnormal glucose: Secondary | ICD-10-CM | POA: Diagnosis not present

## 2022-11-14 DIAGNOSIS — M159 Polyosteoarthritis, unspecified: Secondary | ICD-10-CM

## 2022-11-14 DIAGNOSIS — E559 Vitamin D deficiency, unspecified: Secondary | ICD-10-CM | POA: Diagnosis not present

## 2022-11-14 DIAGNOSIS — D631 Anemia in chronic kidney disease: Secondary | ICD-10-CM

## 2022-11-14 NOTE — Patient Instructions (Signed)

## 2022-11-15 LAB — CBC WITH DIFFERENTIAL/PLATELET
Absolute Monocytes: 457 {cells}/uL (ref 200–950)
Basophils Absolute: 28 {cells}/uL (ref 0–200)
Basophils Relative: 0.5 %
Eosinophils Absolute: 110 {cells}/uL (ref 15–500)
Eosinophils Relative: 2 %
HCT: 35.7 % (ref 35.0–45.0)
Hemoglobin: 11.3 g/dL — ABNORMAL LOW (ref 11.7–15.5)
Lymphs Abs: 1843 {cells}/uL (ref 850–3900)
MCH: 27.1 pg (ref 27.0–33.0)
MCHC: 31.7 g/dL — ABNORMAL LOW (ref 32.0–36.0)
MCV: 85.6 fL (ref 80.0–100.0)
MPV: 12.2 fL (ref 7.5–12.5)
Monocytes Relative: 8.3 %
Neutro Abs: 3064 {cells}/uL (ref 1500–7800)
Neutrophils Relative %: 55.7 %
Platelets: 226 10*3/uL (ref 140–400)
RBC: 4.17 10*6/uL (ref 3.80–5.10)
RDW: 13.4 % (ref 11.0–15.0)
Total Lymphocyte: 33.5 %
WBC: 5.5 10*3/uL (ref 3.8–10.8)

## 2022-11-15 LAB — COMPLETE METABOLIC PANEL WITH GFR
AG Ratio: 1.6 (calc) (ref 1.0–2.5)
ALT: 10 U/L (ref 6–29)
AST: 17 U/L (ref 10–35)
Albumin: 4.7 g/dL (ref 3.6–5.1)
Alkaline phosphatase (APISO): 62 U/L (ref 37–153)
BUN/Creatinine Ratio: 19 (calc) (ref 6–22)
BUN: 22 mg/dL (ref 7–25)
CO2: 29 mmol/L (ref 20–32)
Calcium: 10.3 mg/dL (ref 8.6–10.4)
Chloride: 100 mmol/L (ref 98–110)
Creat: 1.16 mg/dL — ABNORMAL HIGH (ref 0.60–0.95)
Globulin: 2.9 g/dL (ref 1.9–3.7)
Glucose, Bld: 106 mg/dL — ABNORMAL HIGH (ref 65–99)
Potassium: 5.2 mmol/L (ref 3.5–5.3)
Sodium: 140 mmol/L (ref 135–146)
Total Bilirubin: 0.5 mg/dL (ref 0.2–1.2)
Total Protein: 7.6 g/dL (ref 6.1–8.1)
eGFR: 45 mL/min/{1.73_m2} — ABNORMAL LOW (ref >=60)

## 2022-11-15 LAB — IRON,TIBC AND FERRITIN PANEL
%SAT: 19 % (ref 16–45)
Ferritin: 99 ng/mL (ref 16–288)
Iron: 68 ug/dL (ref 45–160)
TIBC: 357 ug/dL (ref 250–450)

## 2022-11-15 LAB — MAGNESIUM: Magnesium: 1.9 mg/dL (ref 1.5–2.5)

## 2022-11-15 LAB — LIPID PANEL
Cholesterol: 177 mg/dL (ref <200)
HDL: 58 mg/dL (ref >=50)
LDL Cholesterol (Calc): 94 mg/dL
Non-HDL Cholesterol (Calc): 119 mg/dL (ref <130)
Total CHOL/HDL Ratio: 3.1 (calc) (ref <5.0)
Triglycerides: 153 mg/dL — ABNORMAL HIGH (ref <150)

## 2022-11-21 DIAGNOSIS — H353132 Nonexudative age-related macular degeneration, bilateral, intermediate dry stage: Secondary | ICD-10-CM | POA: Diagnosis not present

## 2022-12-21 DIAGNOSIS — H353132 Nonexudative age-related macular degeneration, bilateral, intermediate dry stage: Secondary | ICD-10-CM | POA: Diagnosis not present

## 2023-01-20 DIAGNOSIS — H353132 Nonexudative age-related macular degeneration, bilateral, intermediate dry stage: Secondary | ICD-10-CM | POA: Diagnosis not present

## 2023-02-19 DIAGNOSIS — H353132 Nonexudative age-related macular degeneration, bilateral, intermediate dry stage: Secondary | ICD-10-CM | POA: Diagnosis not present

## 2023-02-19 NOTE — Progress Notes (Signed)
FOLLOW UP   Assessment:   Essential hypertension Well controlled with current medications- Amlodipine 5 mg every day and hydrochlorothiazide 25 mg every day  Monitor blood pressure at home; call if consistently over 130/80 Continue DASH diet.   Reminder to go to the ER if any CP, SOB, nausea, dizziness, severe HA, changes vision/speech, left arm numbness and tingling and jaw pain.  Osteoporosis, unspecified osteoporosis type, unspecified pathological fracture presence Continue Vit D and weight bearing exercises Declines further DEXA- would not do treatment  CKD Stage III  (GFR 30-59 ml/min) Increase fluids, avoid NSAIDS, monitor sugars, will monitor closely She prefers to avoid nephrology referral unless absolutely necessary   Anemia r/t CKD III(HCC) Monitor CBC  Hyperlipidemia, unspecified hyperlipidemia type Continue rosuvastatin 10 mg every day  Discussed dietary and exercise modifications Low fat diet Lipid panel  Abnormal glucose Discussed disease and risks Discussed diet/exercise, weight management  A1C q69m CMP  Vitamin D deficiency Continue supplement Check level annually and PRN  Primary osteoarthritis involving multiple joints Tylenol, avoid oral NSAIDs Continue Voltaren gel QID as needed Follow up ortho PRN, states currently managing   Intermediate stage nonexudative age-related macular degeneration of both eyes.  Follows with Dr. Elmer Picker Uses [facility] machine at home and Dr. Elmer Picker can see readings.   Medication management -     CBC with Differential/Platelet -     COMPLETE METABOLIC PANEL WITH GFR -     Lipid panel -     Hemoglobin A1C w/out eAG -     TSH    Further disposition pending results if labs check today. Discussed med's effects and SE's.   Over 30 minutes of face to face interview, exam, counseling, chart review, and critical decision making was performed.   Future Appointments  Date Time Provider Department Center  02/20/2023  10:30 AM Raynelle Dick, NP [facility] None  04/14/2023 11:00 AM Raynelle Dick, NP [facility] None  08/13/2023 10:00 AM Lucky Cowboy, MD [facility] None      Subjective:   Ann [name] is a 89 y.o. pleasant female who presents for follow up. She has Essential hypertension; Hyperlipidemia, mixed; Vitamin D deficiency; Degenerative joint disease; Anemia of chronic renal failure, stage 3 (moderate) (HCC); Medication management; Other abnormal glucose (prediabetes); Osteoporosis; Vitamin B12 deficiency; CKD (chronic kidney disease) stage 3, GFR 30-59 ml/min (HCC); Urinary incontinence; and Hyperkalemia on their problem list.  She reports would not want cancer treatment and declines further screening.   Reports she is taking tylenol intermittently for her bilateral knees and L shoulder arthritis, has seen ortho. She also uses diclofenac gel as needed. She will take Tylenol as needed. Also using CBD oil which is helping   BMI is Body mass index is 22.75 kg/m., she has been working on diet and exercise, rides stationary bike 20 min intermittently, does daily stretching/strength exercises. She is very active and works in her yard and garden, currently very active with raking and Firefighter. She has switched to [employer] bread.  Wt Readings from Last 3 Encounters:  02/20/23 121 lb 6.4 oz (55.1 kg)  11/14/22 119 lb (54 kg)  08/08/22 120 lb 9.6 oz (54.7 kg)   Her blood pressure has been controlled at home with amlodipine 5 mg every day and HCTZ 25 mg every day , today their BP is BP: 128/64   BP Readings from Last 3 Encounters:  02/20/23 128/64  11/14/22 126/66  08/08/22 130/62  She does workout. She denies chest pain, shortness  of breath, dizziness.    She is on cholesterol medication (Rosuvastatin 10 mg daily) and denies myalgias. Her cholesterol is not at goal, would like LDL < 70. Both parents ? Had CVA later in life, she wishes to continue on ASA (takes every other day). The  cholesterol last visit was:   Lab Results  Component Value Date   CHOL 177 11/14/2022   HDL 58 11/14/2022   LDLCALC 94 11/14/2022   TRIG 153 (H) 11/14/2022   CHOLHDL 3.1 11/14/2022    She has been working on diet and exercise for prediabetes, and denies increased appetite, nausea, paresthesia of the feet, polydipsia, polyuria and visual disturbances. Last A1C in the office was:  Lab Results  Component Value Date   HGBA1C 6.1 (H) 08/08/2022    She tries to push fluids throughout the day. She has CKD IIIa monitored at this office:  Lab Results  Component Value Date   EGFR 45 (L) 11/14/2022   EGFR 43 (L) 08/08/2022   EGFR 42 (L) 04/24/2022   She has been followed at [facility] for intermediate stage nonexudative age-related macular degeneration of both eyes.   She has chronic mild anemia, of chronic disease, stable:     Latest Ref Rng & Units 11/14/2022   11:11 AM 08/08/2022   11:16 AM 04/24/2022   12:00 AM  CBC  WBC 3.8 - 10.8 Thousand/uL 5.5  6.4  6.6   Hemoglobin 11.7 - 15.5 g/dL 16.1  09.6  04.5   Hematocrit 35.0 - 45.0 % 35.7  35.7  36.1   Platelets 140 - 400 Thousand/uL 226  222  227    Lab Results  Component Value Date   IRON 68 11/14/2022   TIBC 357 11/14/2022   FERRITIN 99 11/14/2022    She takes B12 SL, unsure of dose, reports has reduced frequency taking once a week Lab Results  Component Value Date   VITAMINB12 1,715 (H) 08/08/2022   Patient is on Vitamin D supplement.   Lab Results  Component Value Date   VD25OH 43 08/08/2022        Medication Review Current Outpatient Medications on File Prior to Visit  Medication Sig Dispense Refill   amLODipine (NORVASC) 5 MG tablet Take 1 tablet daily in the morning for blood pressure. 90 tablet 3   aspirin 81 MG tablet Take 81 mg by mouth daily.     Cholecalciferol (VITAMIN D3) 50 MCG (2000 UT) TABS Taking 6000 units daily (Patient taking differently: 5,000 Units. Taking 6000 units daily) 30 tablet     Cyanocobalamin (VITAMIN B-12 PO) Take 1 tablet by mouth once a week.     diclofenac Sodium (VOLTAREN) 1 % GEL Apply 4 g topically 4 (four) times daily. 100 g 3   fish oil-omega-3 fatty acids 1000 MG capsule Take 2 g by mouth daily. 1400 mg     hydrochlorothiazide (HYDRODIURIL) 25 MG tablet TAKE 1 TABLET DAILY FOR BLOOD PRESSURE AND FLUID RETENTION 90 tablet 3   OVER THE COUNTER MEDICATION Takes vitamin for her eyes 1 daily     rosuvastatin (CRESTOR) 10 MG tablet Take  1 tablet  Daily  for Cholesterol. 90 tablet 3   No current facility-administered medications on file prior to visit.    Current Problems (verified) Patient Active Problem List   Diagnosis Date Noted   Hyperkalemia 06/04/2021   Urinary incontinence 05/23/2019   CKD (chronic kidney disease) stage 3, GFR 30-59 ml/min (HCC) 05/20/2019   Vitamin B12 deficiency 12/06/2017  Osteoporosis 10/15/2015   Other abnormal glucose (prediabetes) 01/01/2014   Medication management 06/27/2013   Essential hypertension    Hyperlipidemia, mixed    Vitamin D deficiency    Degenerative joint disease    Anemia of chronic renal failure, stage 3 (moderate) (HCC)      Names of Other Physician/Practitioners you currently use: 1. [facility]- here for primary care 2. Dr. Elmer Picker, eye doctor, last visit last visit 12/21/22 3. Dr. Alvester Morin, dentist, last visit 2024  Patient Care Team: Lucky Cowboy, MD as PCP - General (Internal Medicine)  Allergies Allergies  Allergen Reactions   Ace Inhibitors     Hyperkalemia    Angiotensin Receptor Blockers     Hyperkalemia    SURGICAL HISTORY She  has a past surgical history that includes Cataract extraction (Bilateral). FAMILY HISTORY Her family history includes Breast cancer in her daughter; CVA in her father and mother; Cerebral aneurysm in her sister. SOCIAL HISTORY She  reports that she has never smoked. She has never used smokeless tobacco. She reports  current alcohol use of about 1.0 standard drink of alcohol per week. She reports that she does not use drugs.       Review of Systems  Constitutional:  Negative for malaise/fatigue and weight loss.  HENT:  Positive for tinnitus. Negative for hearing loss.   Eyes:  Negative for blurred vision and double vision.  Respiratory:  Negative for cough, shortness of breath and wheezing.   Cardiovascular:  Negative for chest pain, palpitations, orthopnea, claudication and leg swelling.  Gastrointestinal:  Positive for constipation (intermittent). Negative for abdominal pain, blood in stool, diarrhea, heartburn, melena, nausea and vomiting.  Genitourinary: Negative.   Musculoskeletal:  Positive for joint pain (knees, R shoulder; chronic, mild). Negative for myalgias.  Skin:  Negative for rash.  Neurological:  Negative for dizziness, tingling, sensory change, weakness and headaches.  Endo/Heme/Allergies:  Negative for polydipsia.  Psychiatric/Behavioral: Negative.    All other systems reviewed and are negative.    Objective:   Today's Vitals   02/20/23 1021  BP: 128/64  Pulse: 71  Temp: (!) 97.4 F (36.3 C)  SpO2: 98%  Weight: 121 lb 6.4 oz (55.1 kg)  Height: 5' 1.25" (1.556 m)      Body mass index is 22.75 kg/m.  General appearance: very pleasant well dressed female in no apparent distress HEENT: normocephalic, sclerae anicteric; TMs clear, non-erythematous, non-bulging; nares patent, no discharge or erythema, pharynx normal Oral cavity: MMM, no lesions Neck: supple, no lymphadenopathy, no thyromegaly, no masses Heart: RRR, normal S1, S2, no murmurs Lungs: CTA bilaterally, no wheezes, rhonchi, or rales Abdomen: +bs, soft, non tender, non distended, no masses, no hepatomegaly, no splenomegaly Musculoskeletal: nontender, no swelling, no obvious deformity. She has limited ROM and pain to L shoulder, abduction past 90 degrees is limited (per patient ongoing for multiple years,  declined surgery)  Extremities: no edema, no cyanosis, no clubbing  Pulses: 2+ symmetric, upper and lower extremities, normal cap refill Neurological: alert, oriented x 3, CN2-12 intact, strength normal upper extremities and lower extremities, sensation normal throughout, DTRs 2+ throughout, no cerebellar signs, gait normal Psychiatric: normal affect, behavior normal, pleasant     Raynelle Dick, NP   02/20/2023

## 2023-02-20 ENCOUNTER — Ambulatory Visit (INDEPENDENT_AMBULATORY_CARE_PROVIDER_SITE_OTHER): Payer: PPO | Admitting: Nurse Practitioner

## 2023-02-20 ENCOUNTER — Encounter: Payer: Self-pay | Admitting: Nurse Practitioner

## 2023-02-20 VITALS — BP 128/64 | HR 71 | Temp 97.4°F | Ht 61.25 in | Wt 121.4 lb

## 2023-02-20 DIAGNOSIS — R7309 Other abnormal glucose: Secondary | ICD-10-CM

## 2023-02-20 DIAGNOSIS — I10 Essential (primary) hypertension: Secondary | ICD-10-CM | POA: Diagnosis not present

## 2023-02-20 DIAGNOSIS — E559 Vitamin D deficiency, unspecified: Secondary | ICD-10-CM | POA: Diagnosis not present

## 2023-02-20 DIAGNOSIS — H353132 Nonexudative age-related macular degeneration, bilateral, intermediate dry stage: Secondary | ICD-10-CM | POA: Diagnosis not present

## 2023-02-20 DIAGNOSIS — E782 Mixed hyperlipidemia: Secondary | ICD-10-CM | POA: Diagnosis not present

## 2023-02-20 DIAGNOSIS — D631 Anemia in chronic kidney disease: Secondary | ICD-10-CM | POA: Diagnosis not present

## 2023-02-20 DIAGNOSIS — Z79899 Other long term (current) drug therapy: Secondary | ICD-10-CM

## 2023-02-20 DIAGNOSIS — N1831 Chronic kidney disease, stage 3a: Secondary | ICD-10-CM | POA: Diagnosis not present

## 2023-02-20 DIAGNOSIS — M150 Primary generalized (osteo)arthritis: Secondary | ICD-10-CM

## 2023-02-20 DIAGNOSIS — M810 Age-related osteoporosis without current pathological fracture: Secondary | ICD-10-CM

## 2023-02-20 DIAGNOSIS — N1832 Chronic kidney disease, stage 3b: Secondary | ICD-10-CM

## 2023-02-20 NOTE — Patient Instructions (Signed)

## 2023-02-21 LAB — COMPLETE METABOLIC PANEL WITH GFR
AG Ratio: 1.6 (calc) (ref 1.0–2.5)
ALT: 9 U/L (ref 6–29)
AST: 18 U/L (ref 10–35)
Albumin: 4.4 g/dL (ref 3.6–5.1)
Alkaline phosphatase (APISO): 62 U/L (ref 37–153)
BUN/Creatinine Ratio: 20 (calc) (ref 6–22)
BUN: 23 mg/dL (ref 7–25)
CO2: 27 mmol/L (ref 20–32)
Calcium: 10.4 mg/dL (ref 8.6–10.4)
Chloride: 99 mmol/L (ref 98–110)
Creat: 1.16 mg/dL — ABNORMAL HIGH (ref 0.60–0.95)
Globulin: 2.8 g/dL (ref 1.9–3.7)
Glucose, Bld: 118 mg/dL — ABNORMAL HIGH (ref 65–99)
Potassium: 4.3 mmol/L (ref 3.5–5.3)
Sodium: 138 mmol/L (ref 135–146)
Total Bilirubin: 0.5 mg/dL (ref 0.2–1.2)
Total Protein: 7.2 g/dL (ref 6.1–8.1)
eGFR: 45 mL/min/{1.73_m2} — ABNORMAL LOW (ref >=60)

## 2023-02-21 LAB — CBC WITH DIFFERENTIAL/PLATELET
Absolute Lymphocytes: 1467 {cells}/uL (ref 850–3900)
Absolute Monocytes: 431 {cells}/uL (ref 200–950)
Basophils Absolute: 39 {cells}/uL (ref 0–200)
Basophils Relative: 0.7 %
Eosinophils Absolute: 50 {cells}/uL (ref 15–500)
Eosinophils Relative: 0.9 %
HCT: 35.6 % (ref 35.0–45.0)
Hemoglobin: 11.2 g/dL — ABNORMAL LOW (ref 11.7–15.5)
MCH: 27 pg (ref 27.0–33.0)
MCHC: 31.5 g/dL — ABNORMAL LOW (ref 32.0–36.0)
MCV: 85.8 fL (ref 80.0–100.0)
MPV: 12.1 fL (ref 7.5–12.5)
Monocytes Relative: 7.7 %
Neutro Abs: 3612 {cells}/uL (ref 1500–7800)
Neutrophils Relative %: 64.5 %
Platelets: 257 10*3/uL (ref 140–400)
RBC: 4.15 10*6/uL (ref 3.80–5.10)
RDW: 13.4 % (ref 11.0–15.0)
Total Lymphocyte: 26.2 %
WBC: 5.6 10*3/uL (ref 3.8–10.8)

## 2023-02-21 LAB — LIPID PANEL
Cholesterol: 180 mg/dL (ref <200)
HDL: 59 mg/dL (ref >=50)
LDL Cholesterol (Calc): 97 mg/dL
Non-HDL Cholesterol (Calc): 121 mg/dL (ref <130)
Total CHOL/HDL Ratio: 3.1 (calc) (ref <5.0)
Triglycerides: 142 mg/dL (ref <150)

## 2023-02-21 LAB — HEMOGLOBIN A1C W/OUT EAG: Hgb A1c MFr Bld: 6.1 %{Hb} — ABNORMAL HIGH (ref <5.7)

## 2023-02-21 LAB — TSH: TSH: 2.62 m[IU]/L (ref 0.40–4.50)

## 2023-03-10 ENCOUNTER — Ambulatory Visit (INDEPENDENT_AMBULATORY_CARE_PROVIDER_SITE_OTHER): Payer: PPO | Admitting: Nurse Practitioner

## 2023-03-10 ENCOUNTER — Encounter: Payer: Self-pay | Admitting: Nurse Practitioner

## 2023-03-10 VITALS — BP 136/70 | HR 74 | Temp 97.7°F | Ht 61.25 in | Wt 121.6 lb

## 2023-03-10 DIAGNOSIS — R1013 Epigastric pain: Secondary | ICD-10-CM | POA: Diagnosis not present

## 2023-03-10 DIAGNOSIS — I10 Essential (primary) hypertension: Secondary | ICD-10-CM

## 2023-03-10 MED ORDER — FAMOTIDINE 20 MG PO TABS: 20.0000 mg | ORAL_TABLET | Freq: Two times a day (BID) | ORAL | Status: AC

## 2023-03-10 NOTE — Patient Instructions (Addendum)
Generic zyrtec or allegra for allergies  Can use Pepcid(famotidine) 20 mg twice a day, if samples improve let me know and I can send in a prescription to the pharmacy to take daily  Food Choices for Gastroesophageal Reflux Disease, Adult When you have gastroesophageal reflux disease (GERD), the foods you eat and your eating habits are very important. Choosing the right foods can help ease your discomfort. Think about working with a food expert (dietitian) to help you make good choices. What are tips for following this plan? Reading food labels Look for foods that are low in saturated fat. Foods that may help with your symptoms include: Foods that have less than 5% of daily value (DV) of fat. Foods that have 0 grams of trans fat. Cooking Do not fry your food. Cook your food by baking, steaming, grilling, or broiling. These are all methods that do not need a lot of fat for cooking. To add flavor, try to use herbs that are low in spice and acidity. Meal planning  Choose healthy foods that are low in fat, such as: Fruits and vegetables. Whole grains. Low-fat dairy products. Lean meats, fish, and poultry. Eat small meals often instead of eating 3 large meals each day. Eat your meals slowly in a place where you are relaxed. Avoid bending over or lying down until 2-3 hours after eating. Limit high-fat foods such as fatty meats or fried foods. Limit your intake of fatty foods, such as oils, butter, and shortening. Avoid the following as told by your doctor: Foods that cause symptoms. These may be different for different people. Keep a food diary to keep track of foods that cause symptoms. Alcohol. Drinking a lot of liquid with meals. Eating meals during the 2-3 hours before bed. Lifestyle Stay at a healthy weight. Ask your doctor what weight is healthy for you. If you need to lose weight, work with your doctor to do so safely. Exercise for at least 30 minutes on 5 or more days each week, or  as told by your doctor. Wear loose-fitting clothes. Do not smoke or use any products that contain nicotine or tobacco. If you need help quitting, ask your doctor. Sleep with the head of your bed higher than your feet. Use a wedge under the mattress or blocks under the bed frame to raise the head of the bed. Chew sugar-free gum after meals. What foods should eat?  Eat a healthy, well-balanced diet of fruits, vegetables, whole grains, low-fat dairy products, lean meats, fish, and poultry. Each person is different. Foods that may cause symptoms in one person may not cause any symptoms in another person. Work with your doctor to find foods that are safe for you. The items listed above may not be a complete list of what you can eat and drink. Contact a food expert for more options. What foods should I avoid? Limiting some of these foods may help in managing the symptoms of GERD. Everyone is different. Talk with a food expert or your doctor to help you find the exact foods to avoid, if any. Fruits Any fruits prepared with added fat. Any fruits that cause symptoms. For some people, this may include citrus fruits, such as oranges, grapefruit, pineapple, and lemons. Vegetables Deep-fried vegetables. [place] fries. Any vegetables prepared with added fat. Any vegetables that cause symptoms. For some people, this may include tomatoes and tomato products, chili peppers, onions and garlic, and horseradish. Grains Pastries or quick breads with added fat. Meats and other proteins  High-fat meats, such as fatty beef or pork, hot dogs, ribs, ham, sausage, salami, and bacon. Fried meat or protein, including fried fish and fried chicken. Nuts and nut butters, in large amounts. Dairy Whole milk and chocolate milk. Sour cream. Cream. Ice cream. Cream cheese. Milkshakes. Fats and oils Butter. Margarine. Shortening. Ghee. Beverages Coffee and tea, with or without caffeine. Carbonated beverages. Sodas. Energy drinks.  Fruit juice made with acidic fruits, such as orange or grapefruit. Tomato juice. Alcoholic drinks. Sweets and desserts Chocolate and cocoa. Donuts. Seasonings and condiments Pepper. Peppermint and spearmint. Added salt. Any condiments, herbs, or seasonings that cause symptoms. For some people, this may include curry, hot sauce, or vinegar-based salad dressings. The items listed above may not be a complete list of what you should not eat and drink. Contact a food expert for more options. Questions to ask your doctor Diet and lifestyle changes are often the first steps that are taken to manage symptoms of GERD. If diet and lifestyle changes do not help, talk with your doctor about taking medicines. Where to find more information [employer]: aboutgerd.org Summary When you have GERD, food and lifestyle choices are very important in easing your symptoms. Eat small meals often instead of 3 large meals a day. Eat your meals slowly and in a place where you are relaxed. Avoid bending over or lying down until 2-3 hours after eating. Limit high-fat foods such as fatty meats or fried foods. This information is not intended to replace advice given to you by your health care provider. Make sure you discuss any questions you have with your health care provider. Document Revised: 09/26/2019 Document Reviewed: 09/26/2019 Elsevier Patient Education  2024 [employer].

## 2023-03-10 NOTE — Progress Notes (Signed)
Assessment and Plan:  Ann [name] was seen today for acute visit.  Diagnoses and all orders for this visit:  Essential hypertension - continue medications- Losartan 50 mg every day , DASH diet, exercise and monitor at home. Call if greater than 130/80.  - Go to the ER if any chest pain, shortness of breath, nausea, dizziness, severe HA, changes vision/speech   Epigastric pain Start pepcid 20 mg BID- given samples, if works well will send in prescription Avoid spicy and fatty foods Monitor symptoms and if chest pain worsens, becomes crushing or like heavy weight on chest, difficulty breathing she is to go to the ER -     EKG 12-Lead -     famotidine (PEPCID) 20 MG tablet; Take 1 tablet (20 mg total) by mouth 2 (two) times daily.      Further disposition pending results of labs. Discussed med's effects and SE's.   Over 30 minutes of exam, counseling, chart review, and critical decision making was performed.   Future Appointments  Date Time Provider Department Center  05/29/2023 10:30 AM Raynelle Dick, NP [facility] None  09/18/2023 10:00 AM Lucky Cowboy, MD [facility] None    ------------------------------------------------------------------------------------------------------------------   HPI BP 136/70   Pulse 74   Temp 97.7 F (36.5 C)   Ht 5' 1.25" (1.556 m)   Wt 121 lb 9.6 oz (55.2 kg)   SpO2 98%   BMI 22.79 kg/m   89 y.o.female presents for chest pain which she describes as a burning sensation and has been present for 1 week.  She does take TUMS but does not relieve pain but did slightly improve her pain.  States she has had similar symptoms in the past with eating something with a strong mint flavor.  Uncertain of eating anything spicy prior to this episode . Denies arm, jaw, back pain, shortness of breath, visual changes and dizziness.   BP well controlled with Amlodipine 5 mg every day and hydrochlorothiazide 25 mg every day  BP Readings from Last 3 Encounters:   03/10/23 136/70  02/20/23 128/64  11/14/22 126/66  Denies headaches, shortness of breath and dizziness   BMI is Body mass index is 22.79 kg/m., she has been working on diet and exercise. Wt Readings from Last 3 Encounters:  03/10/23 121 lb 9.6 oz (55.2 kg)  02/20/23 121 lb 6.4 oz (55.1 kg)  11/14/22 119 lb (54 kg)     Past Medical History:  Diagnosis Date   Anemia of chronic renal failure    Chronic renal insufficiency    Degenerative joint disease    HTN (hypertension)    Hyperlipidemia    Shingles    Vitamin D deficiency      Allergies  Allergen Reactions   Ace Inhibitors     Hyperkalemia    Angiotensin Receptor Blockers     Hyperkalemia    Current Outpatient Medications on File Prior to Visit  Medication Sig   amLODipine (NORVASC) 5 MG tablet Take 1 tablet daily in the morning for blood pressure.   aspirin 81 MG tablet Take 81 mg by mouth daily.   Cholecalciferol (VITAMIN D3) 50 MCG (2000 UT) TABS Taking 6000 units daily (Patient taking differently: 5,000 Units. Taking 6000 units daily)   Cyanocobalamin (VITAMIN B-12 PO) Take 1 tablet by mouth once a week.   diclofenac Sodium (VOLTAREN) 1 % GEL Apply 4 g topically 4 (four) times daily.   fish oil-omega-3 fatty acids 1000 MG capsule Take 2 g by mouth daily. 1400  mg   hydrochlorothiazide (HYDRODIURIL) 25 MG tablet TAKE 1 TABLET DAILY FOR BLOOD PRESSURE AND FLUID RETENTION   OVER THE COUNTER MEDICATION Takes vitamin for her eyes 1 daily   rosuvastatin (CRESTOR) 10 MG tablet Take  1 tablet  Daily  for Cholesterol.   No current facility-administered medications on file prior to visit.    ROS: all negative except above.   Physical Exam:  BP 136/70   Pulse 74   Temp 97.7 F (36.5 C)   Ht 5' 1.25" (1.556 m)   Wt 121 lb 9.6 oz (55.2 kg)   SpO2 98%   BMI 22.79 kg/m   General Appearance: Well nourished, in no apparent distress. Eyes: PERRLA, EOMs, conjunctiva no swelling or erythema Neck: Supple, thyroid  normal.  Respiratory: Respiratory effort normal, BS equal bilaterally without rales, rhonchi, wheezing or stridor.  Cardio: RRR with no MRGs. Brisk peripheral pulses without edema.  Abdomen: Soft, + BS.  Non tender, no guarding, rebound, hernias, masses. Lymphatics: Non tender without lymphadenopathy.  Musculoskeletal: Full ROM, 5/5 strength, normal gait.  Skin: Warm, dry without rashes, lesions, ecchymosis.  Neuro: Cranial nerves intact. Normal muscle tone, no cerebellar symptoms. Sensation intact.  Psych: Awake and oriented X 3, normal affect, Insight and Judgment appropriate.  EKG:   Raynelle Dick, NP 3:14 PM [facility]

## 2023-03-20 ENCOUNTER — Telehealth: Payer: Self-pay | Admitting: Nurse Practitioner

## 2023-03-20 ENCOUNTER — Other Ambulatory Visit: Payer: Self-pay | Admitting: Nurse Practitioner

## 2023-03-20 DIAGNOSIS — R1013 Epigastric pain: Secondary | ICD-10-CM

## 2023-03-20 MED ORDER — PANTOPRAZOLE SODIUM 40 MG PO TBEC: 40.0000 mg | DELAYED_RELEASE_TABLET | Freq: Every day | ORAL | 1 refills | Status: AC

## 2023-03-20 NOTE — Telephone Encounter (Signed)
Please tell patient I have sent in Protonix and I would like her to take 1 in am and take famotidine at night  If pain persists please schedule appointment

## 2023-03-20 NOTE — Telephone Encounter (Signed)
Patient was seen on 12/10. You put her on Pepcid. She said she has not gotten better. Could you possibly send in something else or does she need to come back in the office? Pharm--[facility] [id number] [place], [place] - [phone]

## 2023-03-20 NOTE — Telephone Encounter (Signed)
Patient advised.

## 2023-03-21 DIAGNOSIS — H353132 Nonexudative age-related macular degeneration, bilateral, intermediate dry stage: Secondary | ICD-10-CM | POA: Diagnosis not present

## 2023-04-14 ENCOUNTER — Ambulatory Visit: Payer: PPO | Admitting: Nurse Practitioner

## 2023-05-14 ENCOUNTER — Other Ambulatory Visit: Payer: Self-pay

## 2023-05-14 DIAGNOSIS — I10 Essential (primary) hypertension: Secondary | ICD-10-CM

## 2023-05-14 DIAGNOSIS — E782 Mixed hyperlipidemia: Secondary | ICD-10-CM

## 2023-05-14 MED ORDER — AMLODIPINE BESYLATE 5 MG PO TABS: ORAL_TABLET | ORAL | 0 refills | Status: AC

## 2023-05-14 MED ORDER — ROSUVASTATIN CALCIUM 10 MG PO TABS: ORAL_TABLET | ORAL | 0 refills | Status: AC

## 2023-05-29 ENCOUNTER — Ambulatory Visit: Payer: PPO | Admitting: Nurse Practitioner

## 2023-08-13 ENCOUNTER — Encounter: Payer: PPO | Admitting: Internal Medicine

## 2023-09-18 ENCOUNTER — Encounter: Payer: PPO | Admitting: Internal Medicine
